# Patient Record
Sex: Female | Born: 1970
Health system: Southern US, Community
[De-identification: ages and names within clinical notes are randomized; demographics above are authoritative.]

## PROBLEM LIST (undated history)

## (undated) DIAGNOSIS — F32A Depression, unspecified: Secondary | ICD-10-CM

## (undated) DIAGNOSIS — K219 Gastro-esophageal reflux disease without esophagitis: Secondary | ICD-10-CM

## (undated) DIAGNOSIS — J45909 Unspecified asthma, uncomplicated: Secondary | ICD-10-CM

## (undated) DIAGNOSIS — T7840XA Allergy, unspecified, initial encounter: Secondary | ICD-10-CM

## (undated) DIAGNOSIS — F329 Major depressive disorder, single episode, unspecified: Secondary | ICD-10-CM

## (undated) DIAGNOSIS — I10 Essential (primary) hypertension: Secondary | ICD-10-CM

## (undated) HISTORY — DX: Major depressive disorder, single episode, unspecified: F32.9

## (undated) HISTORY — DX: Essential (primary) hypertension: I10

## (undated) HISTORY — DX: Gastro-esophageal reflux disease without esophagitis: K21.9

## (undated) HISTORY — DX: Depression, unspecified: F32.A

## (undated) HISTORY — DX: Allergy, unspecified, initial encounter: T78.40XA

## (undated) HISTORY — PX: APPENDECTOMY: SHX54

## (undated) HISTORY — PX: TONSILLECTOMY: SUR1361

## (undated) HISTORY — DX: Unspecified asthma, uncomplicated: J45.909

---

## 2007-03-01 ENCOUNTER — Inpatient Hospital Stay (HOSPITAL_COMMUNITY): Admission: RE | Admit: 2007-03-01 | Discharge: 2007-03-04 | Payer: Self-pay | Admitting: Obstetrics & Gynecology

## 2007-03-02 ENCOUNTER — Encounter (INDEPENDENT_AMBULATORY_CARE_PROVIDER_SITE_OTHER): Payer: Self-pay | Admitting: Specialist

## 2011-03-07 NOTE — Op Note (Signed)
Terri Richardson, Terri Richardson                ACCOUNT NO.:  [id number]   MEDICAL RECORD NO.:  [id number]          PATIENT TYPE:  INP   LOCATION:  9163                          FACILITY:  [facility]   PHYSICIAN:  Genia Del, M.D.DATE OF BIRTH:  1971-06-15   DATE OF PROCEDURE:  DATE OF DISCHARGE:                               OPERATIVE REPORT   PREOPERATIVE DIAGNOSES:  1. Failure to descend.  2. Nonreassuring fetal heart rate at 39 weeks' gestation.  3. Diabetes mellitus type 2 on glyburide.   POSTOPERATIVE DIAGNOSES:  1. Failure to descend.  2. Nonreassuring fetal heart rate at 39 weeks' gestation.  3. Diabetes mellitus type 2 on glyburide.  4. Presentation posterior occiput.   INTERVENTION:  Urgent primary low transverse cesarean section under  epidural.   SURGEON:  Dr. Genia Del.   ANESTHESIOLOGIST:  Dr. Arby Barrette.   PROCEDURE:  Under epidural anesthesia, the patient is in 15-degree left  decubitus position.  She is prepped with Betadine on the abdominal,  suprapubic and vulvar areas.  The bladder catheter is already in place,  and the patient is draped as usual.  Infiltration of Marcaine one-  quarter plain is done at the site of the incision.  We make a  Pfannenstiel incision with the scalpel and opened the adipose tissue  with a scalpel.  We opened the aponeurosis with Mayo scissors  transversely.  We detached the recti muscles from the aponeurosis on the  midline.  We then opened the parietal peritoneum longitudinally with  [facility] scissors.  We put the bladder retractor in place.  We opened the  visceral peritoneum transversely over the lower uterine segment and  reclined the bladder downward.  We reposition the bladder retractor.  We  then make a low transverse hysterotomy with a scalpel, extension on each  side with dressing scissors.  The fetus is in cephalic presentation.  The amniotic fluid is clear.  Birth of a baby boy at 2:59.  The cord is  clamped and cut.  The  baby is suctioned and given to the neonatal team.  Apgars are 9 and 9; pH is 7.33; the weight is 7 pounds 8 ounces.  We  evacuate the placenta spontaneously.  The patient is a cord blood donor.  The pH is done.  We then do a uterine revision.  We start the Pitocin in  the IV fluids, and a dose of Ancef 1 gram IV is given.  The uterus  contracts well.  Both ovaries and both tubes are normal to inspection.  We close the hysterotomy with a first locked running layer of Vicryl 0.  A small extension on the inferior right aspect of the incision was  repaired with a locked running suture of Vicryl 0.  We do a second layer  in a mattress fashion with Vicryl 0.  Hemostasis is good.  We irrigate  and suction the abdominopelvic cavity.  We then complete hemostasis on  the recti muscles where necessary.  We close the aponeurosis in two half  running sutures of Vicryl 0.  We complete  hemostasis on the adipose  tissue and reapproximate the skin with  staples.  The counts of sponges and instruments is complete x2.  A dry  dressing is applied.  The estimated blood loss is 600 mL.  No  complications occurred, and the patient is brought to recovery room in  good stable status.      Genia Del, M.D.  Electronically Signed     ML/MEDQ  D:  03/02/2007  T:  03/02/2007  Job:  [id number]

## 2011-03-10 NOTE — Discharge Summary (Signed)
Terri Richardson, Terri Richardson                ACCOUNT NO.:  [id number]   MEDICAL RECORD NO.:  [id number]          PATIENT TYPE:  INP   LOCATION:  9113                          FACILITY:  [facility]   PHYSICIAN:  Genia Del, M.D.DATE OF BIRTH:  Feb 08, 1971   DATE OF ADMISSION:  03/01/2007  DATE OF DISCHARGE:  03/04/2007                               DISCHARGE SUMMARY   ADMISSION DIAGNOSES:  G1, 38 weeks and 6 days gestation with gestational  diabetes mellitus on Glyburide. Induction with Pitocin and rupture of  membranes. Failure to progress at 6 to 7 cm.   DISCHARGE DIAGNOSES:  G1, 38 weeks and 6 days gestation with gestational  diabetes mellitus on Glyburide. Induction with Pitocin and rupture of  membranes. Failure to progress at 6 to 7 cm.   Birth of a baby boy, 7 pounds and 8 ounces. No complications with the  cesarean section. The estimated blood loss was 600 mL. The patient  remained hemodynamically stable and afebrile. She was discharged home on  postoperative day #2. She was in good stable status. Postoperative  advice was given. She will follow up at the office to remove the staples  and then at 6 weeks postoperatively. Pain medications were prescribed,  and her blood sugars post cesarean section were within normal range.      Genia Del, M.D.  Electronically Signed     ML/MEDQ  D:  04/16/2007  T:  04/16/2007  Job:  [id number]

## 2011-11-10 ENCOUNTER — Inpatient Hospital Stay (HOSPITAL_COMMUNITY)
Admission: EM | Admit: 2011-11-10 | Discharge: 2011-11-12 | DRG: 419 | Disposition: A | Discharge status: Home / Self Care | Payer: Medicaid Other | Attending: General Surgery | Admitting: General Surgery

## 2011-11-10 ENCOUNTER — Encounter (HOSPITAL_COMMUNITY): Payer: Self-pay | Admitting: *Deleted

## 2011-11-10 ENCOUNTER — Emergency Department (HOSPITAL_COMMUNITY): Payer: Medicaid Other

## 2011-11-10 DIAGNOSIS — K819 Cholecystitis, unspecified: Secondary | ICD-10-CM

## 2011-11-10 DIAGNOSIS — K859 Acute pancreatitis without necrosis or infection, unspecified: Secondary | ICD-10-CM

## 2011-11-10 DIAGNOSIS — Z87891 Personal history of nicotine dependence: Secondary | ICD-10-CM

## 2011-11-10 DIAGNOSIS — E119 Type 2 diabetes mellitus without complications: Secondary | ICD-10-CM | POA: Diagnosis present

## 2011-11-10 DIAGNOSIS — K8000 Calculus of gallbladder with acute cholecystitis without obstruction: Principal | ICD-10-CM | POA: Diagnosis present

## 2011-11-10 DIAGNOSIS — K810 Acute cholecystitis: Secondary | ICD-10-CM | POA: Diagnosis present

## 2011-11-10 LAB — URINALYSIS, ROUTINE W REFLEX MICROSCOPIC
Bilirubin Urine: NEGATIVE
Glucose, UA: NEGATIVE mg/dL
Ketones, ur: >80 mg/dL — AB
Leukocytes, UA: NEGATIVE
Nitrite: NEGATIVE
Specific Gravity, Urine: 1.028 (ref 1.005–1.030)
pH: 6 (ref 5.0–8.0)

## 2011-11-10 LAB — COMPREHENSIVE METABOLIC PANEL
Albumin: 4.1 g/dL (ref 3.5–5.2)
Alkaline Phosphatase: 69 U/L (ref 39–117)
BUN: 9 mg/dL (ref 6–23)
Creatinine, Ser: 0.79 mg/dL (ref 0.50–1.10)
GFR calc Af Amer: >90 mL/min (ref >90)
Glucose, Bld: 125 mg/dL — ABNORMAL HIGH (ref 70–99)
Total Bilirubin: 0.8 mg/dL (ref 0.3–1.2)
Total Protein: 8.5 g/dL — ABNORMAL HIGH (ref 6.0–8.3)

## 2011-11-10 LAB — CBC
HCT: 43 % (ref 36.0–46.0)
Hemoglobin: 14.9 g/dL (ref 12.0–15.0)
MCH: 31.2 pg (ref 26.0–34.0)
MCHC: 34.7 g/dL (ref 30.0–36.0)
MCV: 90.1 fL (ref 78.0–100.0)
RBC: 4.77 MIL/uL (ref 3.87–5.11)

## 2011-11-10 LAB — DIFFERENTIAL
Basophils Relative: 0 % (ref 0–1)
Eosinophils Absolute: 0 10*3/uL (ref 0.0–0.7)
Lymphs Abs: 1.7 10*3/uL (ref 0.7–4.0)
Monocytes Absolute: 1.6 10*3/uL — ABNORMAL HIGH (ref 0.1–1.0)
Monocytes Relative: 10 % (ref 3–12)

## 2011-11-10 LAB — URINE MICROSCOPIC-ADD ON

## 2011-11-10 LAB — LIPASE, BLOOD: Lipase: 141 U/L — ABNORMAL HIGH (ref 11–59)

## 2011-11-10 IMAGING — US US ABDOMEN COMPLETE
1 series · 14 of 25 positions shown · non-contrast
Comparison: None

CLINICAL DATA: Right upper quadrant pain.

COMPLETE ABDOMINAL ULTRASOUND

[Series 1: us abdomen complete · 0.30mm/px · 14 of 49 slices shown]
[im 1/49]
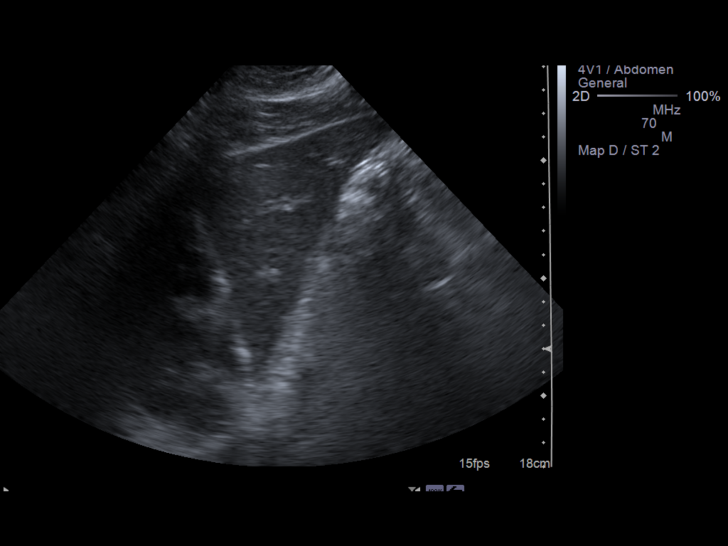
[im 5/49]
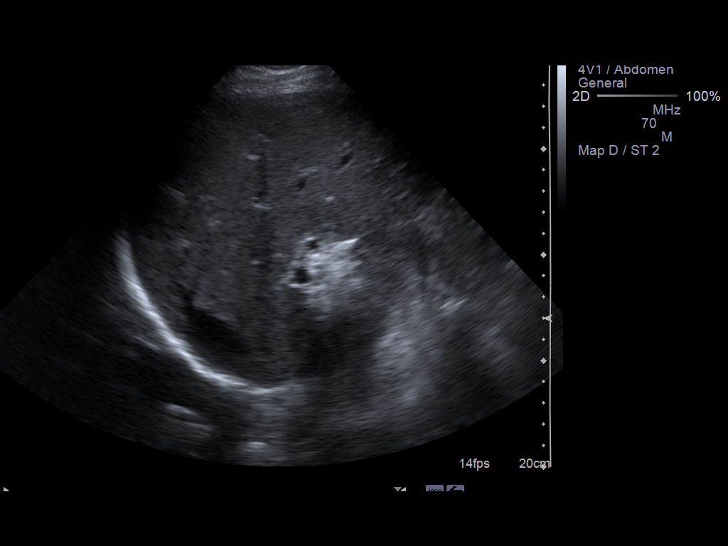
[im 9/49]
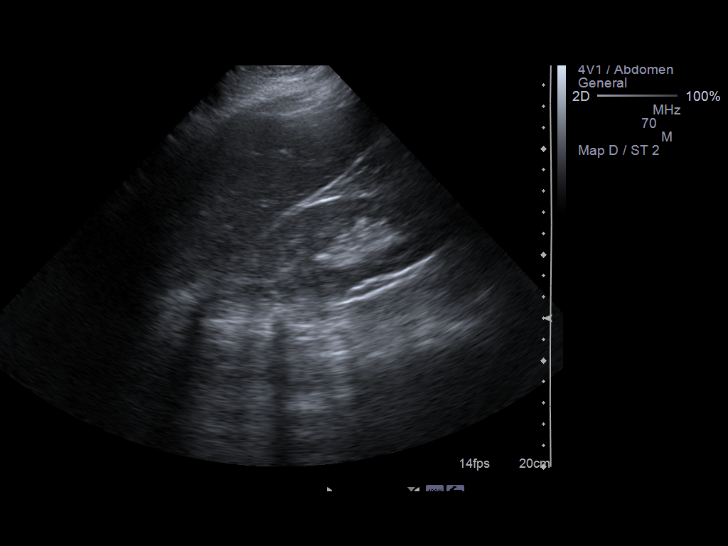
[im 13/49]
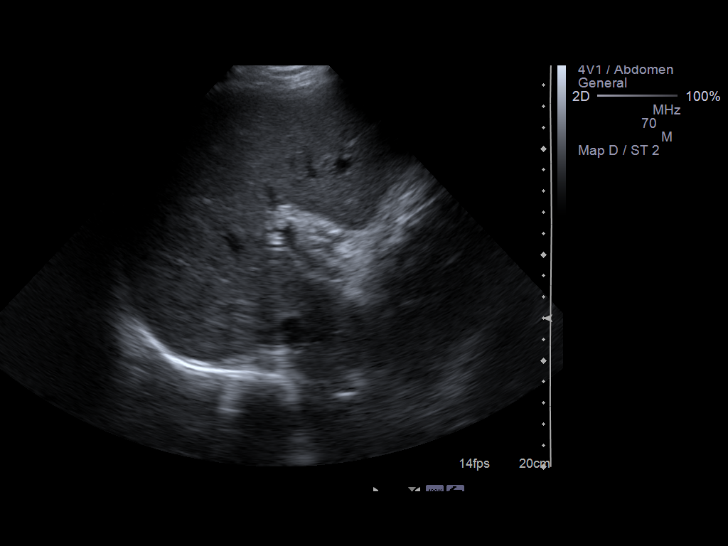
[im 17/49]
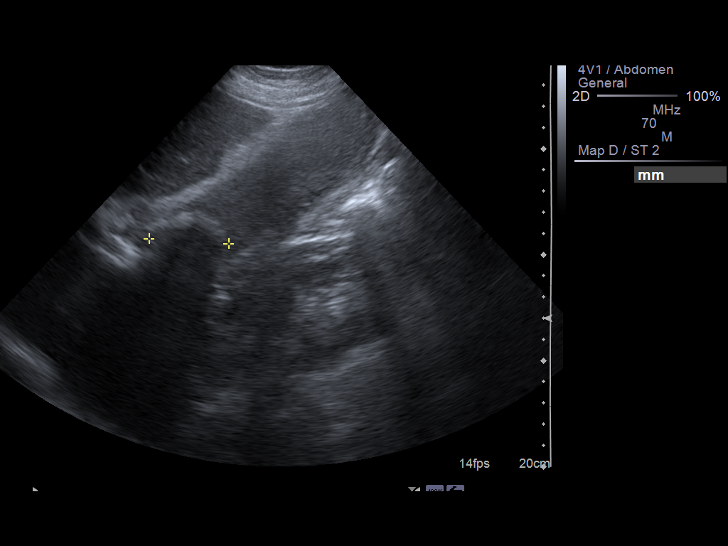
[im 19/49]
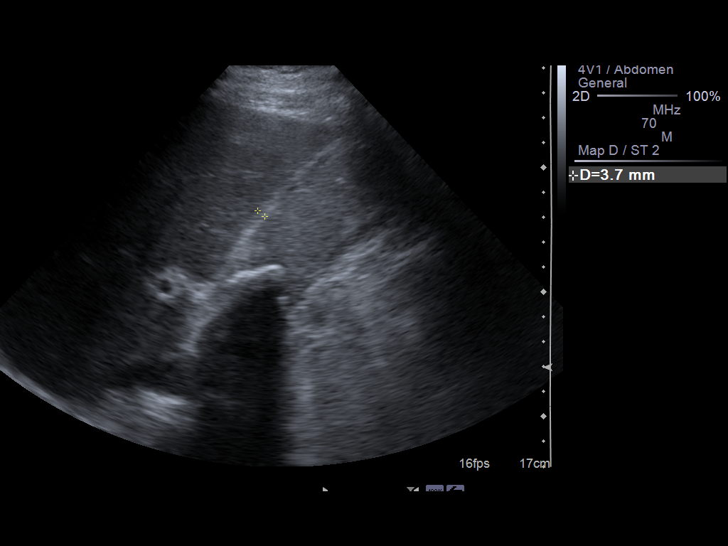
[im 23/49]
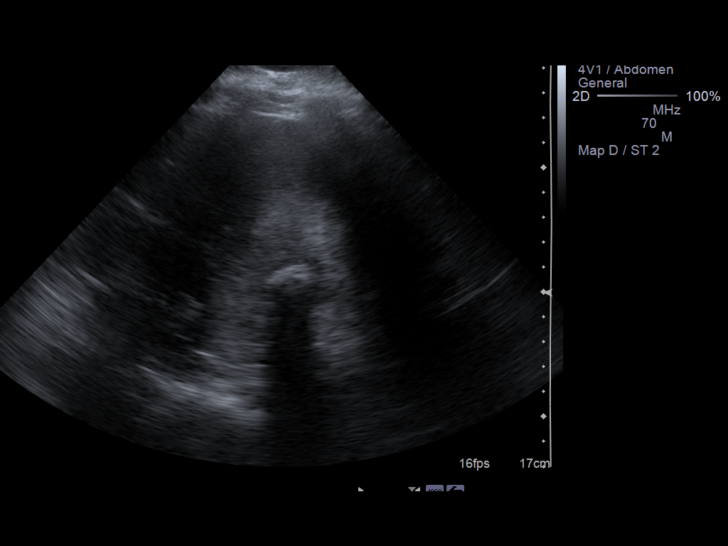
[im 27/49]
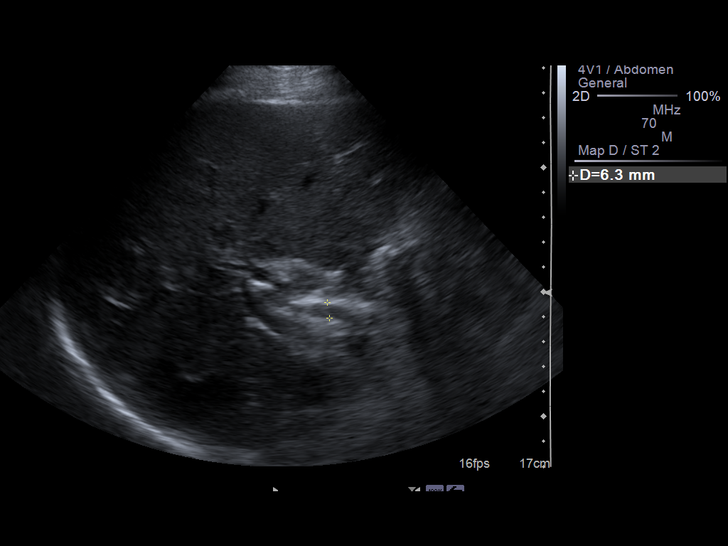
[im 31/49]
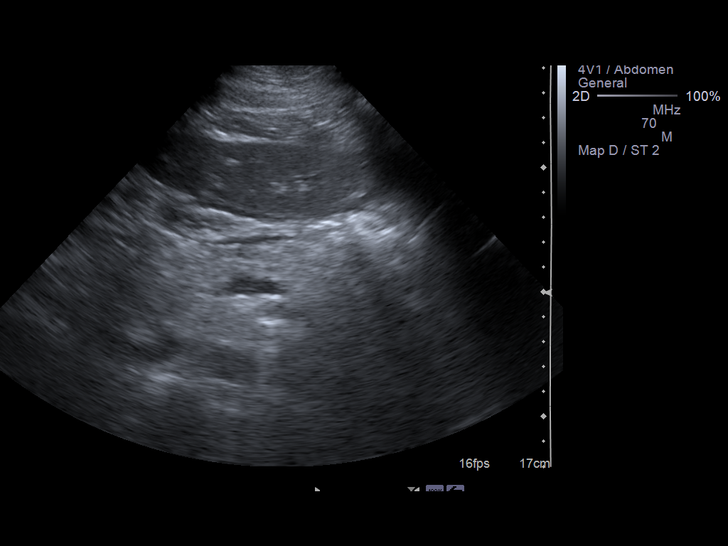
[im 33/49]
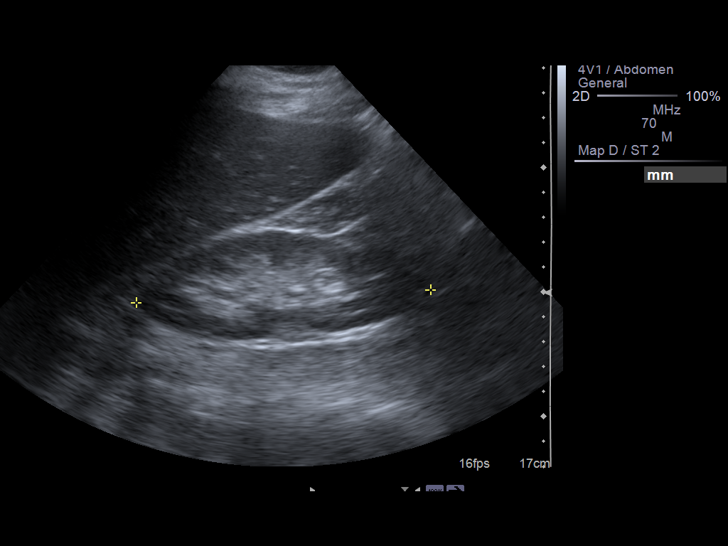
[im 37/49]
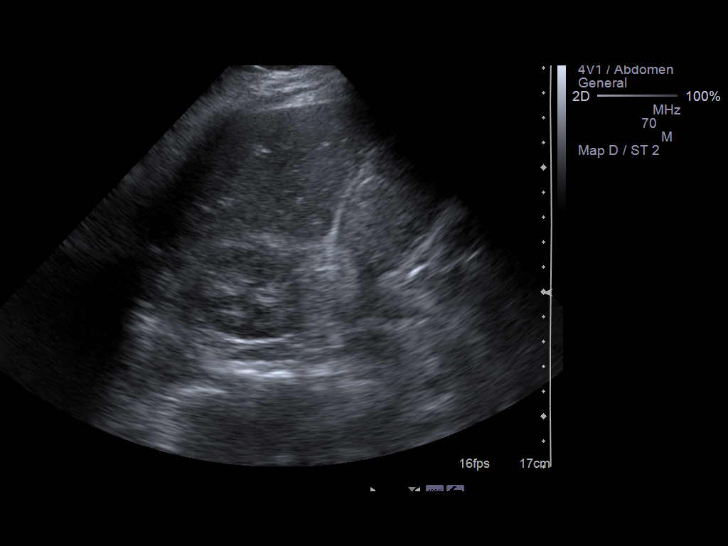
[im 41/49]
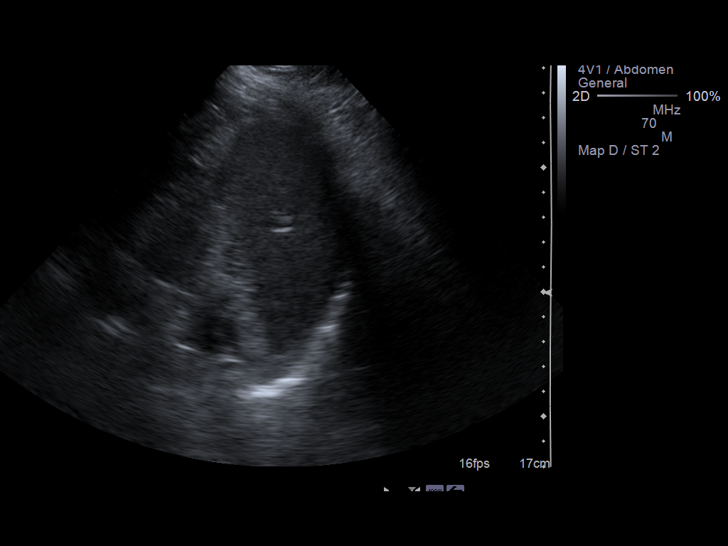
[im 45/49]
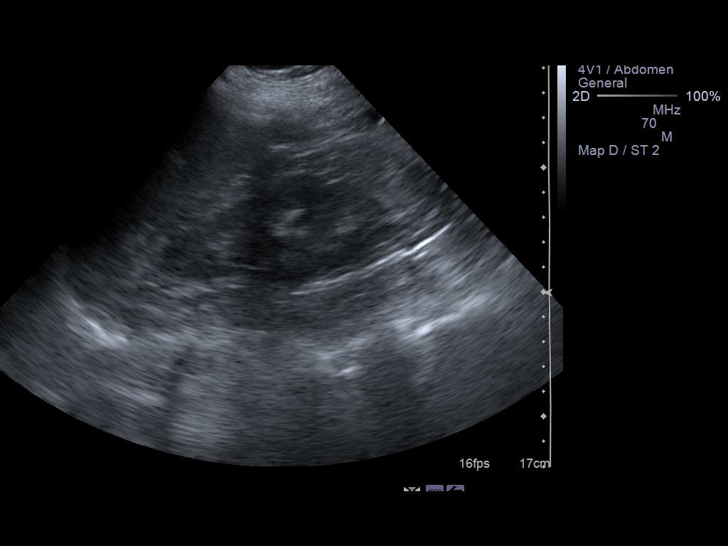
[im 49/49]
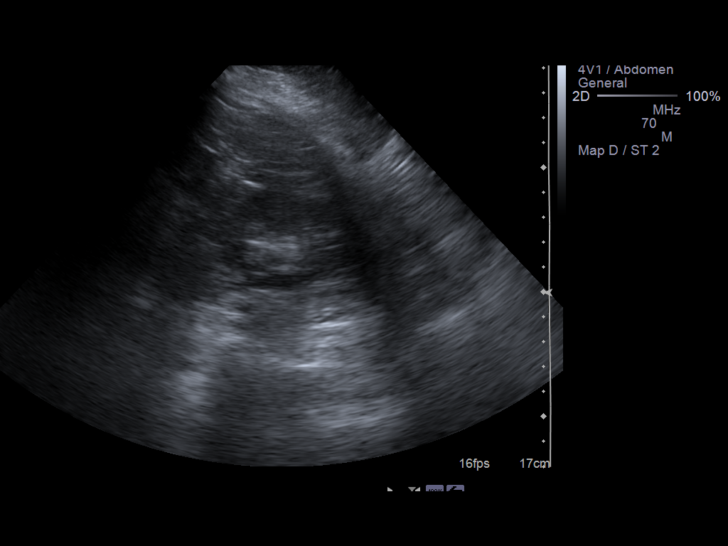

[14 of 25 positions shown; findings below may reference images not displayed]

FINDINGS: Gallbladder:  3.8 cm gallstone is noted in the gallbladder neck
region.  Sludge seen within the gallbladder.  The patient was
tender over the gallbladder during the examination.  Gallbladder
wall thickened at 4 mm.  Findings concerning for acute
cholecystitis.

Common bile duct:   Upper normal at 6 mm.

Liver:  No focal lesion identified.  Within normal limits in
parenchymal echogenicity.

IVC:  Appears normal.

Pancreas:  No focal abnormality seen.

Spleen:  Within normal limits in size and echotexture.

Right Kidney:   Normal in size and parenchymal echogenicity.  No
evidence of mass or hydronephrosis.

Left Kidney:  Normal in size and parenchymal echogenicity.  No
evidence of mass or hydronephrosis.

Abdominal aorta:  No aneurysm identified.
IMPRESSION: 3.8 cm gallstone lodged within the gallbladder neck.  Gallbladder
is filled with sludge.  Mild wall thickening and sonographic
Murphy's sign suggest acute cholecystitis.

## 2011-11-10 MED ORDER — ONDANSETRON HCL 4 MG/2ML IJ SOLN
4.0000 mg | Freq: Once | INTRAMUSCULAR | Status: AC
  Administered 2011-11-10: 4 mg via INTRAVENOUS
  Filled 2011-11-10: qty 2

## 2011-11-10 MED ORDER — HYDROMORPHONE HCL PF 1 MG/ML IJ SOLN
1.0000 mg | Freq: Once | INTRAMUSCULAR | Status: AC
  Administered 2011-11-10: 1 mg via INTRAVENOUS
  Filled 2011-11-10: qty 1

## 2011-11-10 MED ORDER — HYDROMORPHONE HCL PF 1 MG/ML IJ SOLN
1.0000 mg | Freq: Once | INTRAMUSCULAR | Status: AC
  Administered 2011-11-11: 1 mg via INTRAVENOUS
  Filled 2011-11-10: qty 1

## 2011-11-10 MED ORDER — SODIUM CHLORIDE 0.9 % IV SOLN
Freq: Once | INTRAVENOUS | Status: AC
  Administered 2011-11-10: 21:00:00 via INTRAVENOUS

## 2011-11-10 NOTE — ED Notes (Signed)
Patient transported to Ultrasound 

## 2011-11-10 NOTE — ED Provider Notes (Signed)
History     CSN: [id number]  Arrival date & time 11/10/11  2008   First MD Initiated Contact with Patient 11/10/11 2037      Chief Complaint  Patient presents with  . Flank Pain    (Consider location/radiation/quality/duration/timing/severity/associated sxs/prior treatment) Patient is a 41 y.o. female presenting with flank pain. The history is provided by the patient.  Flank Pain  She been having pain in the right flank radiating to the right upper quadrant for the last 2 days. Pain waxed and waned but has gotten much worse today. There's been associated nausea and vomiting. She's also had some constipation. Nothing makes the pain better nothing makes it worse. She denies fever, chills, sweats. She denies dysuria or urinary urgency or frequency. She has not taken anything for the pain. She's never had pain like this before. Pain is sharp and severe and she rates it at 10 out of 10.  Past Medical History  Diagnosis Date  . Diabetes mellitus     History reviewed. No pertinent past surgical history.  History reviewed. No pertinent family history.  History  Substance Use Topics  . Smoking status: Current Everyday Smoker  . Smokeless tobacco: Not on file  . Alcohol Use: Yes    OB History    Grav Para Term Preterm Abortions TAB SAB Ect Mult Living                  Review of Systems  Genitourinary: Positive for flank pain.  All other systems reviewed and are negative.    Allergies  Review of patient's allergies indicates no known allergies.  Home Medications  No current outpatient prescriptions on file.  BP 178/108  Pulse 71  Temp(Src) 98 F (36.7 C) (Oral)  Resp 18  SpO2 98%  LMP 11/09/2010  Physical Exam  Nursing note and vitals reviewed.  41 year old female who is obviously in pain and is restless in the bed. Vital signs show moderate hypertension with blood pressure 178/108. Oxygen saturation is 90% which is normal. Head is normocephalic and atraumatic.  PERRLA, EOMI without scleral icterus. Oropharynx is clear. Neck is supple without adenopathy, JVD, or tenderness. Back is nontender. Lungs are clear without rales, wheezes, rhonchi. Heart has regular rate rhythm without murmur. His no chest wall tenderness. Abdomen is soft, flat, with moderate to severe epigastric and right upper quadrant tenderness with a positive Murphy sign. There is no CVA tenderness. Peristalsis is diminished. Liver edge is palpable 1 cm below the costal margin. Extremities have no cyanosis or edema, full range of motion is present. Skin is warm and moist without rash. Neurologic: Mental status is normal, cranial nerves are intact, there no focal motor or sensory deficits. Psychiatric: There no abnormalities of mood or affect.  ED Course  Procedures (including critical care time)  Results for orders placed during the hospital encounter of 11/10/11  URINALYSIS, ROUTINE W REFLEX MICROSCOPIC      Component Value Range   Color, Urine YELLOW  YELLOW    APPearance CLOUDY (*) CLEAR    Specific Gravity, Urine 1.028  1.005 - 1.030    pH 6.0  5.0 - 8.0    Glucose, UA NEGATIVE  NEGATIVE (mg/dL)   Hgb urine dipstick MODERATE (*) NEGATIVE    Bilirubin Urine NEGATIVE  NEGATIVE    Ketones, ur >80 (*) NEGATIVE (mg/dL)   Protein, ur 295 (*) NEGATIVE (mg/dL)   Urobilinogen, UA 0.2  0.0 - 1.0 (mg/dL)   Nitrite NEGATIVE  NEGATIVE  Leukocytes, UA NEGATIVE  NEGATIVE   POCT PREGNANCY, URINE      Component Value Range   Preg Test, Ur NEGATIVE    CBC      Component Value Range   WBC 16.8 (*) 4.0 - 10.5 (K/uL)   RBC 4.77  3.87 - 5.11 (MIL/uL)   Hemoglobin 14.9  12.0 - 15.0 (g/dL)   HCT 13.0  86.5 - 78.4 (%)   MCV 90.1  78.0 - 100.0 (fL)   MCH 31.2  26.0 - 34.0 (pg)   MCHC 34.7  30.0 - 36.0 (g/dL)   RDW 69.6  29.5 - 28.4 (%)   Platelets 233  150 - 400 (K/uL)  DIFFERENTIAL      Component Value Range   Neutrophils Relative 80 (*) 43 - 77 (%)   Neutro Abs 13.4 (*) 1.7 - 7.7 (K/uL)    Lymphocytes Relative 10 (*) 12 - 46 (%)   Lymphs Abs 1.7  0.7 - 4.0 (K/uL)   Monocytes Relative 10  3 - 12 (%)   Monocytes Absolute 1.6 (*) 0.1 - 1.0 (K/uL)   Eosinophils Relative 0  0 - 5 (%)   Eosinophils Absolute 0.0  0.0 - 0.7 (K/uL)   Basophils Relative 0  0 - 1 (%)   Basophils Absolute 0.0  0.0 - 0.1 (K/uL)  COMPREHENSIVE METABOLIC PANEL      Component Value Range   Sodium 138  135 - 145 (mEq/L)   Potassium 3.5  3.5 - 5.1 (mEq/L)   Chloride 101  96 - 112 (mEq/L)   CO2 22  19 - 32 (mEq/L)   Glucose, Bld 125 (*) 70 - 99 (mg/dL)   BUN 9  6 - 23 (mg/dL)   Creatinine, Ser 1.32  0.50 - 1.10 (mg/dL)   Calcium 9.8  8.4 - 44.0 (mg/dL)   Total Protein 8.5 (*) 6.0 - 8.3 (g/dL)   Albumin 4.1  3.5 - 5.2 (g/dL)   AST 13  0 - 37 (U/L)   ALT 13  0 - 35 (U/L)   Alkaline Phosphatase 69  39 - 117 (U/L)   Total Bilirubin 0.8  0.3 - 1.2 (mg/dL)   GFR calc non Af Amer >90  >90 (mL/min)   GFR calc Af Amer >90  >90 (mL/min)  LIPASE, BLOOD      Component Value Range   Lipase 141 (*) 11 - 59 (U/L)  URINE MICROSCOPIC-ADD ON      Component Value Range   Squamous Epithelial / LPF MANY (*) RARE    WBC, UA 0-2  <3 (WBC/hpf)   RBC / HPF 3-6  <3 (RBC/hpf)   Bacteria, UA FEW (*) RARE    Urine-Other AMORPHOUS URATES/PHOSPHATES     US Abdomen Complete  11/10/2011  *RADIOLOGY REPORT*  Clinical Data:  Right upper quadrant pain.  COMPLETE ABDOMINAL ULTRASOUND  Comparison:  None  Findings:  Gallbladder:  3.8 cm gallstone is noted in the gallbladder neck region.  Sludge seen within the gallbladder.  The patient was tender over the gallbladder during the examination.  Gallbladder wall thickened at 4 mm.  Findings concerning for acute cholecystitis.  Common bile duct:   Upper normal at 6 mm.  Liver:  No focal lesion identified.  Within normal limits in parenchymal echogenicity.  IVC:  Appears normal.  Pancreas:  No focal abnormality seen.  Spleen:  Within normal limits in size and echotexture.  Right Kidney:    Normal in size and parenchymal echogenicity.  No evidence of  mass or hydronephrosis.  Left Kidney:  Normal in size and parenchymal echogenicity.  No evidence of mass or hydronephrosis.  Abdominal aorta:  No aneurysm identified.  IMPRESSION: 3.8 cm gallstone lodged within the gallbladder neck.  Gallbladder is filled with sludge.  Mild wall thickening and sonographic Murphy's sign suggest acute cholecystitis.  Original Report Authenticated By: Cyndie Chime, M.D.      1. Cholecystitis   2. Pancreatitis    She was given IV Dilaudid with good relief of pain, however pain continues to recur and she required 2 additional doses of Dilaudid. Workup is positive for pancreatitis with moderately elevated lipase. Ultrasound showed a large gallstone with sludge, but no common bile duct stones. Liver enzymes are normal. The case is discussed with Dr. Donell Beers control [facility] who agrees to admit the patient.   MDM  Abdominal pain which seems most consistent with biliary colic. She will be given IV fluids, narcotic analgesics, and antiemetics. Ultrasound of the abdomen will be obtained as well as routine laboratory testing.        Dione Booze, MD 11/11/11 [phone]

## 2011-11-10 NOTE — ED Notes (Signed)
The pt has had lt flank pain for 2 days with nv.  No bloody urine.  lmp unkown

## 2011-11-10 NOTE — ED Notes (Signed)
Patient placed on monitor

## 2011-11-10 NOTE — ED Notes (Signed)
Family at bedside. 

## 2011-11-11 ENCOUNTER — Other Ambulatory Visit (INDEPENDENT_AMBULATORY_CARE_PROVIDER_SITE_OTHER): Payer: Self-pay | Admitting: General Surgery

## 2011-11-11 ENCOUNTER — Encounter (HOSPITAL_COMMUNITY): Payer: Self-pay | Admitting: *Deleted

## 2011-11-11 ENCOUNTER — Inpatient Hospital Stay (HOSPITAL_COMMUNITY): Payer: Medicaid Other | Admitting: Anesthesiology

## 2011-11-11 ENCOUNTER — Inpatient Hospital Stay (HOSPITAL_COMMUNITY): Payer: Medicaid Other

## 2011-11-11 ENCOUNTER — Encounter (HOSPITAL_COMMUNITY): Payer: Self-pay | Admitting: Anesthesiology

## 2011-11-11 ENCOUNTER — Encounter (HOSPITAL_COMMUNITY): Admission: EM | Disposition: A | Discharge status: Home / Self Care | Payer: Self-pay | Source: Home / Self Care

## 2011-11-11 DIAGNOSIS — K8010 Calculus of gallbladder with chronic cholecystitis without obstruction: Secondary | ICD-10-CM

## 2011-11-11 DIAGNOSIS — R1011 Right upper quadrant pain: Secondary | ICD-10-CM

## 2011-11-11 DIAGNOSIS — R112 Nausea with vomiting, unspecified: Secondary | ICD-10-CM

## 2011-11-11 HISTORY — PX: CHOLECYSTECTOMY: SHX55

## 2011-11-11 LAB — GLUCOSE, CAPILLARY: Glucose-Capillary: 118 mg/dL — ABNORMAL HIGH (ref 70–99)

## 2011-11-11 LAB — COMPREHENSIVE METABOLIC PANEL
ALT: 352 U/L — ABNORMAL HIGH (ref 0–35)
Calcium: 8.8 mg/dL (ref 8.4–10.5)
GFR calc Af Amer: >90 mL/min (ref >90)
Glucose, Bld: 134 mg/dL — ABNORMAL HIGH (ref 70–99)
Sodium: 139 mEq/L (ref 135–145)
Total Protein: 6.9 g/dL (ref 6.0–8.3)

## 2011-11-11 LAB — CBC
MCH: 30.8 pg (ref 26.0–34.0)
MCHC: 34.1 g/dL (ref 30.0–36.0)
Platelets: 219 10*3/uL (ref 150–400)
RDW: 12.5 % (ref 11.5–15.5)

## 2011-11-11 LAB — SURGICAL PCR SCREEN: Staphylococcus aureus: POSITIVE — AB

## 2011-11-11 IMAGING — RF DG CHOLANGIOGRAM OPERATIVE
1 series · 4 of 4 positions shown · non-contrast
Comparison: Ultrasound [DATE]

CLINICAL DATA: Cholelithiasis.

INTRAOPERATIVE CHOLANGIOGRAM
TECHNIQUE: Cholangiographic images from the C-arm fluoroscopic
device were submitted for interpretation post-operatively.  Please
see the procedural report for the amount of contrast and the
fluoroscopy time utilized.

[Series 1: run · 4 of 46 frames shown]
[frame 1/46]
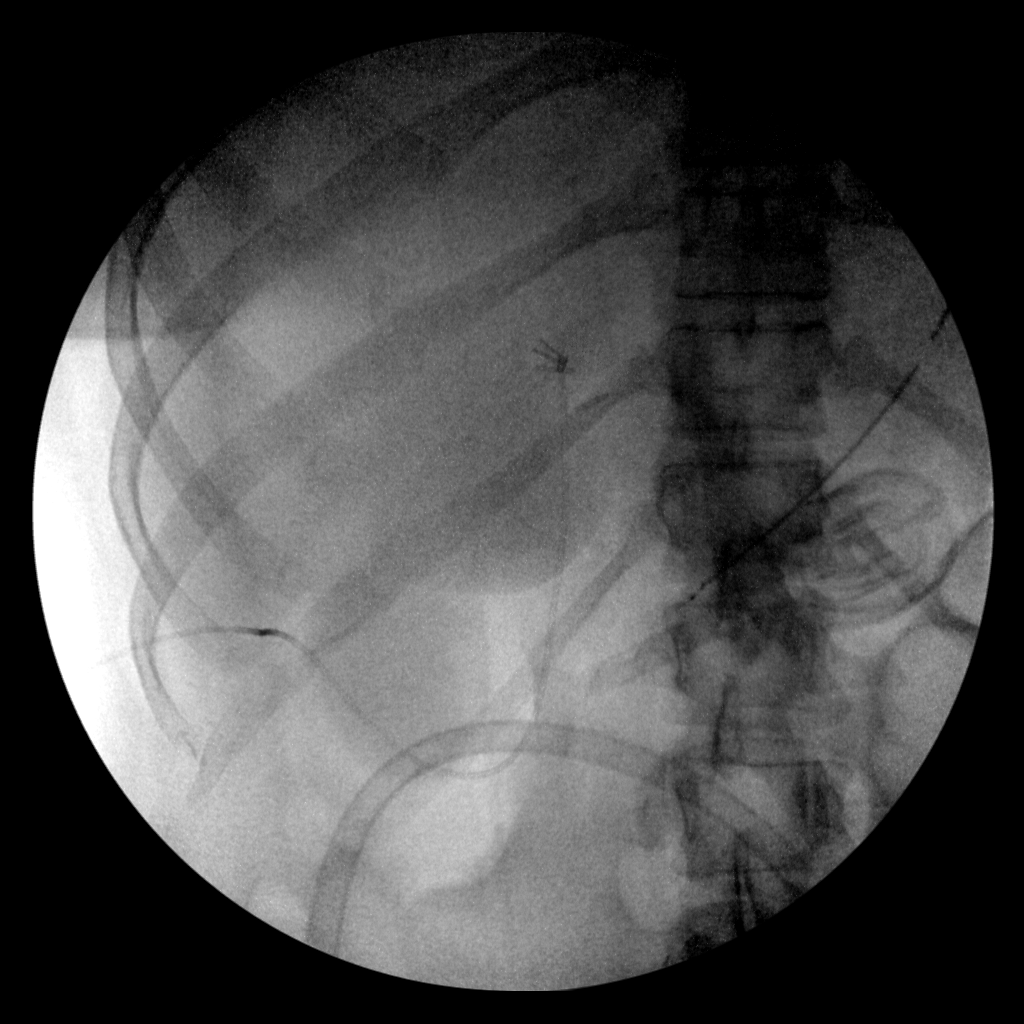
[frame 7/46]
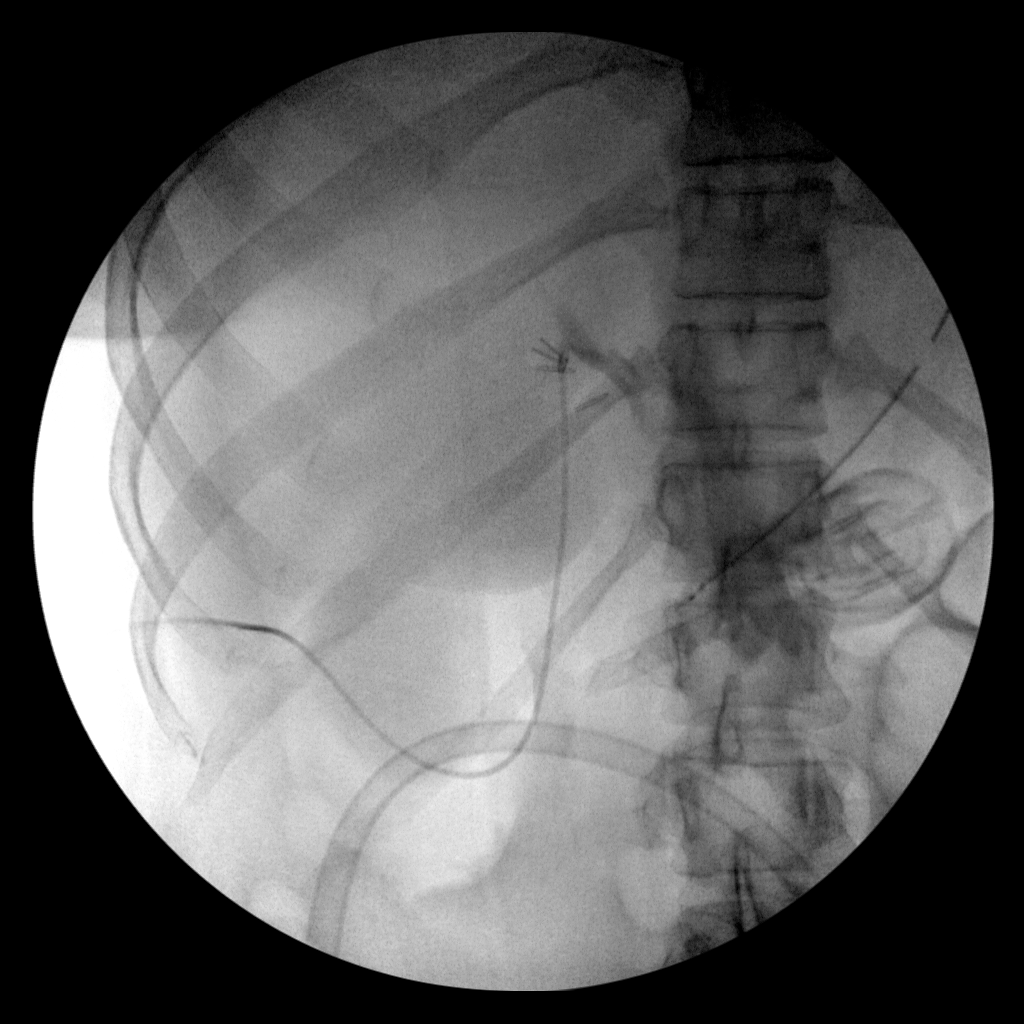
[frame 24/46]
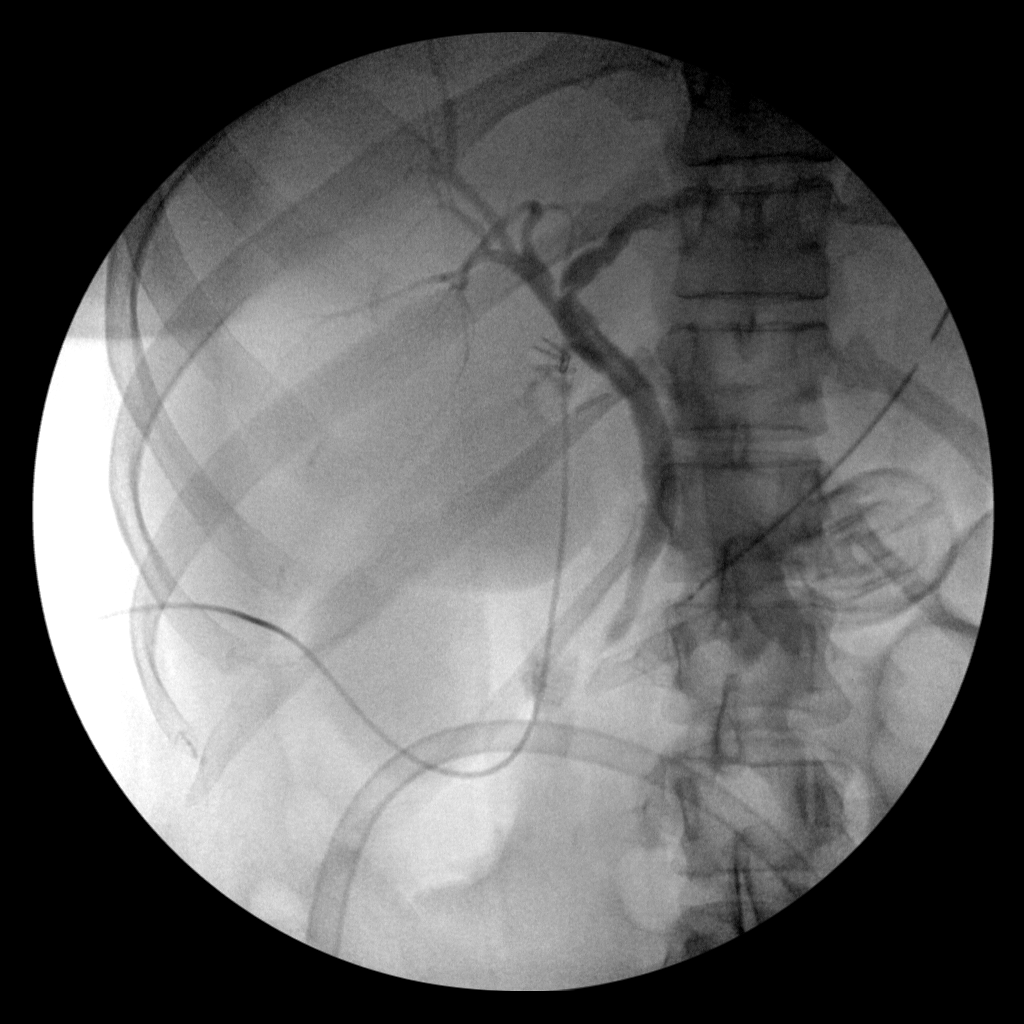
[frame 40/46]
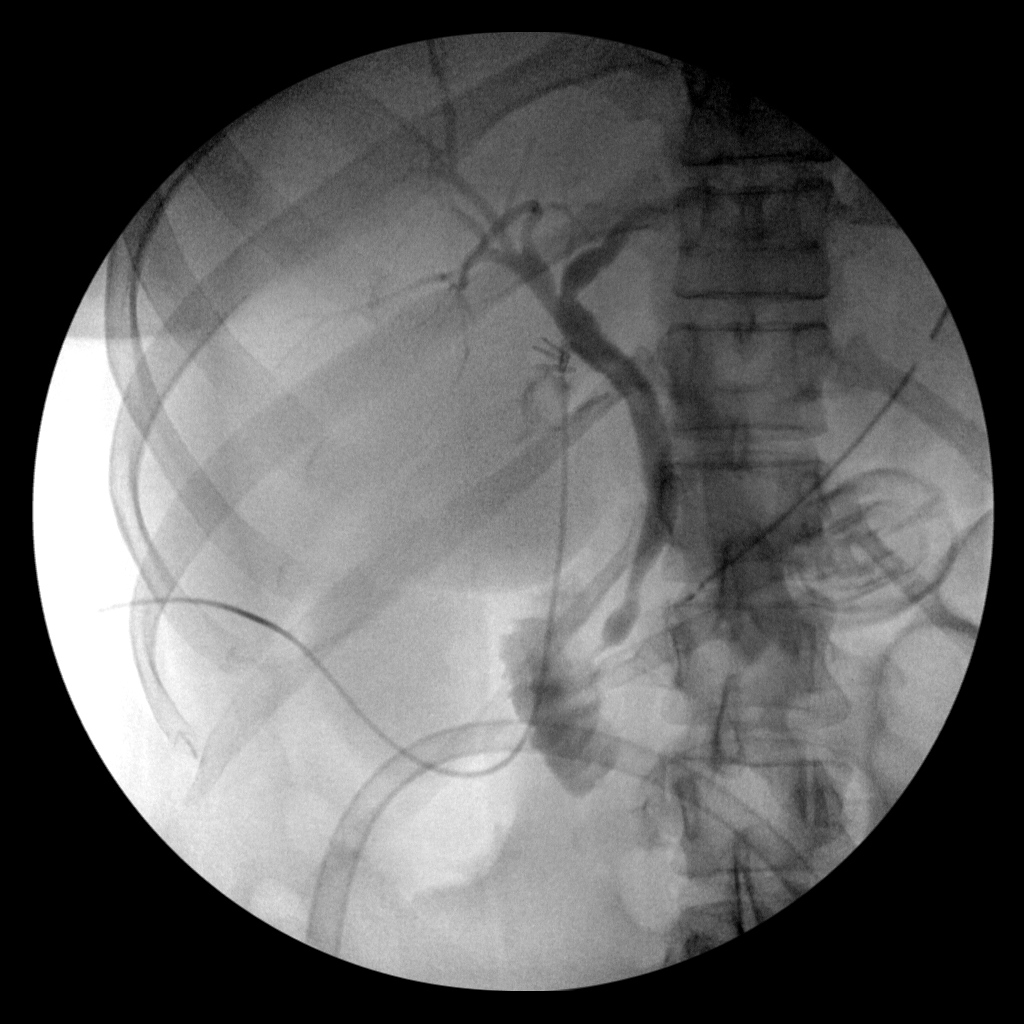

[4 of 4 positions shown; findings below may reference images not displayed]

FINDINGS: There are linear and nonobstructive filling defects
involving the biliary confluence and common hepatic duct.  Filling
defects extend into the common bile duct.  Findings suggest
nonobstructing sludge.  Contrast drains into the duodenum.  No
significant biliary dilatation.
IMPRESSION: Nonobstructive filling defects within the biliary system most
likely represent sludge.

## 2011-11-11 SURGERY — LAPAROSCOPIC CHOLECYSTECTOMY WITH INTRAOPERATIVE CHOLANGIOGRAM
Anesthesia: General | Site: Abdomen | Wound class: Clean Contaminated

## 2011-11-11 MED ORDER — HYDROMORPHONE HCL PF 1 MG/ML IJ SOLN: 1.0000 mg | INTRAMUSCULAR | Status: DC | PRN

## 2011-11-11 MED ORDER — ONDANSETRON HCL 4 MG/2ML IJ SOLN: 4.0000 mg | Freq: Four times a day (QID) | INTRAMUSCULAR | Status: DC | PRN

## 2011-11-11 MED ORDER — MEPERIDINE HCL 25 MG/ML IJ SOLN: 6.2500 mg | INTRAMUSCULAR | Status: DC | PRN

## 2011-11-11 MED ORDER — NEOSTIGMINE METHYLSULFATE 1 MG/ML IJ SOLN
INTRAMUSCULAR | Status: DC | PRN
  Administered 2011-11-11: 5 mg via INTRAVENOUS

## 2011-11-11 MED ORDER — ROCURONIUM BROMIDE 100 MG/10ML IV SOLN
INTRAVENOUS | Status: DC | PRN
  Administered 2011-11-11: 40 mg via INTRAVENOUS

## 2011-11-11 MED ORDER — ENOXAPARIN SODIUM 40 MG/0.4ML ~~LOC~~ SOLN
40.0000 mg | SUBCUTANEOUS | Status: DC
  Administered 2011-11-12: 40 mg via SUBCUTANEOUS
  Filled 2011-11-11: qty 0.4

## 2011-11-11 MED ORDER — ONDANSETRON HCL 4 MG/2ML IJ SOLN
INTRAMUSCULAR | Status: DC | PRN
  Administered 2011-11-11 (×2): 4 mg via INTRAVENOUS

## 2011-11-11 MED ORDER — OXYCODONE-ACETAMINOPHEN 5-325 MG PO TABS
1.0000 | ORAL_TABLET | ORAL | Status: DC | PRN
  Administered 2011-11-11: 1 via ORAL
  Administered 2011-11-12: 2 via ORAL
  Filled 2011-11-11: qty 1
  Filled 2011-11-11: qty 2

## 2011-11-11 MED ORDER — MUPIROCIN 2 % EX OINT
1.0000 "application " | TOPICAL_OINTMENT | Freq: Two times a day (BID) | CUTANEOUS | Status: DC
  Administered 2011-11-11 – 2011-11-12 (×2): 1 via NASAL
  Filled 2011-11-11: qty 22

## 2011-11-11 MED ORDER — PROMETHAZINE HCL 25 MG/ML IJ SOLN: 6.2500 mg | INTRAMUSCULAR | Status: DC | PRN

## 2011-11-11 MED ORDER — ENOXAPARIN SODIUM 40 MG/0.4ML ~~LOC~~ SOLN
40.0000 mg | SUBCUTANEOUS | Status: DC
  Filled 2011-11-11: qty 0.4

## 2011-11-11 MED ORDER — LACTATED RINGERS IV SOLN
INTRAVENOUS | Status: DC | PRN
  Administered 2011-11-11: 11:00:00 via INTRAVENOUS

## 2011-11-11 MED ORDER — SODIUM CHLORIDE 0.9 % IV SOLN
INTRAVENOUS | Status: DC
  Administered 2011-11-11: 02:00:00 via INTRAVENOUS

## 2011-11-11 MED ORDER — SODIUM CHLORIDE 0.9 % IR SOLN
Status: DC | PRN
  Administered 2011-11-11: 1000 mL

## 2011-11-11 MED ORDER — DEXAMETHASONE SODIUM PHOSPHATE 10 MG/ML IJ SOLN
INTRAMUSCULAR | Status: DC | PRN
  Administered 2011-11-11: 12 mg via INTRAVENOUS

## 2011-11-11 MED ORDER — LIDOCAINE HCL (CARDIAC) 20 MG/ML IV SOLN
INTRAVENOUS | Status: DC | PRN
  Administered 2011-11-11: 50 mg via INTRAVENOUS

## 2011-11-11 MED ORDER — SODIUM CHLORIDE 0.9 % IV SOLN
3.0000 g | Freq: Four times a day (QID) | INTRAVENOUS | Status: DC
  Administered 2011-11-11: 3 g via INTRAVENOUS
  Filled 2011-11-11 (×3): qty 3

## 2011-11-11 MED ORDER — HYDROMORPHONE HCL PF 1 MG/ML IJ SOLN: 0.2500 mg | INTRAMUSCULAR | Status: DC | PRN

## 2011-11-11 MED ORDER — LACTATED RINGERS IV SOLN
INTRAVENOUS | Status: DC
  Administered 2011-11-11: 03:00:00 via INTRAVENOUS

## 2011-11-11 MED ORDER — IOHEXOL 300 MG/ML  SOLN
INTRAMUSCULAR | Status: DC | PRN
  Administered 2011-11-11: 8 mL via INTRAVENOUS

## 2011-11-11 MED ORDER — SODIUM CHLORIDE 0.9 % IV SOLN
3.0000 g | Freq: Four times a day (QID) | INTRAVENOUS | Status: AC
  Administered 2011-11-11 – 2011-11-12 (×3): 3 g via INTRAVENOUS
  Filled 2011-11-11 (×3): qty 3

## 2011-11-11 MED ORDER — POTASSIUM CHLORIDE IN NACL 20-0.9 MEQ/L-% IV SOLN
INTRAVENOUS | Status: DC
  Administered 2011-11-11 – 2011-11-12 (×2): via INTRAVENOUS
  Filled 2011-11-11 (×5): qty 1000

## 2011-11-11 MED ORDER — HYDROMORPHONE HCL PF 1 MG/ML IJ SOLN
1.0000 mg | INTRAMUSCULAR | Status: DC | PRN
  Filled 2011-11-11: qty 1

## 2011-11-11 MED ORDER — HYDROMORPHONE HCL PF 1 MG/ML IJ SOLN
0.5000 mg | INTRAMUSCULAR | Status: DC | PRN
  Administered 2011-11-11 (×3): 1 mg via INTRAVENOUS
  Filled 2011-11-11 (×2): qty 1

## 2011-11-11 MED ORDER — ONDANSETRON HCL 4 MG PO TABS: 4.0000 mg | ORAL_TABLET | Freq: Four times a day (QID) | ORAL | Status: DC | PRN

## 2011-11-11 MED ORDER — PROPOFOL 10 MG/ML IV EMUL
INTRAVENOUS | Status: DC | PRN
  Administered 2011-11-11: 150 mg via INTRAVENOUS

## 2011-11-11 MED ORDER — BUPIVACAINE-EPINEPHRINE 0.25% -1:200000 IJ SOLN
INTRAMUSCULAR | Status: DC | PRN
  Administered 2011-11-11: 20 mL

## 2011-11-11 MED ORDER — CHLORHEXIDINE GLUCONATE CLOTH 2 % EX PADS
6.0000 | MEDICATED_PAD | Freq: Every day | CUTANEOUS | Status: DC
  Administered 2011-11-11 – 2011-11-12 (×2): 6 via TOPICAL

## 2011-11-11 MED ORDER — MIDAZOLAM HCL 5 MG/5ML IJ SOLN
INTRAMUSCULAR | Status: DC | PRN
  Administered 2011-11-11: 2 mg via INTRAVENOUS

## 2011-11-11 MED ORDER — DROPERIDOL 2.5 MG/ML IJ SOLN
INTRAMUSCULAR | Status: DC | PRN
  Administered 2011-11-11: 0.625 mg via INTRAVENOUS

## 2011-11-11 MED ORDER — PANTOPRAZOLE SODIUM 40 MG IV SOLR
40.0000 mg | Freq: Every day | INTRAVENOUS | Status: DC
  Filled 2011-11-11: qty 40

## 2011-11-11 MED ORDER — GLYCOPYRROLATE 0.2 MG/ML IJ SOLN
INTRAMUSCULAR | Status: DC | PRN
  Administered 2011-11-11: .6 mg via INTRAVENOUS

## 2011-11-11 MED ORDER — ONDANSETRON HCL 4 MG/2ML IJ SOLN: 4.0000 mg | Freq: Three times a day (TID) | INTRAMUSCULAR | Status: DC | PRN

## 2011-11-11 MED ORDER — SODIUM CHLORIDE 0.9 % IV SOLN
3.0000 g | Freq: Once | INTRAVENOUS | Status: AC
  Administered 2011-11-11 (×2): 3 g via INTRAVENOUS
  Filled 2011-11-11: qty 3

## 2011-11-11 MED ORDER — FENTANYL CITRATE 0.05 MG/ML IJ SOLN
INTRAMUSCULAR | Status: DC | PRN
  Administered 2011-11-11: 50 ug via INTRAVENOUS
  Administered 2011-11-11: 100 ug via INTRAVENOUS
  Administered 2011-11-11: 50 ug via INTRAVENOUS
  Administered 2011-11-11: 100 ug via INTRAVENOUS

## 2011-11-11 SURGICAL SUPPLY — 43 items
APPLIER CLIP 5 13 M/L LIGAMAX5 (MISCELLANEOUS) ×2
APPLIER CLIP ROT 10 11.4 M/L (STAPLE)
APR CLP MED LRG 11.4X10 (STAPLE)
APR CLP MED LRG 5 ANG JAW (MISCELLANEOUS) ×1
BLADE SURG ROTATE 9660 (MISCELLANEOUS) IMPLANT
CANISTER SUCTION 2500CC (MISCELLANEOUS) ×2 IMPLANT
CHLORAPREP W/TINT 26ML (MISCELLANEOUS) ×2 IMPLANT
CLIP APPLIE 5 13 M/L LIGAMAX5 (MISCELLANEOUS) ×1 IMPLANT
CLIP APPLIE ROT 10 11.4 M/L (STAPLE) IMPLANT
CLOTH BEACON ORANGE TIMEOUT ST (SAFETY) ×2 IMPLANT
COVER MAYO STAND STRL (DRAPES) ×2 IMPLANT
COVER SURGICAL LIGHT HANDLE (MISCELLANEOUS) ×2 IMPLANT
DECANTER SPIKE VIAL GLASS SM (MISCELLANEOUS) ×4 IMPLANT
DERMABOND ADVANCED (GAUZE/BANDAGES/DRESSINGS) ×1
DERMABOND ADVANCED .7 DNX12 (GAUZE/BANDAGES/DRESSINGS) ×1 IMPLANT
DRAPE C-ARM 42X72 X-RAY (DRAPES) ×2 IMPLANT
DRAPE UTILITY 15X26 W/TAPE STR (DRAPE) ×4 IMPLANT
DRESSING OPSITE X SMALL 2X3 (GAUZE/BANDAGES/DRESSINGS) ×2 IMPLANT
ELECT REM PT RETURN 9FT ADLT (ELECTROSURGICAL) ×2
ELECTRODE REM PT RTRN 9FT ADLT (ELECTROSURGICAL) ×1 IMPLANT
GLOVE BIOGEL PI IND STRL 8 (GLOVE) ×1 IMPLANT
GLOVE BIOGEL PI INDICATOR 8 (GLOVE) ×1
GLOVE ECLIPSE 7.5 STRL STRAW (GLOVE) ×2 IMPLANT
GOWN STRL NON-REIN LRG LVL3 (GOWN DISPOSABLE) ×4 IMPLANT
KIT BASIN OR (CUSTOM PROCEDURE TRAY) ×2 IMPLANT
KIT ROOM TURNOVER OR (KITS) ×2 IMPLANT
NS IRRIG 1000ML POUR BTL (IV SOLUTION) ×2 IMPLANT
PAD ARMBOARD 7.5X6 YLW CONV (MISCELLANEOUS) ×4 IMPLANT
POUCH SPECIMEN RETRIEVAL 10MM (ENDOMECHANICALS) ×2 IMPLANT
SCISSORS LAP 5X35 DISP (ENDOMECHANICALS) ×2 IMPLANT
SET CHOLANGIOGRAPH 5 50 .035 (SET/KITS/TRAYS/PACK) ×2 IMPLANT
SET IRRIG TUBING LAPAROSCOPIC (IRRIGATION / IRRIGATOR) ×2 IMPLANT
SLEEVE ENDOPATH XCEL 5M (ENDOMECHANICALS) ×4 IMPLANT
SPECIMEN JAR SMALL (MISCELLANEOUS) ×2 IMPLANT
STERI STRIP BROWN 1/4X3 R155 (GAUZE/BANDAGES/DRESSINGS) ×2 IMPLANT
SUT MNCRL AB 4-0 PS2 18 (SUTURE) ×2 IMPLANT
TOWEL OR 17X24 6PK STRL BLUE (TOWEL DISPOSABLE) ×2 IMPLANT
TOWEL OR 17X26 10 PK STRL BLUE (TOWEL DISPOSABLE) ×2 IMPLANT
TRAY LAPAROSCOPIC (CUSTOM PROCEDURE TRAY) ×2 IMPLANT
TROCAR XCEL BLUNT TIP 100MML (ENDOMECHANICALS) ×2 IMPLANT
TROCAR XCEL NON-BLD 11X100MML (ENDOMECHANICALS) IMPLANT
TROCAR XCEL NON-BLD 5MMX100MML (ENDOMECHANICALS) ×2 IMPLANT
WATER STERILE IRR 1000ML POUR (IV SOLUTION) IMPLANT

## 2011-11-11 NOTE — Progress Notes (Signed)
Pt given incentive spirometer and instructed on use. Pt sleepy and has drifted  to 88-86 on occasion  when asleep. Oximetry requested for room. Pt instructed on working on deep breathing exercises. dw

## 2011-11-11 NOTE — Transfer of Care (Signed)
Immediate Anesthesia Transfer of Care Note  Patient: Terri Richardson  Procedure(s) Performed:  LAPAROSCOPIC CHOLECYSTECTOMY WITH INTRAOPERATIVE CHOLANGIOGRAM  Patient Location: PACU  Anesthesia Type: General  Level of Consciousness: sedated and patient cooperative  Airway & Oxygen Therapy: Patient Spontanous Breathing and Patient connected to face mask oxygen  Post-op Assessment: Report given to PACU RN, Post -op Vital signs reviewed and unstable, Anesthesiologist notified and Patient moving all extremities  Post vital signs: Reviewed and stable  Complications: No apparent anesthesia complications2

## 2011-11-11 NOTE — ED Notes (Signed)
ER MD does not want blood cultures.

## 2011-11-11 NOTE — Anesthesia Preprocedure Evaluation (Signed)
Anesthesia Evaluation  Patient identified by MRN, date of birth, ID band Patient awake    Reviewed: Allergy & Precautions, H&P , NPO status , Patient's Chart, lab work & pertinent test results  Airway Mallampati: II TM Distance: >3 FB Neck ROM: Full    Dental No notable dental hx. (+) Teeth Intact   Pulmonary neg pulmonary ROS,  clear to auscultation  Pulmonary exam normal       Cardiovascular neg cardio ROS Regular Normal    Neuro/Psych Negative Neurological ROS  Negative Psych ROS   GI/Hepatic negative GI ROS, Neg liver ROS,   Endo/Other  Diabetes mellitus-, Well Controlled  Renal/GU negative Renal ROS  Genitourinary negative   Musculoskeletal   Abdominal   Peds  Hematology negative hematology ROS (+)   Anesthesia Other Findings   Reproductive/Obstetrics negative OB ROS                           Anesthesia Physical Anesthesia Plan  ASA: II  Anesthesia Plan: General   Post-op Pain Management:    Induction: Intravenous  Airway Management Planned: Oral ETT  Additional Equipment:   Intra-op Plan:   Post-operative Plan: Extubation in OR  Informed Consent: I have reviewed the patients History and Physical, chart, labs and discussed the procedure including the risks, benefits and alternatives for the proposed anesthesia with the patient or authorized representative who has indicated his/her understanding and acceptance.     Plan Discussed with: CRNA  Anesthesia Plan Comments:         Anesthesia Quick Evaluation

## 2011-11-11 NOTE — Progress Notes (Signed)
Pt on oximeter. Awake. Able to maintain O2 sat 92 or greater. On O2 at 4 l/[place]  dw

## 2011-11-11 NOTE — Progress Notes (Signed)
I spoke with the patient and her husband, and they understand that surgery is necessary to remove her inflamed gallbladder.  We will proceed later this morning or early afternoon.  Terri Richardson. Gae Bon, MD, FACS [phone] [facility]

## 2011-11-11 NOTE — Preoperative (Signed)
Beta Blockers   Reason not to administer Beta Blockers:Not Applicable 

## 2011-11-11 NOTE — H&P (Signed)
Terri Richardson is an 41 y.o. female.   Chief Complaint: Abdominal pain HPI:  Pt is 41 year old female who presents with 48 hours of RUQ/flank pain, severe and stabbing in nature.  She has had nausea/vomiting and anorexia.  She denies fevers/ chills.  IV narcotics briefly relieve pain, moving exacerbates pain.  She has not ever had pain like this before.  She denies intermittent abdominal pain.  She has had occasional reflux and dyspepsia.  She denies jaundice or acholic stools.    Past Medical History  Diagnosis Date  . Diabetes mellitus     Past Surgical History  Procedure Date  . Cesarean section 2008  . Tonsillectomy 6th grade  . Appendectomy 1st grade    History reviewed. No pertinent family history. Social History:  reports that she quit smoking 4 days ago. Her smoking use included Cigarettes. She quit after 2 years of use. She does not have any smokeless tobacco history on file. She reports that she does not drink alcohol or use illicit drugs.  Allergies: No Known Allergies  Medications Prior to Admission  Medication Dose Route Frequency Provider Last Rate Last Dose  . 0.9 %  sodium chloride infusion   Intravenous Once Dione Booze, MD 150 mL/hr at 11/10/11 2115    . 0.9 %  sodium chloride infusion   Intravenous STAT Dione Booze, MD 125 mL/hr at 11/11/11 0212    . Ampicillin-Sulbactam (UNASYN) 3 g in sodium chloride 0.9 % 100 mL IVPB  3 g Intravenous Once Dione Booze, MD   3 g at 11/11/11 0043  . Ampicillin-Sulbactam (UNASYN) 3 g in sodium chloride 0.9 % 100 mL IVPB  3 g Intravenous Q6H Almond Lint, MD   3 g at 11/11/11 0546  . HYDROmorphone (DILAUDID) injection 0.5-2 mg  0.5-2 mg Intravenous Q2H PRN Almond Lint, MD   1 mg at 11/11/11 0600  . HYDROmorphone (DILAUDID) injection 1 mg  1 mg Intravenous Once Dione Booze, MD   1 mg at 11/10/11 2118  . HYDROmorphone (DILAUDID) injection 1 mg  1 mg Intravenous Once Dione Booze, MD   1 mg at 11/10/11 2238  . HYDROmorphone (DILAUDID)  injection 1 mg  1 mg Intravenous Once Dione Booze, MD   1 mg at 11/11/11 0047  . HYDROmorphone (DILAUDID) injection 1 mg  1 mg Intravenous Q2H PRN Dione Booze, MD      . lactated ringers infusion   Intravenous Continuous Almond Lint, MD 100 mL/hr at 11/11/11 0257    . ondansetron (ZOFRAN) injection 4 mg  4 mg Intravenous Once Dione Booze, MD   4 mg at 11/10/11 2117  . ondansetron (ZOFRAN) injection 4 mg  4 mg Intravenous Q8H PRN Dione Booze, MD      . ondansetron [facility]) injection 4 mg  4 mg Intravenous Q6H PRN Almond Lint, MD       No current outpatient prescriptions on file as of 11/11/2011.    Results for orders placed during the hospital encounter of 11/10/11 (from the past 48 hour(s))  URINALYSIS, ROUTINE W REFLEX MICROSCOPIC     Status: Abnormal   Collection Time   11/10/11  8:32 PM      Component Value Range Comment   Color, Urine YELLOW  YELLOW     APPearance CLOUDY (*) CLEAR     Specific Gravity, Urine 1.028  1.005 - 1.030     pH 6.0  5.0 - 8.0     Glucose, UA NEGATIVE  NEGATIVE (mg/dL)  Hgb urine dipstick MODERATE (*) NEGATIVE     Bilirubin Urine NEGATIVE  NEGATIVE     Ketones, ur >80 (*) NEGATIVE (mg/dL)    Protein, ur 409 (*) NEGATIVE (mg/dL)    Urobilinogen, UA 0.2  0.0 - 1.0 (mg/dL)    Nitrite NEGATIVE  NEGATIVE     Leukocytes, UA NEGATIVE  NEGATIVE    URINE MICROSCOPIC-ADD ON     Status: Abnormal   Collection Time   11/10/11  8:32 PM      Component Value Range Comment   Squamous Epithelial / LPF MANY (*) RARE     WBC, UA 0-2  <3 (WBC/hpf)    RBC / HPF 3-6  <3 (RBC/hpf)    Bacteria, UA FEW (*) RARE     Urine-Other AMORPHOUS URATES/PHOSPHATES     POCT PREGNANCY, URINE     Status: Normal   Collection Time   11/10/11  8:39 PM      Component Value Range Comment   Preg Test, Ur NEGATIVE     CBC     Status: Abnormal   Collection Time   11/10/11  9:08 PM      Component Value Range Comment   WBC 16.8 (*) 4.0 - 10.5 (K/uL)    RBC 4.77  3.87 - 5.11 (MIL/uL)     Hemoglobin 14.9  12.0 - 15.0 (g/dL)    HCT 81.1  91.4 - 78.2 (%)    MCV 90.1  78.0 - 100.0 (fL)    MCH 31.2  26.0 - 34.0 (pg)    MCHC 34.7  30.0 - 36.0 (g/dL)    RDW 95.6  21.3 - 08.6 (%)    Platelets 233  150 - 400 (K/uL)   DIFFERENTIAL     Status: Abnormal   Collection Time   11/10/11  9:08 PM      Component Value Range Comment   Neutrophils Relative 80 (*) 43 - 77 (%)    Neutro Abs 13.4 (*) 1.7 - 7.7 (K/uL)    Lymphocytes Relative 10 (*) 12 - 46 (%)    Lymphs Abs 1.7  0.7 - 4.0 (K/uL)    Monocytes Relative 10  3 - 12 (%)    Monocytes Absolute 1.6 (*) 0.1 - 1.0 (K/uL)    Eosinophils Relative 0  0 - 5 (%)    Eosinophils Absolute 0.0  0.0 - 0.7 (K/uL)    Basophils Relative 0  0 - 1 (%)    Basophils Absolute 0.0  0.0 - 0.1 (K/uL)   COMPREHENSIVE METABOLIC PANEL     Status: Abnormal   Collection Time   11/10/11  9:08 PM      Component Value Range Comment   Sodium 138  135 - 145 (mEq/L)    Potassium 3.5  3.5 - 5.1 (mEq/L)    Chloride 101  96 - 112 (mEq/L)    CO2 22  19 - 32 (mEq/L)    Glucose, Bld 125 (*) 70 - 99 (mg/dL)    BUN 9  6 - 23 (mg/dL)    Creatinine, Ser 5.78  0.50 - 1.10 (mg/dL)    Calcium 9.8  8.4 - 10.5 (mg/dL)    Total Protein 8.5 (*) 6.0 - 8.3 (g/dL)    Albumin 4.1  3.5 - 5.2 (g/dL)    AST 13  0 - 37 (U/L)    ALT 13  0 - 35 (U/L)    Alkaline Phosphatase 69  39 - 117 (U/L)    Total Bilirubin 0.8  0.3 - 1.2 (mg/dL)    GFR calc non Af Amer >90  >90 (mL/min)    GFR calc Af Amer >90  >90 (mL/min)   LIPASE, BLOOD     Status: Abnormal   Collection Time   11/10/11  9:08 PM      Component Value Range Comment   Lipase 141 (*) 11 - 59 (U/L)   GLUCOSE, CAPILLARY     Status: Abnormal   Collection Time   11/11/11  4:11 AM      Component Value Range Comment   Glucose-Capillary 134 (*) 70 - 99 (mg/dL)    Comment 1 Documented in Chart      Comment 2 Notify RN      US Abdomen Complete  11/10/2011  *RADIOLOGY REPORT*  Clinical Data:  Right upper quadrant pain.  COMPLETE  ABDOMINAL ULTRASOUND  Comparison:  None  Findings:  Gallbladder:  3.8 cm gallstone is noted in the gallbladder neck region.  Sludge seen within the gallbladder.  The patient was tender over the gallbladder during the examination.  Gallbladder wall thickened at 4 mm.  Findings concerning for acute cholecystitis.  Common bile duct:   Upper normal at 6 mm.  Liver:  No focal lesion identified.  Within normal limits in parenchymal echogenicity.  IVC:  Appears normal.  Pancreas:  No focal abnormality seen.  Spleen:  Within normal limits in size and echotexture.  Right Kidney:   Normal in size and parenchymal echogenicity.  No evidence of mass or hydronephrosis.  Left Kidney:  Normal in size and parenchymal echogenicity.  No evidence of mass or hydronephrosis.  Abdominal aorta:  No aneurysm identified.  IMPRESSION: 3.8 cm gallstone lodged within the gallbladder neck.  Gallbladder is filled with sludge.  Mild wall thickening and sonographic Murphy's sign suggest acute cholecystitis.  Original Report Authenticated By: Cyndie Chime, M.D.    Review of Systems  Constitutional: Negative.   HENT: Negative.   Eyes: Negative.   Respiratory: Negative.   Cardiovascular: Negative.   Gastrointestinal: Positive for heartburn, nausea, vomiting and abdominal pain.  Genitourinary: Negative.   Musculoskeletal: Negative.   Skin: Negative.   Neurological: Negative.   Endo/Heme/Allergies: Negative.   Psychiatric/Behavioral: Negative.   All other systems reviewed and are negative.    Blood pressure 151/84, pulse 93, temperature 98.8 F (37.1 C), temperature source Oral, resp. rate 20, height 5\' 2"  (1.575 m), weight 165 lb 12.8 oz (75.206 kg), SpO2 96.00%. Physical Exam  Constitutional: She appears well-developed and well-nourished. She appears distressed.  HENT:  Head: Normocephalic and atraumatic.  Eyes: Conjunctivae are normal. Pupils are equal, round, and reactive to light. No scleral icterus.  Neck: Normal range  of motion. Neck supple. No tracheal deviation present. No thyromegaly present.  Cardiovascular: Normal rate, regular rhythm, normal heart sounds and intact distal pulses.  Exam reveals no gallop and no friction rub.   No murmur heard. Respiratory: Effort normal. No respiratory distress. She has no wheezes. She has no rales. She exhibits no tenderness.  GI: Soft. She exhibits no distension and no mass. There is tenderness (very tender RUQ). There is guarding (voluntary guarding RUQ). There is no rebound.  Musculoskeletal: Normal range of motion. She exhibits no edema and no tenderness.  Lymphadenopathy:    She has no cervical adenopathy.  Neurological: She is alert. Coordination normal.  Skin: Skin is warm and dry. No rash noted. She is not diaphoretic. No erythema. No pallor.  Psychiatric: She has a normal mood and affect.  Her behavior is normal. Judgment and thought content normal.     Assessment/Plan Cholecystitis  IV Fluid resuscitation  IV antibiotics  NPO  Lap chole if possible.     The surgical procedure was described to the patient in detail.  The patient was given Agricultural engineer. .  I discussed the incision type and location, the location of the gallbladder, the anatomy of the bile ducts and arteries, and the typical progression of surgery.  I discussed the possibility of converting to an open operation.  I advised of the risks of bleeding, infection, damage to other structures (such as the bile duct, intestine or liver), bile leak, need for other procedures or surgeries, and post op diarrhea/constipation.       Khyree Carillo 11/11/2011, 7:10 AM

## 2011-11-11 NOTE — Progress Notes (Signed)
Lovenox order verified, to be started tomorrow 11/12/11 @1100  per order

## 2011-11-11 NOTE — ED Notes (Signed)
Called and gave report to Jessica. 

## 2011-11-11 NOTE — Anesthesia Procedure Notes (Signed)
Procedure Name: Intubation Date/Time: 11/11/2011 11:00 AM Performed by: Malachi Pro Pre-anesthesia Checklist: Patient identified, Emergency Drugs available, Suction available, Patient being monitored and Timeout performed Oxygen Delivery Method: Circle System Utilized Preoxygenation: Pre-oxygenation with 100% oxygen Intubation Type: IV induction Ventilation: Mask ventilation without difficulty Laryngoscope Size: Mac and 3 Grade View: Grade II Tube type: Oral Tube size: 7.5 mm Number of attempts: 1 Airway Equipment and Method: stylet Placement Confirmation: ETT inserted through vocal cords under direct vision,  positive ETCO2 and breath sounds checked- equal and bilateral Secured at: 23 cm Tube secured with: Tape Dental Injury: Teeth and Oropharynx as per pre-operative assessment

## 2011-11-11 NOTE — Anesthesia Postprocedure Evaluation (Signed)
  Anesthesia Post-op Note  Patient: Terri Richardson  Procedure(s) Performed:  LAPAROSCOPIC CHOLECYSTECTOMY WITH INTRAOPERATIVE CHOLANGIOGRAM  Patient Location: PACU  Anesthesia Type: General  Level of Consciousness: awake and alert   Airway and Oxygen Therapy: Patient Spontanous Breathing  Post-op Pain: mild  Post-op Assessment: Post-op Vital signs reviewed, Patient's Cardiovascular Status Stable, Respiratory Function Stable, Patent Airway and No signs of Nausea or vomiting  Post-op Vital Signs: Reviewed and stable  Complications: No apparent anesthesia complications

## 2011-11-11 NOTE — Op Note (Signed)
OPERATIVE REPORT  DATE OF OPERATION: 11/10/2011 - 11/11/2011  PATIENT:  Terri Richardson  41 y.o. female  PRE-OPERATIVE DIAGNOSIS:  cholecystitis  POST-OPERATIVE DIAGNOSIS:  cholecystitis  PROCEDURE:  Procedure(s): LAPAROSCOPIC CHOLECYSTECTOMY WITH INTRAOPERATIVE CHOLANGIOGRAM  SURGEON:  Surgeon(s): Cherylynn Ridges III, MD  ASSISTANT: None  ANESTHESIA:   general  EBL: 50 ml  BLOOD ADMINISTERED: none  DRAINS: none   SPECIMEN:  Source of Specimen:  Gallbladder and large stones  COUNTS CORRECT:  YES  PROCEDURE DETAILS:  The patient was taken to the operating room and placed on the table in the supine position.  After an adequate endotracheal anesthetic was administered, she was prepped with ChloroPrep, and then draped in the usual manner exposing the entire abdomen laterally, inferiorly and up  to the costal margins.  After a proper timeout was performed including identifying the patient and the procedure to be performed, a supraumbilical1.5cm midline incision was made using a #15 blade.  This was taken down to the fascia which was then incised with a #15 blade.  The edges of the fascia were tented up with Kocher clamps as the preperitoneal space was penetrated with a Kelly clamp into the peritoneum.  Once this was done, a pursestring suture of 0 Vicryl was passed around the fascial opening.  This was subsequently used to secure the [facility] cannula which was passed into the peritoneal cavity.  Once the [facility] cannula was in place, carbon dioxide gas was insufflated into the peritoneal cavity up to a maximal intra-abdominal pressure of 15mm Hg.The laparoscope, with attached camera and light source, was passed into the peritoneal cavity to visualize the direct insertion of two right upper quadrant 5mm cannulas, and a sup-xiphoid 5mm cannula.  Once all cannulas were in place, the dissection was begun.  The gall bladder was tensely distended and needed to be decompressed with a Insurance risk surveyor.  Thick bilious contents were aspirated.  Two ratcheted graspers were attached to the dome and infundibulum of the gallbladder and retracted towards the anterior abdominal wall and the right upper quadrant.  Using cautery attached to a dissecting forceps, the peritoneum overlaying the triangle of Chalot and the hepatoduodenal triangle was dissected away exposing the cystic duct and the cystic artery.  The cystic duct was short and formed the lateral aspect of the triangle of Calot.  The cystic artery was about 6mm in size and was clipped.  A clip was placed on the gallbladder side of the cystic duct, then a cholecytodochotomy made using the laparoscopic scissors.  Through the cholecystodochotomy a Cook catheter was passed to performed a cholangiogram.  The cholangiogram showed good flow into the duodenum, good proximal filling, no intraductal filling defects, and no dilatation..  Once the cholangiogram was completed, the [facility] catheter was removed, and the distal cystic duct was clipped multiple times then transected.  The gallbladder was then dissected out of the hepatic bed without event.  It was retrieved from the abdomen (using an EndoCatch bag) without event.  The gallbladder wall was very edematous and necrotic in the hepatic bed.  Once the gallbladder was removed, the bed was inspected for hemostasis.  Once excellent hemostasis was obtained all gas and fluids were aspirated from above the liver, then the cannulas were removed.  The supraumbilical incision was closed using the pursestring suture which was in place.  0.25% bupivicaine with epinephrine was injected at all sites.  All 10mm or greater cannula sites were close using a running subcuticular stitch of 4-0 Monocryl.  5.67mm cannula sites were closed with Dermabond only.Steri-Strips and Tagaderm were used to complete the dressings at all sites.  At this point all needle, sponge, and instrument counts were correct.The patient was  awakened from anesthesia and taken to the PACU in stable condition.  PATIENT DISPOSITION:  PACU - hemodynamically stable.   Jasiyah Paulding III,Kinnedy Mongiello O 1/19/201312:23 PM

## 2011-11-12 DIAGNOSIS — K810 Acute cholecystitis: Secondary | ICD-10-CM | POA: Diagnosis present

## 2011-11-12 LAB — COMPREHENSIVE METABOLIC PANEL
AST: 89 U/L — ABNORMAL HIGH (ref 0–37)
Albumin: 3 g/dL — ABNORMAL LOW (ref 3.5–5.2)
Alkaline Phosphatase: 135 U/L — ABNORMAL HIGH (ref 39–117)
BUN: 16 mg/dL (ref 6–23)
CO2: 21 mEq/L (ref 19–32)
Chloride: 108 mEq/L (ref 96–112)
Potassium: 3.5 mEq/L (ref 3.5–5.1)
Total Bilirubin: 0.9 mg/dL (ref 0.3–1.2)

## 2011-11-12 LAB — GLUCOSE, CAPILLARY: Glucose-Capillary: 110 mg/dL — ABNORMAL HIGH (ref 70–99)

## 2011-11-12 MED ORDER — OXYCODONE-ACETAMINOPHEN 5-325 MG PO TABS: 1.0000 | ORAL_TABLET | ORAL | Status: AC | PRN

## 2011-11-12 NOTE — Progress Notes (Signed)
1 Day Post-Op  Subjective: Pt ok. Sore, no N/V. Pain better than pre-op  Objective: Vital signs in last 24 hours: Temp:  [97.8 F (36.6 C)-98.7 F (37.1 C)] 98.6 F (37 C) (01/20 0528) Pulse Rate:  [53-108] 57  (01/20 0528) Resp:  [16-24] 18  (01/20 0528) BP: (118-142)/(68-95) 132/84 mmHg (01/20 0528) SpO2:  [85 %-98 %] 95 % (01/20 0528) FiO2 (%):  [2 %] 2 % (01/19 1345) Last BM Date: 11/10/11  Intake/Output this shift:    Physical Exam: BP 132/84  Pulse 57  Temp(Src) 98.6 F (37 C) (Oral)  Resp 18  Ht 5\' 2"  (1.575 m)  Wt 75.206 kg (165 lb 12.8 oz)  BMI 30.33 kg/m2  SpO2 95% Lungs: CTA without w/r/r Heart: Regular Abdomen: soft, ND, appropriately tender   Incisions all c/d/i without erythema or hematoma. Ext: No edema or tenderness   Labs: CBC  Basename 11/11/11 0913 11/10/11 2108  WBC 12.2* 16.8*  HGB 13.1 14.9  HCT 38.4 43.0  PLT 219 233   BMET  Basename 11/11/11 0913 11/10/11 2108  NA 139 138  K 3.5 3.5  CL 105 101  CO2 24 22  GLUCOSE 134* 125*  BUN 8 9  CREATININE 0.66 0.79  CALCIUM 8.8 9.8   LFT  Basename 11/11/11 0913  PROT 6.9  ALBUMIN 3.1*  AST 401*  ALT 352*  ALKPHOS 150*  BILITOT 3.2*  BILIDIR --  IBILI --  LIPASE 95*   PT/INR No results found for this basename: LABPROT:2,INR:2 in the last 72 hours ABG No results found for this basename: PHART:2,PCO2:2,PO2:2,HCO3:2 in the last 72 hours  Studies/Results: Dg Cholangiogram Operative  11/11/2011  *RADIOLOGY REPORT*  Clinical Data:   Cholelithiasis.  INTRAOPERATIVE CHOLANGIOGRAM  Technique:  Cholangiographic images from the C-arm fluoroscopic device were submitted for interpretation post-operatively.  Please see the procedural report for the amount of contrast and the fluoroscopy time utilized.  Comparison:  Ultrasound 11/10/2011  Findings:  There are linear and nonobstructive filling defects involving the biliary confluence and common hepatic duct.  Filling defects extend into the  common bile duct.  Findings suggest nonobstructing sludge.  Contrast drains into the duodenum.  No significant biliary dilatation.  IMPRESSION: Nonobstructive filling defects within the biliary system most likely represent sludge.  Original Report Authenticated By: Richarda Overlie, M.D.   US Abdomen Complete  11/10/2011  *RADIOLOGY REPORT*  Clinical Data:  Right upper quadrant pain.  COMPLETE ABDOMINAL ULTRASOUND  Comparison:  None  Findings:  Gallbladder:  3.8 cm gallstone is noted in the gallbladder neck region.  Sludge seen within the gallbladder.  The patient was tender over the gallbladder during the examination.  Gallbladder wall thickened at 4 mm.  Findings concerning for acute cholecystitis.  Common bile duct:   Upper normal at 6 mm.  Liver:  No focal lesion identified.  Within normal limits in parenchymal echogenicity.  IVC:  Appears normal.  Pancreas:  No focal abnormality seen.  Spleen:  Within normal limits in size and echotexture.  Right Kidney:   Normal in size and parenchymal echogenicity.  No evidence of mass or hydronephrosis.  Left Kidney:  Normal in size and parenchymal echogenicity.  No evidence of mass or hydronephrosis.  Abdominal aorta:  No aneurysm identified.  IMPRESSION: 3.8 cm gallstone lodged within the gallbladder neck.  Gallbladder is filled with sludge.  Mild wall thickening and sonographic Murphy's sign suggest acute cholecystitis.  Original Report Authenticated By: Cyndie Chime, M.D.    Assessment: Principal  Problem:  *Acute cholecystitis     Procedure(s): LAPAROSCOPIC CHOLECYSTECTOMY WITH INTRAOPERATIVE CHOLANGIOGRAM  Plan: WBC down, LFTs up including bili to 3.2 Sludge noted on IOC reading. Will keep on abx and repeat enzymes in am. If remain elevated, will need GI to eval, if LFTs go back down, can DC home.  LOS: 2 days    Marianna Fuss 11/12/2011

## 2011-11-12 NOTE — Progress Notes (Signed)
Patient seen and examined.  Overall, she feels better today.  No scleral icterus.  Will repeat LFTs now.

## 2011-11-12 NOTE — Discharge Summary (Signed)
  Physician Discharge Summary  Patient ID: Terri Richardson MRN: [id number] DOB/AGE: 12/12/1970 41 y.o.  Admit date: 11/10/2011 Discharge date: 11/12/2011  Admission Diagnoses: Cholecystitis Discharge Diagnoses:  Principal Problem:  *Acute cholecystitis  Procedure(s): LAPAROSCOPIC CHOLECYSTECTOMY WITH INTRAOPERATIVE CHOLANGIOGRAM  Discharged Condition: good  Hospital Course: Pt admitted after eval in ER finds cholecystitis and pt with significant symptoms. She was taken to OR on 11/11/11 by Dr. Lindie Spruce and underwent lap chole with IOC. She had an initial bump in her LFTs post-op but repeat labs were normal. She tolerated regular diet, voided well, and pain control was good. She is stable for DC home today.  Consults: none   Discharge Exam: Blood pressure 132/84, pulse 57, temperature 98.6 F (37 C), temperature source Oral, resp. rate 18, height 5\' 2"  (1.575 m), weight 75.206 kg (165 lb 12.8 oz), SpO2 95.00%. Lungs: CTA without w/r/r Heart: Regular Abdomen: soft, ND, appropriately tender   Incisions all c/d/i without erythema or hematoma. Ext: No edema or tenderness   Disposition:   Discharge Orders    Future Orders Please Complete By Expires   Diet - low sodium heart healthy      Increase activity slowly      May shower / Bathe      Remove dressing in 24 hours      Comments:   Remove clear bandage, leave white strips in place.   Call MD for:  redness, tenderness, or signs of infection (pain, swelling, redness, odor or green/yellow discharge around incision site)      Call MD for:  severe uncontrolled pain      Call MD for:  persistant nausea and vomiting      Call MD for:  temperature >100.4        Current Discharge Medication List    START taking these medications   Details  oxyCODONE-acetaminophen (PERCOCET) 5-325 MG per tablet Take 1-2 tablets by mouth every 4 (four) hours as needed. Qty: 30 tablet, Refills: 0       Follow-up Information    Follow up with CCS,MD,  MD on 11/28/2011. (D[facility] clinic 1:40pm)    Contact information:   [facility] [address] [place] [address] [phone]          Signed: Marianna Fuss 11/12/2011, 12:38 PM

## 2011-11-14 ENCOUNTER — Encounter (HOSPITAL_COMMUNITY): Payer: Self-pay | Admitting: General Surgery

## 2011-11-15 NOTE — Discharge Summary (Signed)
Agree with above 

## 2015-04-21 ENCOUNTER — Ambulatory Visit
Admission: RE | Admit: 2015-04-21 | Discharge: 2015-04-21 | Disposition: A | Discharge status: Home / Self Care | Payer: Managed Care, Other (non HMO) | Source: Ambulatory Visit | Attending: Physician Assistant | Admitting: Physician Assistant

## 2015-04-21 ENCOUNTER — Other Ambulatory Visit: Payer: Self-pay | Admitting: Physician Assistant

## 2015-04-21 DIAGNOSIS — R059 Cough, unspecified: Secondary | ICD-10-CM

## 2015-04-21 DIAGNOSIS — R05 Cough: Secondary | ICD-10-CM

## 2015-04-21 IMAGING — CR DG CHEST 2V
2 series · 2 of 2 positions shown · non-contrast
Comparison: None.

CLINICAL DATA: Cough for 1 month.  Prior smoker.

EXAM:
CHEST  2 VIEW

[w chest pa]
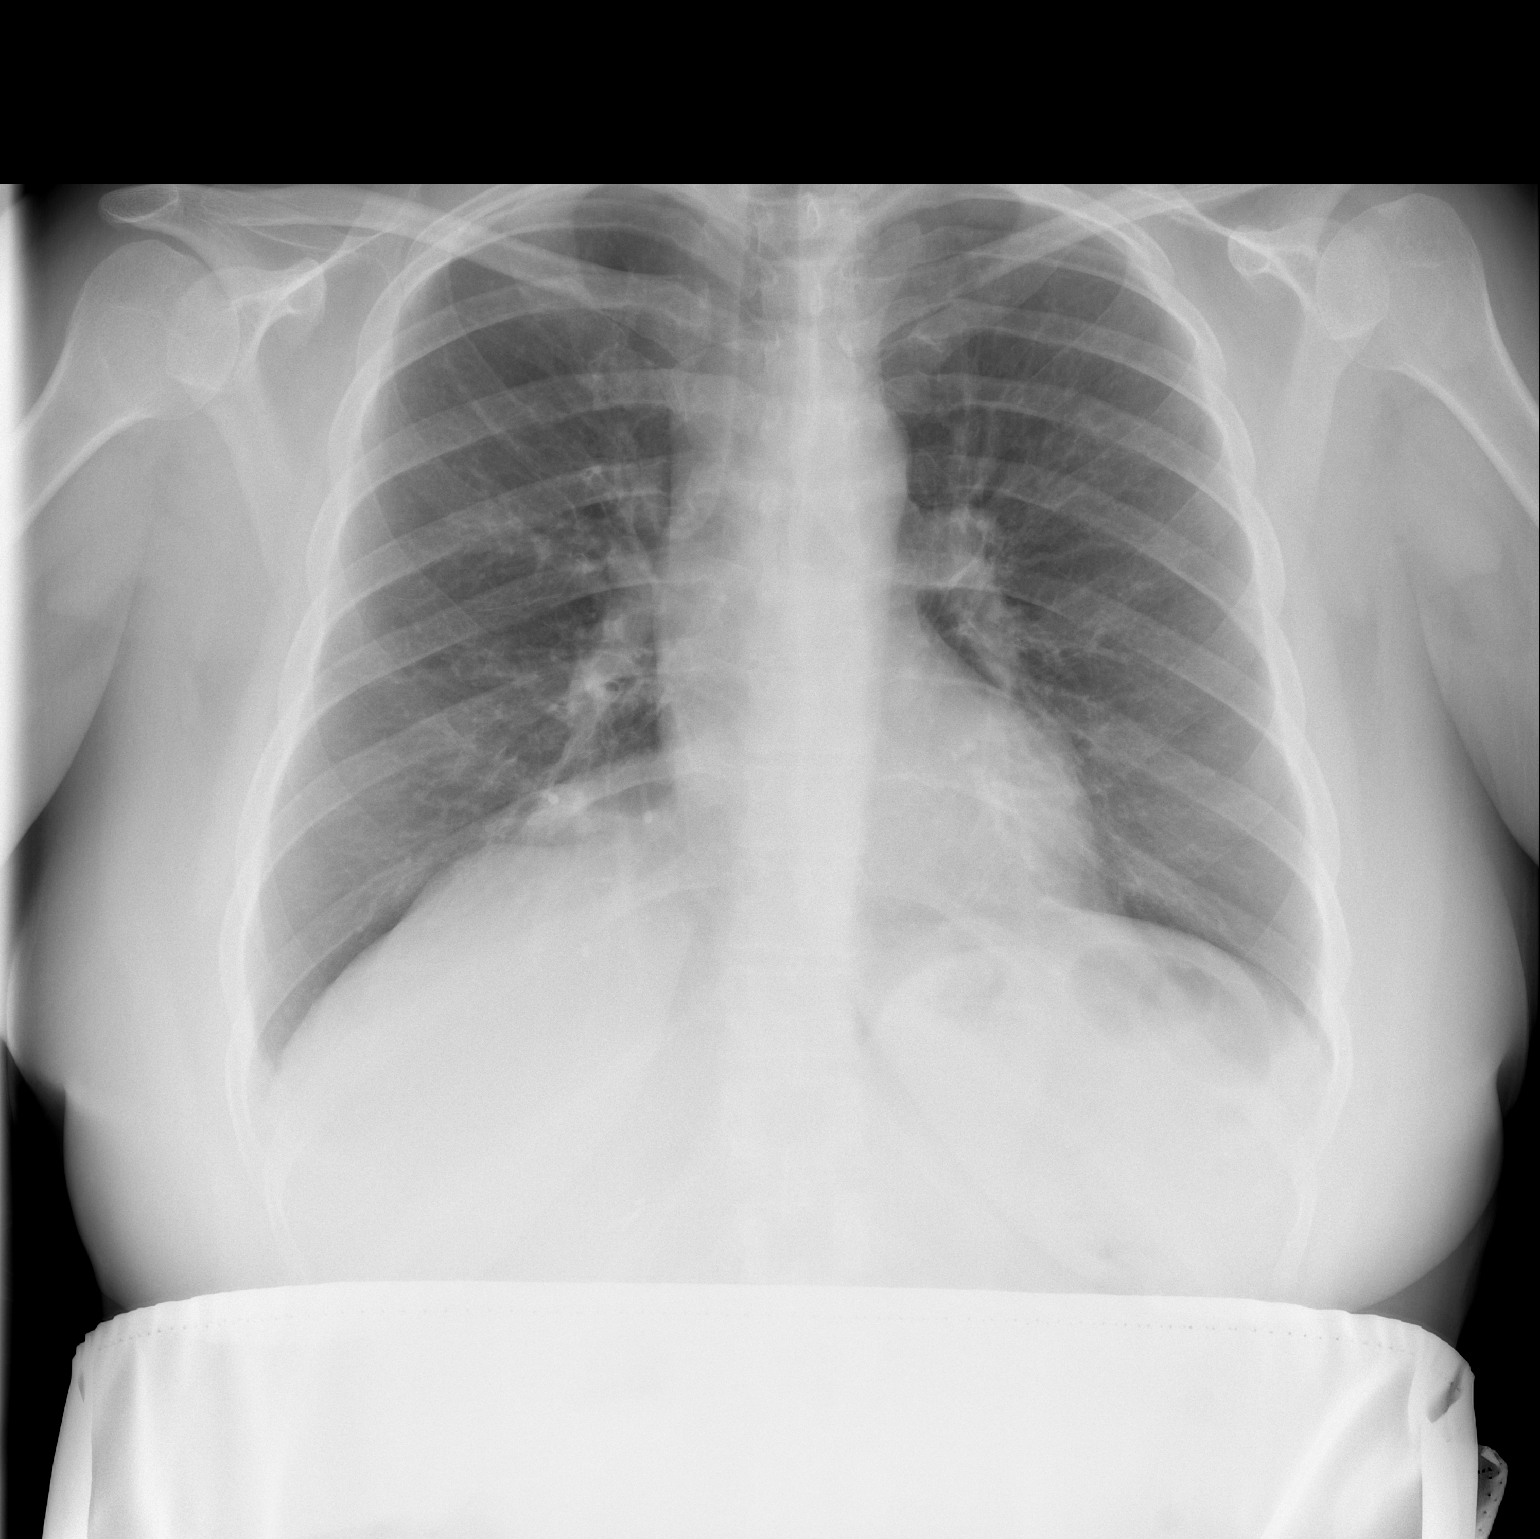

[w chest lat]
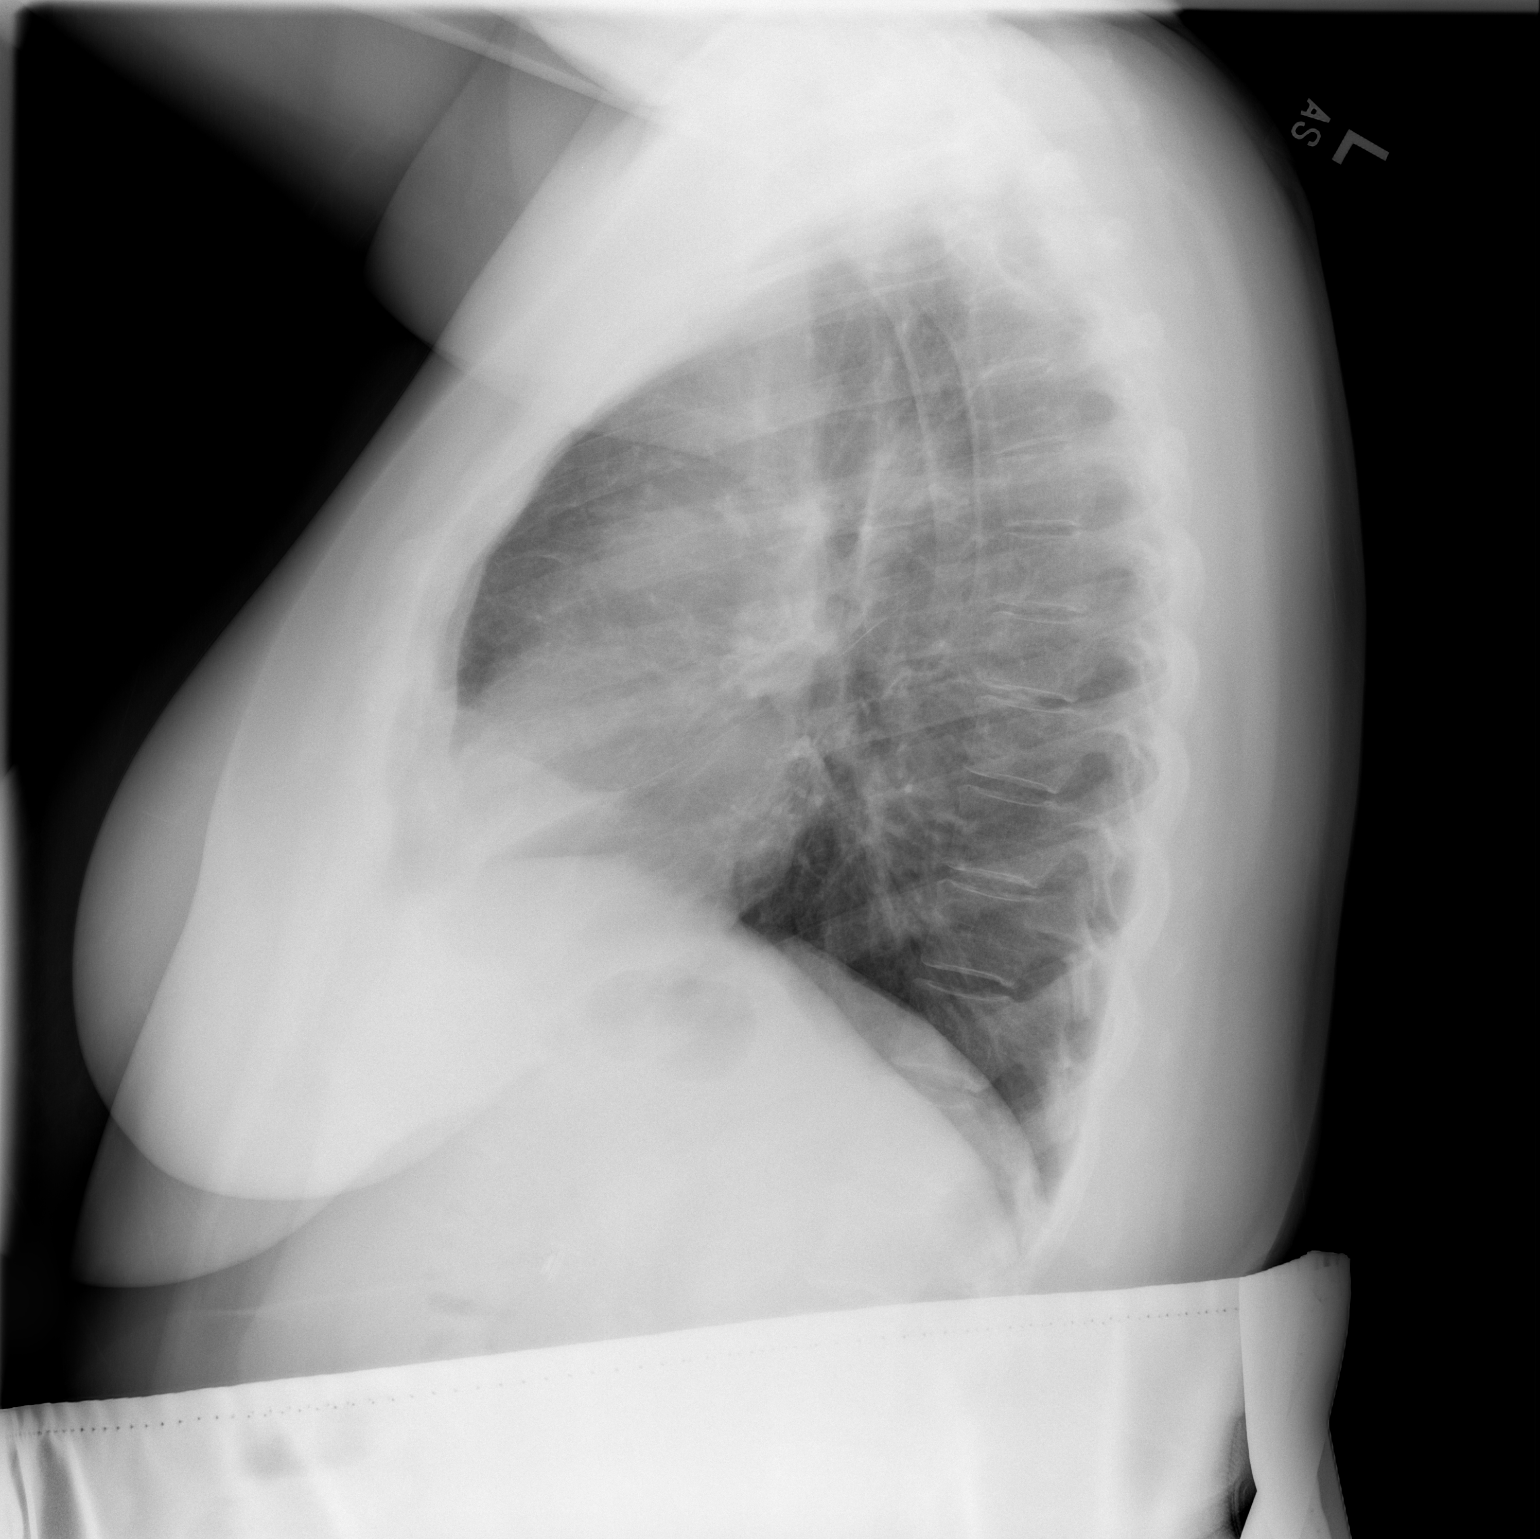

[2 of 2 positions shown; findings below may reference images not displayed]

FINDINGS: Cardiac silhouette normal in size and configuration. Normal
mediastinal and hilar contours.

Clear lungs.  No pleural effusion or pneumothorax.

Bony thorax is unremarkable.
IMPRESSION: No active cardiopulmonary disease.

## 2016-01-03 ENCOUNTER — Ambulatory Visit (INDEPENDENT_AMBULATORY_CARE_PROVIDER_SITE_OTHER): Payer: Managed Care, Other (non HMO) | Admitting: Family Medicine

## 2016-01-03 ENCOUNTER — Encounter: Payer: Self-pay | Admitting: Family Medicine

## 2016-01-03 ENCOUNTER — Telehealth: Payer: Self-pay | Admitting: Family Medicine

## 2016-01-03 VITALS — BP 160/100 | Temp 98.0°F | Ht 62.0 in | Wt 198.2 lb

## 2016-01-03 DIAGNOSIS — J4521 Mild intermittent asthma with (acute) exacerbation: Secondary | ICD-10-CM | POA: Diagnosis not present

## 2016-01-03 DIAGNOSIS — R062 Wheezing: Secondary | ICD-10-CM

## 2016-01-03 DIAGNOSIS — R030 Elevated blood-pressure reading, without diagnosis of hypertension: Secondary | ICD-10-CM | POA: Diagnosis not present

## 2016-01-03 DIAGNOSIS — E876 Hypokalemia: Secondary | ICD-10-CM

## 2016-01-03 DIAGNOSIS — E1169 Type 2 diabetes mellitus with other specified complication: Secondary | ICD-10-CM

## 2016-01-03 DIAGNOSIS — IMO0001 Reserved for inherently not codable concepts without codable children: Secondary | ICD-10-CM

## 2016-01-03 DIAGNOSIS — E119 Type 2 diabetes mellitus without complications: Secondary | ICD-10-CM | POA: Insufficient documentation

## 2016-01-03 LAB — HEPATIC FUNCTION PANEL
ALT: 23 U/L (ref 0–35)
AST: 19 U/L (ref 0–37)
Albumin: 4.5 g/dL (ref 3.5–5.2)
Alkaline Phosphatase: 48 U/L (ref 39–117)
BILIRUBIN DIRECT: 0.1 mg/dL (ref 0.0–0.3)
BILIRUBIN TOTAL: 0.4 mg/dL (ref 0.2–1.2)
Total Protein: 7.7 g/dL (ref 6.0–8.3)

## 2016-01-03 LAB — CBC WITH DIFFERENTIAL/PLATELET
BASOS PCT: 0.6 % (ref 0.0–3.0)
Basophils Absolute: 0.1 10*3/uL (ref 0.0–0.1)
EOS ABS: 1.6 10*3/uL — AB (ref 0.0–0.7)
Eosinophils Relative: 15.3 % — ABNORMAL HIGH (ref 0.0–5.0)
HCT: 43.4 % (ref 36.0–46.0)
HEMOGLOBIN: 14.8 g/dL (ref 12.0–15.0)
LYMPHS ABS: 2.7 10*3/uL (ref 0.7–4.0)
Lymphocytes Relative: 25.9 % (ref 12.0–46.0)
MCHC: 34 g/dL (ref 30.0–36.0)
MCV: 90.8 fl (ref 78.0–100.0)
MONO ABS: 0.9 10*3/uL (ref 0.1–1.0)
Monocytes Relative: 8.7 % (ref 3.0–12.0)
NEUTROS PCT: 49.5 % (ref 43.0–77.0)
Neutro Abs: 5.1 10*3/uL (ref 1.4–7.7)
Platelets: 305 10*3/uL (ref 150.0–400.0)
RBC: 4.78 Mil/uL (ref 3.87–5.11)
RDW: 13.9 % (ref 11.5–15.5)
WBC: 10.4 10*3/uL (ref 4.0–10.5)

## 2016-01-03 LAB — BASIC METABOLIC PANEL
BUN: 12 mg/dL (ref 6–23)
CALCIUM: 9.3 mg/dL (ref 8.4–10.5)
CO2: 26 meq/L (ref 19–32)
CREATININE: 0.82 mg/dL (ref 0.40–1.20)
Chloride: 102 mEq/L (ref 96–112)
GFR: 80.87 mL/min (ref >60.00)
Glucose, Bld: 106 mg/dL — ABNORMAL HIGH (ref 70–99)
Potassium: 3.2 mEq/L — ABNORMAL LOW (ref 3.5–5.1)
SODIUM: 140 meq/L (ref 135–145)

## 2016-01-03 LAB — CHOLESTEROL, TOTAL: CHOLESTEROL: 187 mg/dL (ref 0–200)

## 2016-01-03 LAB — HEMOGLOBIN A1C: HEMOGLOBIN A1C: 6.4 % (ref 4.6–6.5)

## 2016-01-03 MED ORDER — ALBUTEROL SULFATE HFA 108 (90 BASE) MCG/ACT IN AERS: 2.0000 | INHALATION_SPRAY | Freq: Four times a day (QID) | RESPIRATORY_TRACT | Status: DC | PRN

## 2016-01-03 MED ORDER — PREDNISONE 10 MG PO TABS: ORAL_TABLET | ORAL | Status: DC

## 2016-01-03 NOTE — Progress Notes (Signed)
Subjective:    Patient ID: Terri Richardson, female    DOB: 1971-08-18, 45 y.o.   MRN: [id number]  HPI Terri Richardson is a 45 year old female who presents today to establish care.   Type 2 Diabetes diagnosed in 2006: Patient does not take any medications and does not check her blood sugar. Previously, took metformin for 2 years and followed by an endocrinologist. Patient states that endocrinologist stated that she no longer needed medication due to A1C levels. Denies polyuria, polydipsia, and polyphagia.    Elevated Blood Pressure:   Denies chest pain, arm pain, SOB, nosebleeds, and HA. She does not monitor her BP at home and states that she does not remember having an issue in the past with her BP.   She is followed by gynecology for pap and breast exams. Patient is due for follow up in May/June 2017.  Patient reports Tdap within the last 10 years as part of a physical at [facility] on [address] and will obtain date for her records.  She did not receive a flu vaccine this season and declines one today.  Today, patient reports congestion, rhinitis with clear nasal drainage, productive cough with yellow sputum,and PND that has been present for at least one month. Denies fever, chills, and sweats, sinus pressure/pain, ear pain/pressure, loss of sense of smell and taste. Denies history of asthma and bronchitis, no recent antibiotic use, and  generalized body aches.  Recent flu exposure with child. Treatment at home today include DayQuil with limited benefit. She states that she has noticed wheezing and has episodes of SOB with exercise. This is a new finding and concerning for her as she states this is impacting her daily activities. She denies any history of pulmonary function testing.  Diet:  Modified heart healthy diet. Limited fried foods and patient states she drinks water, tea, and only 3-4 diet sodas/year.  Exercise:  Denies exercise and states this is due to wheezing.   Review of Systems   HENT: Positive for congestion, postnasal drip and rhinorrhea. Negative for ear pain, sinus pressure, sneezing and sore throat.   Respiratory: Positive for cough and shortness of breath. Negative for chest tightness.        SOB with exercise and notes periods of wheezing  Cardiovascular: Negative for chest pain, palpitations and leg swelling.  Gastrointestinal: Negative for nausea, vomiting, abdominal pain, diarrhea and constipation.  Endocrine: Negative for cold intolerance, heat intolerance, polydipsia, polyphagia and polyuria.  Genitourinary: Negative for dysuria, urgency, frequency, flank pain and menstrual problem.  Musculoskeletal: Negative for myalgias and arthralgias.  Skin: Negative for rash.  Neurological: Negative for dizziness, light-headedness and headaches.  Psychiatric/Behavioral:       Denies depressed or anxious mood   Past Medical History  Diagnosis Date  . Diabetes mellitus 2006    Social History   Social History  . Marital Status: Married    Spouse Name: N/A  . Number of Children: N/A  . Years of Education: N/A   Occupational History  . Works at [employer]    Social History Main Topics  . Smoking status: Former Smoker -- 2 years    Types: Cigarettes    Quit date: 11/06/2013  . Smokeless tobacco: Never Used  . Alcohol Use: No  . Drug Use: No  . Sexual Activity: Yes    Birth Control/ Protection: None   Other Topics Concern  . Not on file   Social History Narrative    Past Surgical History  Procedure Laterality Date  . Cesarean section  2008  . Tonsillectomy  6th grade  . Appendectomy  1st grade  . Cholecystectomy  11/11/2011    Procedure: LAPAROSCOPIC CHOLECYSTECTOMY WITH INTRAOPERATIVE CHOLANGIOGRAM;  Surgeon: Jetty DuhamelJames O Wyatt III, MD;  Location: [facility] OR;  Service: General;  Laterality: N/A;    Family History  Problem Relation Age of Onset  . Hyperlipidemia Mother   . Hypertension Father   . Tongue cancer Maternal Grandfather   . Prostate  cancer Paternal Uncle   . Pancreatic cancer Maternal Uncle   . Heart disease Brother     No Known Allergies  No current outpatient prescriptions on file prior to visit.   No current facility-administered medications on file prior to visit.    BP 160/100 mmHg  Temp(Src) 98 F (36.7 C) (Oral)  Ht 5\' 2"  (1.575 m)  Wt 198 lb 3.2 oz (89.903 kg)  BMI 36.24 kg/m2        Objective:   Physical Exam  Constitutional: She is oriented to person, place, and time. She appears well-developed and well-nourished.  HENT:  Right Ear: Tympanic membrane normal.  Left Ear: Tympanic membrane normal.  Nose: Rhinorrhea present. Right sinus exhibits no maxillary sinus tenderness and no frontal sinus tenderness. Left sinus exhibits no maxillary sinus tenderness and no frontal sinus tenderness.  Mouth/Throat: Mucous membranes are normal. No oropharyngeal exudate or posterior oropharyngeal erythema.  PND noted  Eyes: Pupils are equal, round, and reactive to light.  Cardiovascular: Normal rate and regular rhythm.   No murmur heard. Pulmonary/Chest: Effort normal. She has wheezes.  Lymphadenopathy:    She has no cervical adenopathy.  Neurological: She is alert and oriented to person, place, and time.  Skin: Skin is warm and dry. No rash noted.  Psychiatric: She has a normal mood and affect. Her behavior is normal.       Assessment & Plan:   1. Type 2 diabetes mellitus with other specified complication (HCC) - Hemoglobin A1c   2. Elevated blood pressure Advised patient to monitor her BP at home, document readings, and follow up in 2 weeks. Discussed with her the importance of reporting BPs >140/90 and if she develops any chest pain, arm pain, headaches, nosebleeds, and SOB she should seek medical attention for these symptoms.  - CBC with Differential/Platelet - Cholesterol, total - Basic metabolic panel - Hepatic function panel  3. Wheezing Symptom of wheezing treated today. Referral for  pulmonary function testing due to patient report of wheezing episodes that are occurring with exercise.  Supportive treatment for nasal congestion and avoidance of allergen triggers discussed. - Ambulatory referral to Pulmonology - predniSONE (DELTASONE) 10 MG tablet; Take 4 tablets by mouth once daily for 2 days, 3 tablets once daily for 2 days, 2 tablets once daily for 2 days, and 1 tab daily for 2 days.  Dispense: 20 tablet; Refill: 0 - albuterol (PROVENTIL HFA;VENTOLIN HFA) 108 (90 Base) MCG/ACT inhaler; Inhale 2 puffs into the lungs every 6 (six) hours as needed for wheezing or shortness of breath.  Dispense: 1 Inhaler; Refill: 0  4. Mild intermittent reactive airway disease with wheezing with acute exacerbation  Referral to pulmonology placed, suspect reactive airway that may be induced with exercise or have an allergy trigger. Patient does not remember if she had wheezing in the past but has noticed this over the past month and it is a concern due to exacerbation of symptoms with exercise.   Advised patient to follow up in 2  weeks for evaluation of BP. She also needs to schedule a physical at her convenience.

## 2016-01-03 NOTE — Telephone Encounter (Signed)
Recheck potassium this week. Results will be called within one week or sooner if needed. See result notes for further information regarding lab values.

## 2016-01-03 NOTE — Patient Instructions (Signed)
Please go to the lab before you leave.  Results will be called to you within one week or sooner if needed.  Please monitor BP at home and consider purchasing Omron BP digital cuff.  Monitor BP for 2 weeks and bring cuff with you at your next visit. Also, prednisone has been ordered for you today with an albuterol inhaler. A referral has been made to pulmonology for lung function tests.  Follow up in 2 weeks for BP evaluation and wheezing.  DASH Eating Plan DASH stands for "Dietary Approaches to Stop Hypertension." The DASH eating plan is a healthy eating plan that has been shown to reduce high blood pressure (hypertension). Additional health benefits may include reducing the risk of type 2 diabetes mellitus, heart disease, and stroke. The DASH eating plan may also help with weight loss. WHAT DO I NEED TO KNOW ABOUT THE DASH EATING PLAN? For the DASH eating plan, you will follow these general guidelines:  Choose foods with a percent daily value for sodium of less than 5% (as listed on the food label).  Use salt-free seasonings or herbs instead of table salt or sea salt.  Check with your health care provider or pharmacist before using salt substitutes.  Eat lower-sodium products, often labeled as "lower sodium" or "no salt added."  Eat fresh foods.  Eat more vegetables, fruits, and low-fat dairy products.  Choose whole grains. Look for the word "whole" as the first word in the ingredient list.  Choose fish and skinless chicken or [place] more often than red meat. Limit fish, poultry, and meat to 6 oz (170 g) each day.  Limit sweets, desserts, sugars, and sugary drinks.  Choose heart-healthy fats.  Limit cheese to 1 oz (28 g) per day.  Eat more home-cooked food and less restaurant, buffet, and fast food.  Limit fried foods.  Cook foods using methods other than frying.  Limit canned vegetables. If you do use them, rinse them well to decrease the sodium.  When eating at a restaurant,  ask that your food be prepared with less salt, or no salt if possible. WHAT FOODS CAN I EAT? Seek help from a dietitian for individual calorie needs. Grains Whole grain or whole wheat bread. Brown rice. Whole grain or whole wheat pasta. Quinoa, bulgur, and whole grain cereals. Low-sodium cereals. Corn or whole wheat flour tortillas. Whole grain cornbread. Whole grain crackers. Low-sodium crackers. Vegetables Fresh or frozen vegetables (raw, steamed, roasted, or grilled). Low-sodium or reduced-sodium tomato and vegetable juices. Low-sodium or reduced-sodium tomato sauce and paste. Low-sodium or reduced-sodium canned vegetables.  Fruits All fresh, canned (in natural juice), or frozen fruits. Meat and Other Protein Products Ground beef (85% or leaner), grass-fed beef, or beef trimmed of fat. Skinless chicken or [place]. Ground chicken or [place]. Pork trimmed of fat. All fish and seafood. Eggs. Dried beans, peas, or lentils. Unsalted nuts and seeds. Unsalted canned beans. Dairy Low-fat dairy products, such as skim or 1% milk, 2% or reduced-fat cheeses, low-fat ricotta or cottage cheese, or plain low-fat yogurt. Low-sodium or reduced-sodium cheeses. Fats and Oils Tub margarines without trans fats. Light or reduced-fat mayonnaise and salad dressings (reduced sodium). Avocado. Safflower, olive, or canola oils. Natural peanut or almond butter. Other Unsalted popcorn and pretzels. The items listed above may not be a complete list of recommended foods or beverages. Contact your dietitian for more options. WHAT FOODS ARE NOT RECOMMENDED? Grains White bread. White pasta. White rice. Refined cornbread. Bagels and croissants. Crackers that contain trans  fat. Vegetables Creamed or fried vegetables. Vegetables in a cheese sauce. Regular canned vegetables. Regular canned tomato sauce and paste. Regular tomato and vegetable juices. Fruits Dried fruits. Canned fruit in light or heavy syrup. Fruit juice. Meat  and Other Protein Products Fatty cuts of meat. Ribs, chicken wings, bacon, sausage, bologna, salami, chitterlings, fatback, hot dogs, bratwurst, and packaged luncheon meats. Salted nuts and seeds. Canned beans with salt. Dairy Whole or 2% milk, cream, half-and-half, and cream cheese. Whole-fat or sweetened yogurt. Full-fat cheeses or blue cheese. Nondairy creamers and whipped toppings. Processed cheese, cheese spreads, or cheese curds. Condiments Onion and garlic salt, seasoned salt, table salt, and sea salt. Canned and packaged gravies. Worcestershire sauce. Tartar sauce. Barbecue sauce. Teriyaki sauce. Soy sauce, including reduced sodium. Steak sauce. Fish sauce. Oyster sauce. Cocktail sauce. Horseradish. Ketchup and mustard. Meat flavorings and tenderizers. Bouillon cubes. Hot sauce. Tabasco sauce. Marinades. Taco seasonings. Relishes. Fats and Oils Butter, stick margarine, lard, shortening, ghee, and bacon fat. Coconut, palm kernel, or palm oils. Regular salad dressings. Other Pickles and olives. Salted popcorn and pretzels. The items listed above may not be a complete list of foods and beverages to avoid. Contact your dietitian for more information. WHERE CAN I FIND MORE INFORMATION? National Heart, Lung, and Blood Institute: CablePromo.itwww.nhlbi.nih.gov/health/health-topics/topics/dash/   This information is not intended to replace advice given to you by your health care provider. Make sure you discuss any questions you have with your health care provider.   Document Released: 09/28/2011 Document Revised: 10/30/2014 Document Reviewed: 08/13/2013 Elsevier Interactive Patient Education [employer]2016 Elsevier Inc.

## 2016-01-04 NOTE — Telephone Encounter (Signed)
Patient was advised regarding her potassium. Requested that she return for a recheck of of her potassium only as this may be a lab error or can be a true number. She responded that she felt flu like symptoms today and that she will return when feeling better. Advise patient that potassium can be lowered by vomiting or diarrhea and that low levels can cause muscle weakness and heart rhythm irregularities. If she is experiencing these symptoms, she should seek care immediately.

## 2016-01-24 ENCOUNTER — Ambulatory Visit: Payer: Managed Care, Other (non HMO) | Admitting: Family Medicine

## 2016-01-25 ENCOUNTER — Encounter: Payer: Self-pay | Admitting: Internal Medicine

## 2016-01-25 ENCOUNTER — Ambulatory Visit (INDEPENDENT_AMBULATORY_CARE_PROVIDER_SITE_OTHER)
Admission: RE | Admit: 2016-01-25 | Discharge: 2016-01-25 | Disposition: A | Discharge status: Home / Self Care | Payer: Managed Care, Other (non HMO) | Source: Ambulatory Visit | Attending: Internal Medicine | Admitting: Internal Medicine

## 2016-01-25 ENCOUNTER — Other Ambulatory Visit (INDEPENDENT_AMBULATORY_CARE_PROVIDER_SITE_OTHER): Payer: Managed Care, Other (non HMO)

## 2016-01-25 ENCOUNTER — Ambulatory Visit (INDEPENDENT_AMBULATORY_CARE_PROVIDER_SITE_OTHER): Payer: Managed Care, Other (non HMO) | Admitting: Internal Medicine

## 2016-01-25 VITALS — BP 200/140 | HR 98 | Ht 62.0 in | Wt 202.8 lb

## 2016-01-25 DIAGNOSIS — J449 Chronic obstructive pulmonary disease, unspecified: Secondary | ICD-10-CM

## 2016-01-25 DIAGNOSIS — R0600 Dyspnea, unspecified: Secondary | ICD-10-CM

## 2016-01-25 DIAGNOSIS — J45909 Unspecified asthma, uncomplicated: Secondary | ICD-10-CM | POA: Diagnosis not present

## 2016-01-25 DIAGNOSIS — I10 Essential (primary) hypertension: Secondary | ICD-10-CM | POA: Diagnosis not present

## 2016-01-25 LAB — BRAIN NATRIURETIC PEPTIDE: PRO B NATRI PEPTIDE: 36 pg/mL (ref 0.0–100.0)

## 2016-01-25 LAB — CBC WITH DIFFERENTIAL/PLATELET
BASOS ABS: 0.3 10*3/uL — AB (ref 0.0–0.1)
BASOS PCT: 2.9 % (ref 0.0–3.0)
EOS ABS: 1.2 10*3/uL — AB (ref 0.0–0.7)
EOS PCT: 10.4 % — AB (ref 0.0–5.0)
HCT: 41.4 % (ref 36.0–46.0)
Hemoglobin: 13.9 g/dL (ref 12.0–15.0)
LYMPHS ABS: 3.2 10*3/uL (ref 0.7–4.0)
Lymphocytes Relative: 28.6 % (ref 12.0–46.0)
MCHC: 33.7 g/dL (ref 30.0–36.0)
MCV: 89.8 fl (ref 78.0–100.0)
MONOS PCT: 10 % (ref 3.0–12.0)
Monocytes Absolute: 1.1 10*3/uL — ABNORMAL HIGH (ref 0.1–1.0)
NEUTROS ABS: 5.4 10*3/uL (ref 1.4–7.7)
NEUTROS PCT: 48.1 % (ref 43.0–77.0)
Platelets: 272 10*3/uL (ref 150.0–400.0)
RBC: 4.61 Mil/uL (ref 3.87–5.11)
RDW: 13.7 % (ref 11.5–15.5)
WBC: 11.2 10*3/uL — ABNORMAL HIGH (ref 4.0–10.5)

## 2016-01-25 LAB — NITRIC OXIDE: NITRIC OXIDE: 106

## 2016-01-25 IMAGING — DX DG CHEST 2V
2 series · 2 of 2 positions shown · non-contrast
Comparison: None.

CLINICAL DATA: Worsening cough, chest congestion, and intermittent
shortness of breath for 4 months. Diabetes and hypertension.
Previous smoker.

EXAM:
CHEST  2 VIEW

[chest pa]
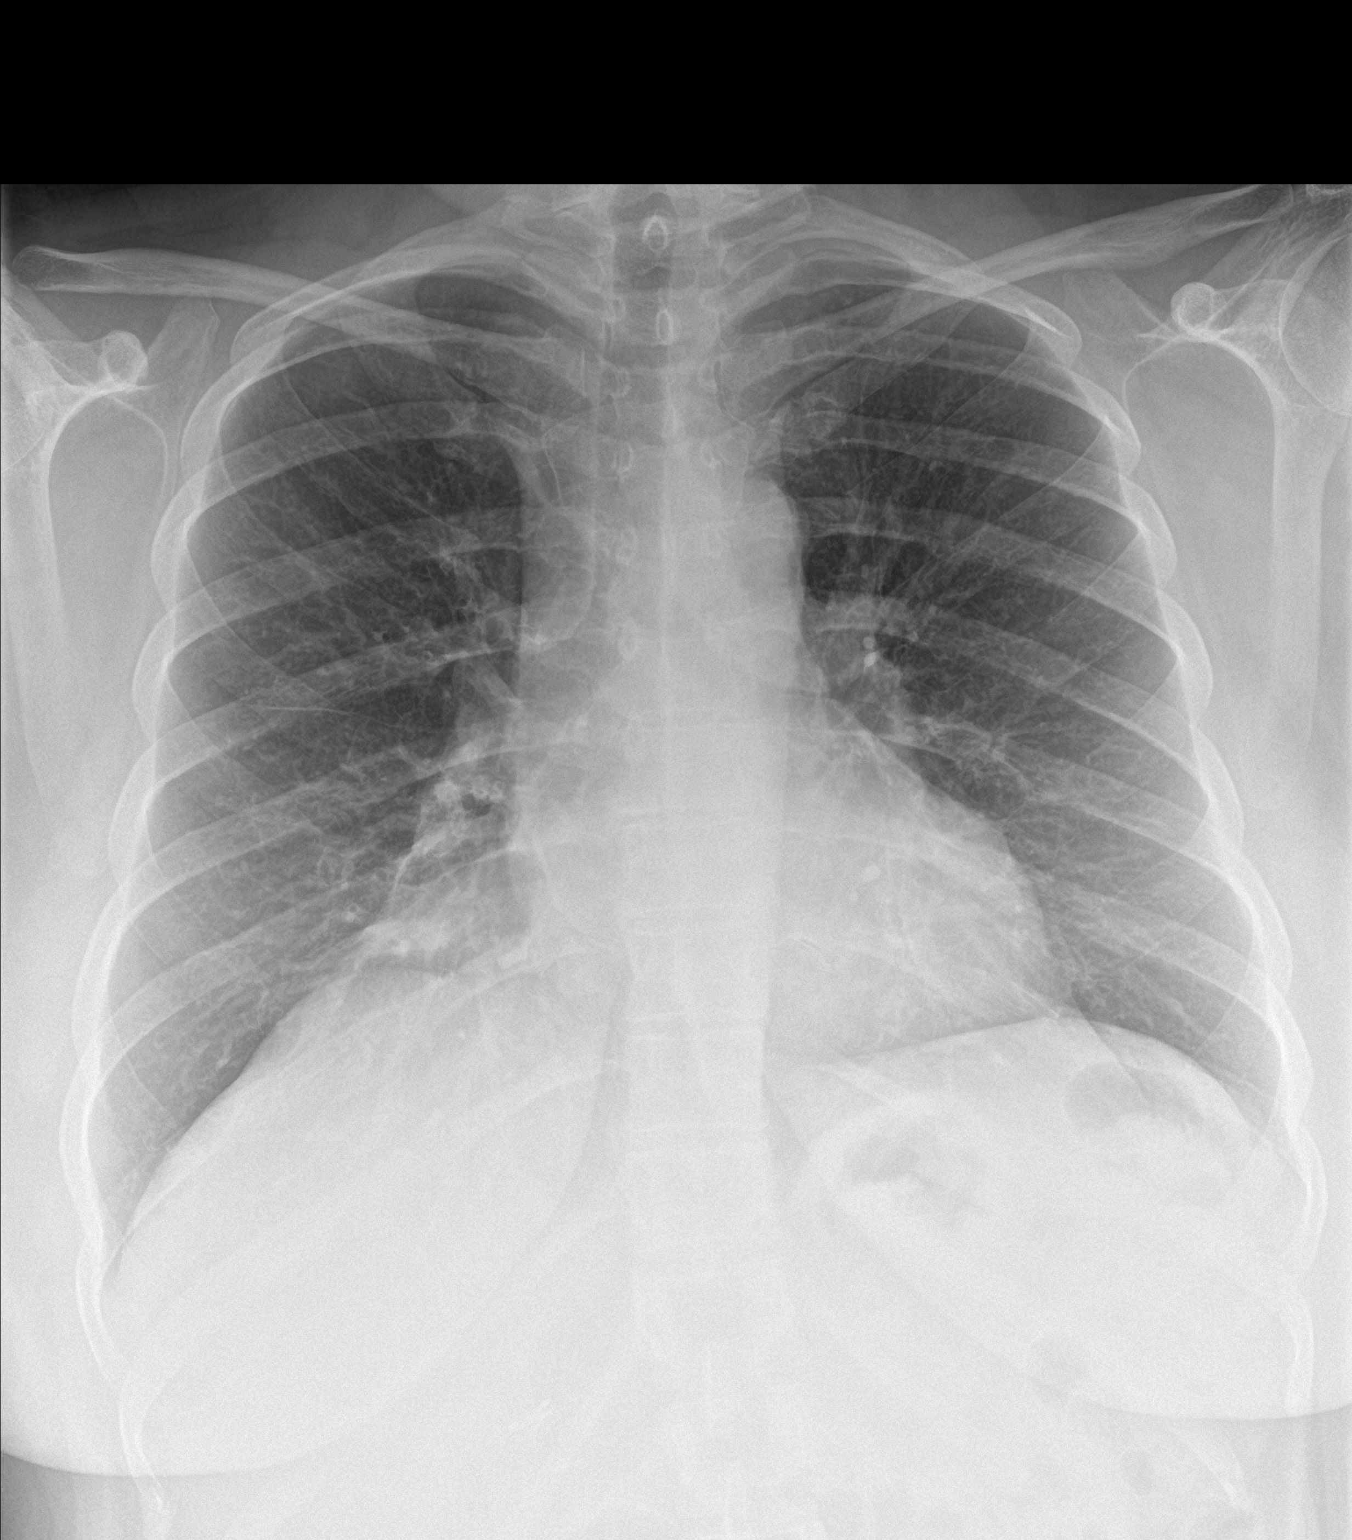

[chest lat]
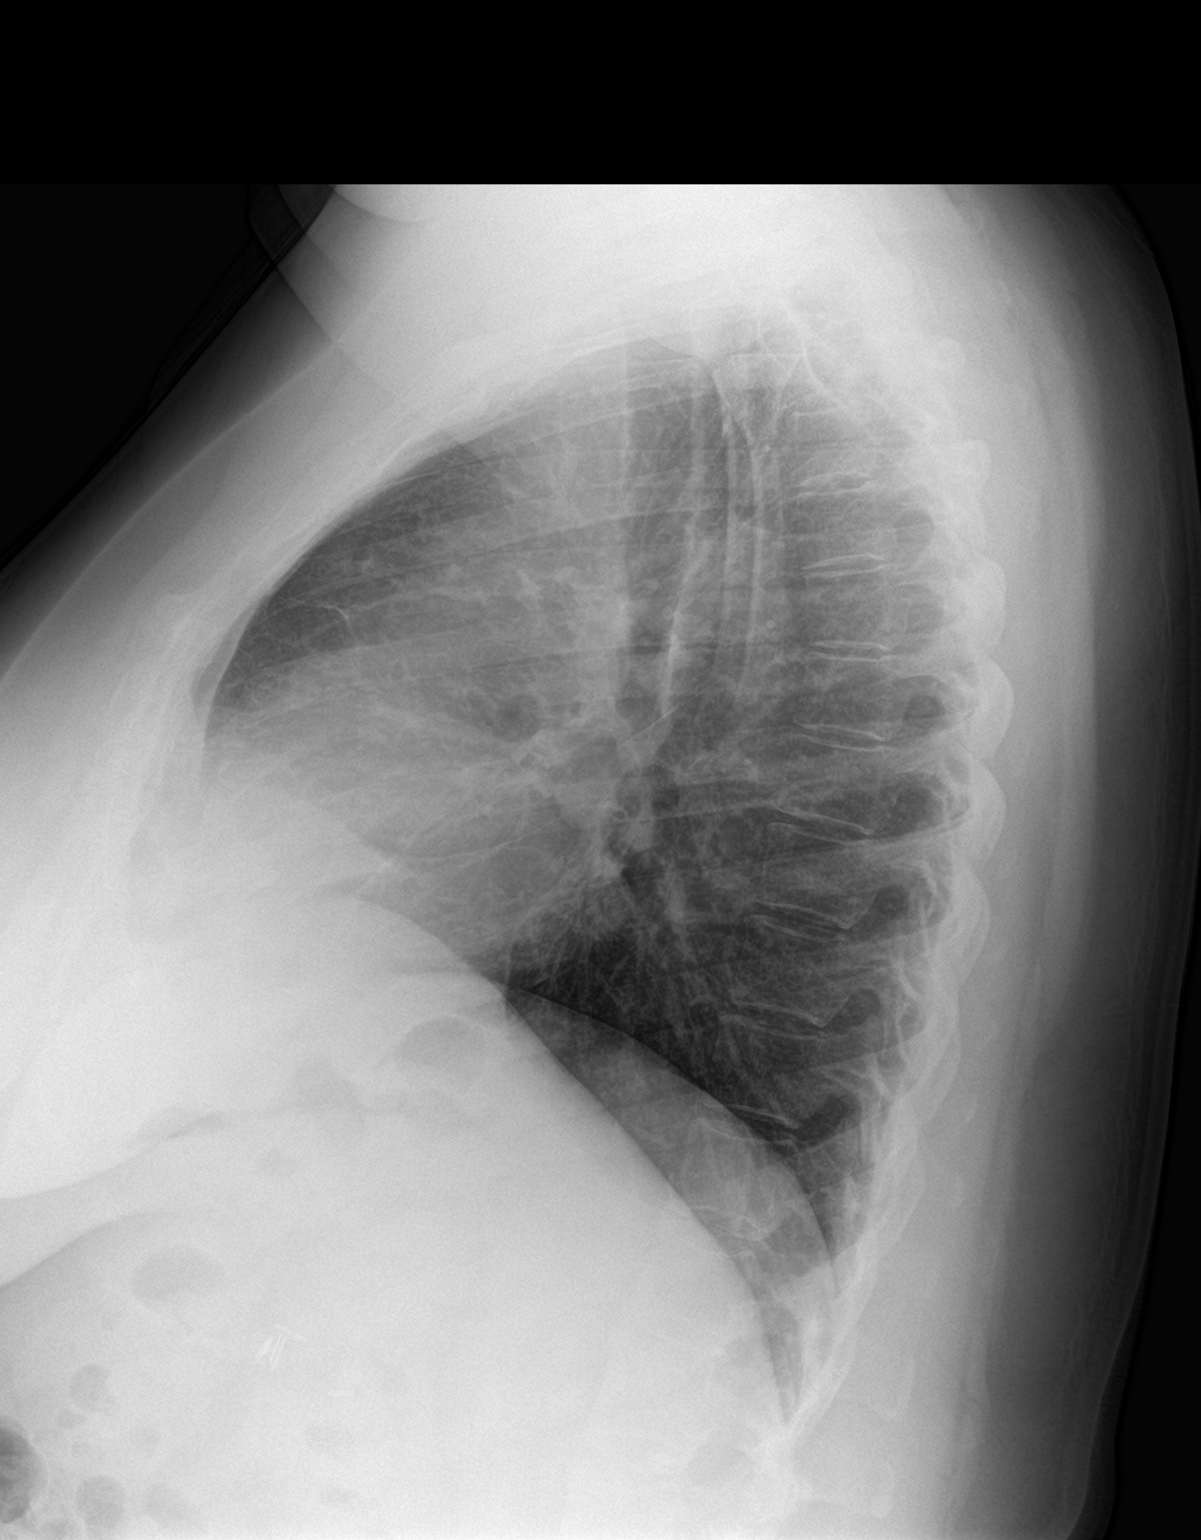

[2 of 2 positions shown; findings below may reference images not displayed]

FINDINGS: The heart size and mediastinal contours are within normal limits.
Mild right middle lobe scarring is stable. No evidence of pulmonary
infiltrate or edema. No evidence of pneumothorax or pleural
effusion. The visualized skeletal structures are unremarkable.
IMPRESSION: Stable exam.  No active cardiopulmonary disease.

## 2016-01-25 MED ORDER — BUDESONIDE-FORMOTEROL FUMARATE 160-4.5 MCG/ACT IN AERO: INHALATION_SPRAY | RESPIRATORY_TRACT | Status: DC

## 2016-01-25 MED ORDER — NEBIVOLOL HCL 5 MG PO TABS: 5.0000 mg | ORAL_TABLET | Freq: Every day | ORAL | Status: DC

## 2016-01-25 NOTE — Patient Instructions (Addendum)
Please remember to go to the lab and x-ray department downstairs for your tests - we will call you with the results when they are available.     Plan A = Automatic = Symbicort 160 Take 2 puffs first thing in am and then another 2 puffs about 12 hours later.      Plan B = Backup Only use your albuterol (proair )as a rescue medication to be used if you can't catch your breath by resting or doing a relaxed purse lip breathing pattern.  - The less you use it, the better it will work when you need it. - Ok to use up to 2 puffs  every 4 hours if you must but call for appointment if use goes up over your usual need - Don't leave home without it !!  (think of it like the spare tire for your car)   Plan C = Crisis - only use your albuterol nebulizer if you first try Plan B and it fails to help > ok to use the nebulizer up to every 4 hours but if start needing it regularly call for immediate appointment  bystolic 5 mg daily until seen   Please schedule a follow up office visit in 6 weeks, call sooner if needed with pfts on return - ok to push back

## 2016-01-25 NOTE — Progress Notes (Signed)
Subjective:     Patient ID: Terri Richardson, female   DOB: [birth year],    MRN: [id number]  HPI  27 yowf never allergies never asthma as child or adult even while smoking but quit smoking 2015 then around fall 2016 indolent onset intermittent sob so referred to pulmonary clinic 01/25/2016 by Dr Boykin Peek.   01/25/2016 1st [facility] office visit/ Masin Shatto   Chief Complaint  Patient presents with  . Pulmonary Consult    Referred by Dr. Lendon Ka for eval of possible asthma. Pt c/o SOB, wheezing and cough for the past several months. She was given prednisone a few wks ago and this helped some. Her cough is prod with minimal yellow sputum.  She is using albuterol inhaler 2-3 x per day.   onset was insidious  initially around  June 2016  when would notice sporadic gradually worsening severe daytime coughing fits but then also developed noct awakening assoc sob better sitting up pr taking saba which always helps some.   All symptoms transiently resolved  While on prednisone then worse back off it again and starting needing albuterol again up to 3 x daily and also hs since last round of pred ended around 2 weeks prior to OV    No obvious day to day or daytime variability or assoc   cp or chest tightness, subjective wheeze or overt sinus or hb symptoms. No unusual exp hx or h/o childhood pna/ asthma or knowledge of premature birth.   Also denies any obvious fluctuation of symptoms with weather or environmental changes or other aggravating or alleviating factors except as outlined above   Current Medications, Allergies, Complete Past Medical History, Past Surgical History, Family History, and Social History were reviewed in [employer] record.  ROS  The following are not active complaints unless bolded sore throat, dysphagia, dental problems, itching, sneezing,  nasal congestion or excess/ purulent secretions, ear ache,   fever, chills, sweats, unintended wt loss, classically  pleuritic or exertional cp, hemoptysis,  orthopnea pnd or leg swelling, presyncope, palpitations, abdominal pain, anorexia, nausea, vomiting, diarrhea  or change in bowel or bladder habits, change in stools or urine, dysuria,hematuria,  rash, arthralgias, visual complaints, headache, numbness, weakness or ataxia or problems with walking or coordination,  change in mood/affect or memory.           Review of Systems     Objective:   Physical Exam    amb wf nad   HEENT: nl dentition, turbinates, and oropharynx. Nl external ear canals without cough reflex   NECK :  without JVD/Nodes/TM/ nl carotid upstrokes bilaterally   LUNGS: no acc muscle use,  Nl contour chest which is clear to A and P bilaterally without cough on insp or exp maneuvers   CV:  RRR  no s3 or murmur or increase in P2, no edema   ABD:  soft and nontender with nl inspiratory excursion in the supine position. No bruits or organomegaly, bowel sounds nl  MS:  Nl gait/ ext warm without deformities, calf tenderness, cyanosis or clubbing No obvious joint restrictions   SKIN: warm and dry without lesions    NEURO:  alert, approp, nl sensorium with  no motor deficits     CXR PA and Lateral:   01/25/2016 :    I personally reviewed images and agree with radiology impression as follows:    The heart size and mediastinal contours are within normal limits. Mild right middle lobe scarring is stable. No evidence  of pulmonary infiltrate or edema. No evidence of pneumothorax or pleural effusion. The visualized skeletal structures are unremarkable.   Labs ordered/ reviewed:      Chemistry      Component Value Date/Time   NA 140 01/03/2016 0920   K 3.2* 01/03/2016 0920   CL 102 01/03/2016 0920   CO2 26 01/03/2016 0920   BUN 12 01/03/2016 0920   CREATININE 0.82 01/03/2016 0920      Component Value Date/Time   CALCIUM 9.3 01/03/2016 0920   ALKPHOS 48 01/03/2016 0920   AST 19 01/03/2016 0920   ALT 23 01/03/2016 0920    BILITOT 0.4 01/03/2016 0920        Lab Results  Component Value Date   WBC 11.2* 01/25/2016   HGB 13.9 01/25/2016   HCT 41.4 01/25/2016   MCV 89.8 01/25/2016   PLT 272.0 01/25/2016      Lab Results  Component Value Date   PROBNP 36.0 01/25/2016       Assessment:

## 2016-01-26 ENCOUNTER — Encounter: Payer: Self-pay | Admitting: Family Medicine

## 2016-01-26 ENCOUNTER — Telehealth: Payer: Self-pay | Admitting: Internal Medicine

## 2016-01-26 ENCOUNTER — Telehealth: Payer: Self-pay | Admitting: Family Medicine

## 2016-01-26 ENCOUNTER — Ambulatory Visit (INDEPENDENT_AMBULATORY_CARE_PROVIDER_SITE_OTHER): Payer: Managed Care, Other (non HMO) | Admitting: Family Medicine

## 2016-01-26 VITALS — BP 148/98 | HR 79 | Temp 99.0°F | Wt 201.3 lb

## 2016-01-26 DIAGNOSIS — J4489 Other specified chronic obstructive pulmonary disease: Secondary | ICD-10-CM

## 2016-01-26 DIAGNOSIS — J45909 Unspecified asthma, uncomplicated: Secondary | ICD-10-CM | POA: Diagnosis not present

## 2016-01-26 DIAGNOSIS — R030 Elevated blood-pressure reading, without diagnosis of hypertension: Secondary | ICD-10-CM

## 2016-01-26 DIAGNOSIS — J449 Chronic obstructive pulmonary disease, unspecified: Secondary | ICD-10-CM

## 2016-01-26 DIAGNOSIS — IMO0001 Reserved for inherently not codable concepts without codable children: Secondary | ICD-10-CM

## 2016-01-26 DIAGNOSIS — E876 Hypokalemia: Secondary | ICD-10-CM | POA: Diagnosis not present

## 2016-01-26 LAB — POTASSIUM: POTASSIUM: 4.1 meq/L (ref 3.5–5.1)

## 2016-01-26 MED ORDER — LOSARTAN POTASSIUM 50 MG PO TABS: 50.0000 mg | ORAL_TABLET | Freq: Every day | ORAL | Status: DC

## 2016-01-26 NOTE — Telephone Encounter (Signed)
Verify LMP in chart due to initiation of losartan.

## 2016-01-26 NOTE — Assessment & Plan Note (Signed)
No evidence chf though certainly at risk with such high bps

## 2016-01-26 NOTE — Progress Notes (Signed)
Quick Note:  Spoke with pt and notified of results per Dr. Wert. Pt verbalized understanding and denied any questions.  ______ 

## 2016-01-26 NOTE — Assessment & Plan Note (Addendum)
-   NO  01/25/2016  =  106   Hx of onset well after stopped smoking is unusual and elevated NO suggests allergic component as does pred responsiveness  DDX of  difficult airways management almost all start with A and  include Adherence, Ace Inhibitors, Acid Reflux, Active Sinus Disease, Alpha 1 Antitripsin deficiency, Anxiety masquerading as Airways dz,  ABPA,  Allergy(esp in young), Aspiration (esp in elderly), Adverse effects of meds,  Active smokers, A bunch of PE's (a small clot burden can't cause this syndrome unless there is already severe underlying pulm or vascular dz with poor reserve) plus two Bs  = Bronchiectasis and Beta blocker use..and one C= CHF  Adherence is always the initial "prime suspect" and is a multilayered concern that requires a "trust but verify" approach in every patient - starting with knowing how to use medications, especially inhalers, correctly, keeping up with refills and understanding the fundamental difference between maintenance and prns vs those medications only taken for a very short course and then stopped and not refilled.   - The proper method of use, as well as anticipated side effects, of a metered-dose inhaler are discussed and demonstrated to the patient. Improved effectiveness after extensive coaching during this visit to a level of approximately 75 % from a baseline of 50 %   ? Allergies > rec ICS/ allergy profile   ? Acid reflux > nothing to suggest but low threshold to treat empirically if continues to be difficult to control   ? Active smoking > denies I reviewed the Fletcher curve with the patient that basically indicates  if you quit smoking when your best day FEV1 is still well preserved (as is apparenlty  the case here)  it is highly unlikely you will progress to severe disease and informed the patient there was no medication on the market that has proven to alter the curve/ its downward trajectory  or the likelihood of progression of their disease.   Therefore stopping smoking and maintaining abstinence is the most important aspect of care, not choice of inhalers or for that matter, doctors.    ? BB effect >  See hbp sep a/p  ? chf > excluded by cxr/ bnp << 100 though orthopnea might suggest element of noct diastolic dysfunction    I had an extended discussion with the patient reviewing all relevant studies completed to date and  lasting 35  minutes of a 60 minute visit    Each maintenance medication was reviewed in detail including most importantly the difference between maintenance and prns and under what circumstances the prns are to be triggered using an action plan format that is not reflected in the computer generated alphabetically organized AVS.    Please see instructions for details which were reviewed in writing and the patient given a copy highlighting the part that I personally wrote and discussed at today's ov.

## 2016-01-26 NOTE — Progress Notes (Signed)
Pre visit review using our clinic review tool, if applicable. No additional management support is needed unless otherwise documented below in the visit note. 

## 2016-01-26 NOTE — Patient Instructions (Signed)
Stop Bystolic today and start losartan 50mg  daily. Omron blood pressure cuff is recommended and daily monitoring with report of BP >140/90 to clinic.  Please stop by lab before you leave for blood work and results will be called to you within one week or sooner if needed.  It was a pleasure seeing you today, please follow up in one week for BP follow up and medication change.  DASH Eating Plan DASH stands for "Dietary Approaches to Stop Hypertension." The DASH eating plan is a healthy eating plan that has been shown to reduce high blood pressure (hypertension). Additional health benefits may include reducing the risk of type 2 diabetes mellitus, heart disease, and stroke. The DASH eating plan may also help with weight loss. WHAT DO I NEED TO KNOW ABOUT THE DASH EATING PLAN? For the DASH eating plan, you will follow these general guidelines:  Choose foods with a percent daily value for sodium of less than 5% (as listed on the food label).  Use salt-free seasonings or herbs instead of table salt or sea salt.  Check with your health care provider or pharmacist before using salt substitutes.  Eat lower-sodium products, often labeled as "lower sodium" or "no salt added."  Eat fresh foods.  Eat more vegetables, fruits, and low-fat dairy products.  Choose whole grains. Look for the word "whole" as the first word in the ingredient list.  Choose fish and skinless chicken or [place]key more often than red meat. Limit fish, poultry, and meat to 6 oz (170 g) each day.  Limit sweets, desserts, sugars, and sugary drinks.  Choose heart-healthy fats.  Limit cheese to 1 oz (28 g) per day.  Eat more home-cooked food and less restaurant, buffet, and fast food.  Limit fried foods.  Cook foods using methods other than frying.  Limit canned vegetables. If you do use them, rinse them well to decrease the sodium.  When eating at a restaurant, ask that your food be prepared with less salt, or no salt if  possible. WHAT FOODS CAN I EAT? Seek help from a dietitian for individual calorie needs. Grains Whole grain or whole wheat bread. Brown rice. Whole grain or whole wheat pasta. Quinoa, bulgur, and whole grain cereals. Low-sodium cereals. Corn or whole wheat flour tortillas. Whole grain cornbread. Whole grain crackers. Low-sodium crackers. Vegetables Fresh or frozen vegetables (raw, steamed, roasted, or grilled). Low-sodium or reduced-sodium tomato and vegetable juices. Low-sodium or reduced-sodium tomato sauce and paste. Low-sodium or reduced-sodium canned vegetables.  Fruits All fresh, canned (in natural juice), or frozen fruits. Meat and Other Protein Products Ground beef (85% or leaner), grass-fed beef, or beef trimmed of fat. Skinless chicken or [place]key. Ground chicken or [place]key. Pork trimmed of fat. All fish and seafood. Eggs. Dried beans, peas, or lentils. Unsalted nuts and seeds. Unsalted canned beans. Dairy Low-fat dairy products, such as skim or 1% milk, 2% or reduced-fat cheeses, low-fat ricotta or cottage cheese, or plain low-fat yogurt. Low-sodium or reduced-sodium cheeses. Fats and Oils Tub margarines without trans fats. Light or reduced-fat mayonnaise and salad dressings (reduced sodium). Avocado. Safflower, olive, or canola oils. Natural peanut or almond butter. Other Unsalted popcorn and pretzels. The items listed above may not be a complete list of recommended foods or beverages. Contact your dietitian for more options. WHAT FOODS ARE NOT RECOMMENDED? Grains White bread. White pasta. White rice. Refined cornbread. Bagels and croissants. Crackers that contain trans fat. Vegetables Creamed or fried vegetables. Vegetables in a cheese sauce. Regular canned vegetables.  Regular canned tomato sauce and paste. Regular tomato and vegetable juices. Fruits Dried fruits. Canned fruit in light or heavy syrup. Fruit juice. Meat and Other Protein Products Fatty cuts of meat. Ribs, chicken  wings, bacon, sausage, bologna, salami, chitterlings, fatback, hot dogs, bratwurst, and packaged luncheon meats. Salted nuts and seeds. Canned beans with salt. Dairy Whole or 2% milk, cream, half-and-half, and cream cheese. Whole-fat or sweetened yogurt. Full-fat cheeses or blue cheese. Nondairy creamers and whipped toppings. Processed cheese, cheese spreads, or cheese curds. Condiments Onion and garlic salt, seasoned salt, table salt, and sea salt. Canned and packaged gravies. Worcestershire sauce. Tartar sauce. Barbecue sauce. Teriyaki sauce. Soy sauce, including reduced sodium. Steak sauce. Fish sauce. Oyster sauce. Cocktail sauce. Horseradish. Ketchup and mustard. Meat flavorings and tenderizers. Bouillon cubes. Hot sauce. Tabasco sauce. Marinades. Taco seasonings. Relishes. Fats and Oils Butter, stick margarine, lard, shortening, ghee, and bacon fat. Coconut, palm kernel, or palm oils. Regular salad dressings. Other Pickles and olives. Salted popcorn and pretzels. The items listed above may not be a complete list of foods and beverages to avoid. Contact your dietitian for more information. WHERE CAN I FIND MORE INFORMATION? National Heart, Lung, and Blood Institute: travelstabloid.com   This information is not intended to replace advice given to you by your health care provider. Make sure you discuss any questions you have with your health care provider.   Document Released: 09/28/2011 Document Revised: 10/30/2014 Document Reviewed: 08/13/2013 Elsevier Interactive Patient Education [employer].

## 2016-01-26 NOTE — Progress Notes (Signed)
Subjective:    Patient ID: Terri BlackerSylvia Ann Richardson, female    DOB: Sep 28, 1971, 45 y.o.   MRN: [id number]  HPI  [name] is a 45 year old female who presents today for follow up for elevated BP at pulmonology clinic. In pulmonology clinic BP was noted as 200/140 and patient was provided bystolic 5mg  in office and BP was noted after medication as 160/106. She is followed by pulmonology for asthmatic bronchitis and has been started on symbicort with a follow up in 6 weeks for evaluation of symptoms. Today, initial BP was 150/100 and retake of BP is 148/98. Patient has not taken bystolic this morning as she was waiting to take medication after she was evaluated in the clinic. She denies chest pain, SOB, palpitations, headaches, numbness, tingling, and nosebleeds.  She reports using "[name]" for seasoning and has decreased her salt intake.  She reports using symbicort today and denies adverse effects. She also denies SOB and wheezing today. Prior prednisone therapy has improved her cough and SOB. No use of rescue inhaler today and patient verbalized the importance of not using this medication unless absolutely necessary.  Review of Systems  Constitutional: Negative for fever, chills and fatigue.  HENT: Positive for sneezing. Negative for congestion, nosebleeds, postnasal drip, rhinorrhea, sinus pressure and sore throat.   Eyes: Negative for itching.  Respiratory: Positive for cough. Negative for chest tightness, shortness of breath and wheezing.   Cardiovascular: Negative for chest pain, palpitations and leg swelling.  Gastrointestinal: Negative for nausea, vomiting, diarrhea, constipation and blood in stool.  Genitourinary: Negative for dysuria, urgency, hematuria and flank pain.  Musculoskeletal: Negative for myalgias and arthralgias.  Skin: Negative for rash.  Allergic/Immunologic: Negative for food allergies.  Neurological: Negative for dizziness, light-headedness and headaches.    Psychiatric/Behavioral:       Denies depressed or anxious mood   Past Medical History  Diagnosis Date  . Diabetes mellitus 2006    Social History   Social History  . Marital Status: Married    Spouse Name: N/A  . Number of Children: N/A  . Years of Education: N/A   Occupational History  . Works at [employer]    Social History Main Topics  . Smoking status: Former Smoker -- 0.50 packs/day for 4 years    Types: Cigarettes    Quit date: 02/20/2014  . Smokeless tobacco: Never Used  . Alcohol Use: No  . Drug Use: No  . Sexual Activity: Yes    Birth Control/ Protection: None   Other Topics Concern  . Not on file   Social History Narrative    Past Surgical History  Procedure Laterality Date  . Cesarean section  2008  . Tonsillectomy  6th grade  . Appendectomy  1st grade  . Cholecystectomy  11/11/2011    Procedure: LAPAROSCOPIC CHOLECYSTECTOMY WITH INTRAOPERATIVE CHOLANGIOGRAM;  Surgeon: Jetty DuhamelJames O Wyatt III, MD;  Location: [facility] OR;  Service: General;  Laterality: N/A;    Family History  Problem Relation Age of Onset  . Hyperlipidemia Mother   . Hypertension Father   . Tongue cancer Maternal Grandfather   . Prostate cancer Paternal Uncle   . Kidney cancer Maternal Uncle   . Heart disease Brother     No Known Allergies  Current Outpatient Prescriptions on File Prior to Visit  Medication Sig Dispense Refill  . albuterol (PROVENTIL HFA;VENTOLIN HFA) 108 (90 Base) MCG/ACT inhaler Inhale 2 puffs into the lungs every 6 (six) hours as needed for wheezing or  shortness of breath. 1 Inhaler 0  . budesonide-formoterol (SYMBICORT) 160-4.5 MCG/ACT inhaler Take 2 puffs first thing in am and then another 2 puffs about 12 hours later. 1 Inhaler 11   No current facility-administered medications on file prior to visit.    BP 148/98 mmHg  Pulse 79  Temp(Src) 99 F (37.2 C) (Oral)  Wt 201 lb 4.8 oz (91.309 kg)  LMP 11/30/2015 (Approximate)       Objective:   Physical Exam   Constitutional: She is oriented to person, place, and time. She appears well-developed and well-nourished.  Eyes: Pupils are equal, round, and reactive to light. No scleral icterus.  Neck: Normal range of motion.  Cardiovascular: Normal rate, regular rhythm and intact distal pulses.  Exam reveals no gallop and no friction rub.   No murmur heard. Pulmonary/Chest: Effort normal and breath sounds normal. She has no wheezes. She has no rales.  Abdominal: Soft. Bowel sounds are normal. She exhibits no distension. There is no tenderness.  Musculoskeletal: She exhibits no edema.  Lymphadenopathy:    She has no cervical adenopathy.  Neurological: She is alert and oriented to person, place, and time.  Skin: Skin is warm and dry. No rash noted.  Psychiatric: She has a normal mood and affect. Her behavior is normal. Thought content normal.       Assessment & Plan:  1. Elevated blood pressure Patient advised to discontinue Bystolic today and start losartan with recommendations for home blood pressure monitoring and follow up within one week. Due to elevated level noted in pulmonology office yesterday and elevated level today, BP medication instituted prior to home monitoring for one week. Advised patient risks with new medication versus no medication. - losartan (COZAAR) 50 MG tablet; Take 1 tablet (50 mg total) by mouth daily.  Dispense: 30 tablet; Refill: 1  2. Asthmatic bronchitis , chronic (HCC) Continue symbicort as prescribed by pulmonology and follow up as indicated in 6 weeks  3. Low serum potassium level Recheck potassium level today. Patient was advised to have this completed earlier, however she stated that she forgot to do this but will do so today. She will be contacted with her results within one week or sooner if needed. - Potassium

## 2016-01-26 NOTE — Progress Notes (Signed)
Quick Note:  LMTCB ______ 

## 2016-01-26 NOTE — Assessment & Plan Note (Addendum)
Strongly prefer in this setting: Bystolic, the most beta -1  selective Beta blocker available in sample form, with bisoprolol the most selective generic choice  on the market if a BB is chosen at all   Given bystolic 5 mg at arrival > bp down to 160/106 prior to departure  For now rx with bystolic 5 mg daily with marked improvement by the time she left the office documented and .Follow up per Primary Care planned

## 2016-01-26 NOTE — Telephone Encounter (Signed)
Spoke with the pt and notified of results per MW  She verbalized understanding and denied any questions

## 2016-01-27 LAB — RESPIRATORY ALLERGY PROFILE REGION II ~~LOC~~
Allergen, Cedar tree, t12: <0.1 kU/L
Allergen, Cottonwood, t14: <0.1 kU/L
Allergen, D pternoyssinus,d7: <0.1 kU/L
Allergen, Mulberry, t76: <0.1 kU/L
Alternaria Alternata: <0.1 kU/L
Aspergillus fumigatus, m3: <0.1 kU/L
Bermuda Grass: <0.1 kU/L
Cat Dander: <0.1 kU/L
Cladosporium Herbarum: <0.1 kU/L
D. farinae: <0.1 kU/L
Dog Dander: <0.1 kU/L
IGE (IMMUNOGLOBULIN E), SERUM: 10 kU/L (ref <115)
Johnson Grass: <0.1 kU/L
Sheep Sorrel IgE: <0.1 kU/L

## 2016-01-27 NOTE — Progress Notes (Signed)
Quick Note:  Spoke with pt and notified of results per Dr. Wert. Pt verbalized understanding and denied any questions.  ______ 

## 2016-01-27 NOTE — Telephone Encounter (Signed)
Spoke with pt and she doesn't remember when it was but she think is was the last week of February and she is not regular. Also pt states she is not sexually active for the moment

## 2016-02-03 ENCOUNTER — Encounter: Payer: Self-pay | Admitting: Family Medicine

## 2016-02-03 ENCOUNTER — Ambulatory Visit (INDEPENDENT_AMBULATORY_CARE_PROVIDER_SITE_OTHER): Payer: Managed Care, Other (non HMO) | Admitting: Family Medicine

## 2016-02-03 VITALS — BP 148/100 | HR 84 | Temp 98.5°F | Wt 201.3 lb

## 2016-02-03 DIAGNOSIS — I10 Essential (primary) hypertension: Secondary | ICD-10-CM

## 2016-02-03 DIAGNOSIS — J45909 Unspecified asthma, uncomplicated: Secondary | ICD-10-CM

## 2016-02-03 MED ORDER — LOSARTAN POTASSIUM 50 MG PO TABS: 100.0000 mg | ORAL_TABLET | Freq: Every day | ORAL | Status: DC

## 2016-02-03 NOTE — Patient Instructions (Signed)
Please take 2 tablets of Losartan today and increase dose to 100 mg daily. Record BP for one week and RTC for further evaluation and follow up.  DASH Eating Plan DASH stands for "Dietary Approaches to Stop Hypertension." The DASH eating plan is a healthy eating plan that has been shown to reduce high blood pressure (hypertension). Additional health benefits may include reducing the risk of type 2 diabetes mellitus, heart disease, and stroke. The DASH eating plan may also help with weight loss. WHAT DO I NEED TO KNOW ABOUT THE DASH EATING PLAN? For the DASH eating plan, you will follow these general guidelines:  Choose foods with a percent daily value for sodium of less than 5% (as listed on the food label).  Use salt-free seasonings or herbs instead of table salt or sea salt.  Check with your health care provider or pharmacist before using salt substitutes.  Eat lower-sodium products, often labeled as "lower sodium" or "no salt added."  Eat fresh foods.  Eat more vegetables, fruits, and low-fat dairy products.  Choose whole grains. Look for the word "whole" as the first word in the ingredient list.  Choose fish and skinless chicken or [place]key more often than red meat. Limit fish, poultry, and meat to 6 oz (170 g) each day.  Limit sweets, desserts, sugars, and sugary drinks.  Choose heart-healthy fats.  Limit cheese to 1 oz (28 g) per day.  Eat more home-cooked food and less restaurant, buffet, and fast food.  Limit fried foods.  Cook foods using methods other than frying.  Limit canned vegetables. If you do use them, rinse them well to decrease the sodium.  When eating at a restaurant, ask that your food be prepared with less salt, or no salt if possible. WHAT FOODS CAN I EAT? Seek help from a dietitian for individual calorie needs. Grains Whole grain or whole wheat bread. Brown rice. Whole grain or whole wheat pasta. Quinoa, bulgur, and whole grain cereals. Low-sodium  cereals. Corn or whole wheat flour tortillas. Whole grain cornbread. Whole grain crackers. Low-sodium crackers. Vegetables Fresh or frozen vegetables (raw, steamed, roasted, or grilled). Low-sodium or reduced-sodium tomato and vegetable juices. Low-sodium or reduced-sodium tomato sauce and paste. Low-sodium or reduced-sodium canned vegetables.  Fruits All fresh, canned (in natural juice), or frozen fruits. Meat and Other Protein Products Ground beef (85% or leaner), grass-fed beef, or beef trimmed of fat. Skinless chicken or [place]key. Ground chicken or [place]key. Pork trimmed of fat. All fish and seafood. Eggs. Dried beans, peas, or lentils. Unsalted nuts and seeds. Unsalted canned beans. Dairy Low-fat dairy products, such as skim or 1% milk, 2% or reduced-fat cheeses, low-fat ricotta or cottage cheese, or plain low-fat yogurt. Low-sodium or reduced-sodium cheeses. Fats and Oils Tub margarines without trans fats. Light or reduced-fat mayonnaise and salad dressings (reduced sodium). Avocado. Safflower, olive, or canola oils. Natural peanut or almond butter. Other Unsalted popcorn and pretzels. The items listed above may not be a complete list of recommended foods or beverages. Contact your dietitian for more options. WHAT FOODS ARE NOT RECOMMENDED? Grains White bread. White pasta. White rice. Refined cornbread. Bagels and croissants. Crackers that contain trans fat. Vegetables Creamed or fried vegetables. Vegetables in a cheese sauce. Regular canned vegetables. Regular canned tomato sauce and paste. Regular tomato and vegetable juices. Fruits Dried fruits. Canned fruit in light or heavy syrup. Fruit juice. Meat and Other Protein Products Fatty cuts of meat. Ribs, chicken wings, bacon, sausage, bologna, salami, chitterlings, fatback, hot dogs,  bratwurst, and packaged luncheon meats. Salted nuts and seeds. Canned beans with salt. Dairy Whole or 2% milk, cream, half-and-half, and cream cheese.  Whole-fat or sweetened yogurt. Full-fat cheeses or blue cheese. Nondairy creamers and whipped toppings. Processed cheese, cheese spreads, or cheese curds. Condiments Onion and garlic salt, seasoned salt, table salt, and sea salt. Canned and packaged gravies. Worcestershire sauce. Tartar sauce. Barbecue sauce. Teriyaki sauce. Soy sauce, including reduced sodium. Steak sauce. Fish sauce. Oyster sauce. Cocktail sauce. Horseradish. Ketchup and mustard. Meat flavorings and tenderizers. Bouillon cubes. Hot sauce. Tabasco sauce. Marinades. Taco seasonings. Relishes. Fats and Oils Butter, stick margarine, lard, shortening, ghee, and bacon fat. Coconut, palm kernel, or palm oils. Regular salad dressings. Other Pickles and olives. Salted popcorn and pretzels. The items listed above may not be a complete list of foods and beverages to avoid. Contact your dietitian for more information. WHERE CAN I FIND MORE INFORMATION? National Heart, Lung, and Blood Institute: travelstabloid.com   This information is not intended to replace advice given to you by your health care provider. Make sure you discuss any questions you have with your health care provider.   Document Released: 09/28/2011 Document Revised: 10/30/2014 Document Reviewed: 08/13/2013 Elsevier Interactive Patient Education [employer].

## 2016-02-03 NOTE — Progress Notes (Signed)
Pre visit review using our clinic review tool, if applicable. No additional management support is needed unless otherwise documented below in the visit note. 

## 2016-02-03 NOTE — Progress Notes (Signed)
Subjective:    Patient ID: Terri Richardson, female    DOB: 1971-06-28, 45 y.o.   MRN: [id number]  HPI  Terri Richardson is a 45 year old female who presents today for a BP follow up after starting Losartan 50 mg daily on 01/26/16 . Today, Terri Richardson reports taking her BP medication in the afternoon yesterday and has not taken her medication today. Also, Terri Richardson reports taking her BP at home between 8:00pm and 9:30pm with a home cuff.  BP at home has ranged between 156-180 systolic and 98-100 diastolic per patient.  Terri Richardson denies any visual disturbances, HA, SOB, chest pain, palpitation, numbness, tingling, or nosebleeds. Terri Richardson did not bring her home BP cuff in today but states Terri Richardson will do so for her next visit. Terri Richardson denies any adverse effects from losartan at this time.  Terri Richardson notes using her rescue inhaler 2 times since Terri Richardson started symbicort on 01/25/16 after working outside. Terri Richardson is followed by pulmonology and has a follow up appointment at the end of this month. Terri Richardson denies SOB, wheezing, cough, or DOE and reports that symbicort has provided excellent benefit.   Review of Systems  Constitutional: Negative for fever, chills and fatigue.  HENT: Negative for congestion, nosebleeds, postnasal drip, rhinorrhea, sinus pressure, sneezing and sore throat.   Eyes: Negative for visual disturbance.  Respiratory: Negative for cough, chest tightness, shortness of breath and wheezing.   Cardiovascular: Negative for chest pain, palpitations and leg swelling.  Gastrointestinal: Negative for nausea, vomiting, abdominal pain, diarrhea and constipation.  Endocrine: Negative for polydipsia, polyphagia and polyuria.  Genitourinary: Negative for dysuria, frequency, hematuria and flank pain.  Musculoskeletal: Negative for myalgias and arthralgias.  Skin: Negative for rash.  Allergic/Immunologic: Positive for environmental allergies.  Neurological: Negative for dizziness, light-headedness, numbness and headaches.  Psychiatric/Behavioral:  Negative for agitation. The patient is not nervous/anxious.    Past Medical History  Diagnosis Date  . Diabetes mellitus 2006    Social History   Social History  . Marital Status: Married    Spouse Name: N/A  . Number of Children: N/A  . Years of Education: N/A   Occupational History  . Works at [employer]    Social History Main Topics  . Smoking status: Former Smoker -- 0.50 packs/day for 4 years    Types: Cigarettes    Quit date: 02/20/2014  . Smokeless tobacco: Never Used  . Alcohol Use: No  . Drug Use: No  . Sexual Activity: Yes    Birth Control/ Protection: None   Other Topics Concern  . Not on file   Social History Narrative    Past Surgical History  Procedure Laterality Date  . Cesarean section  2008  . Tonsillectomy  6th grade  . Appendectomy  1st grade  . Cholecystectomy  11/11/2011    Procedure: LAPAROSCOPIC CHOLECYSTECTOMY WITH INTRAOPERATIVE CHOLANGIOGRAM;  Surgeon: Jetty DuhamelJames O Wyatt III, MD;  Location: [facility] OR;  Service: General;  Laterality: N/A;    Family History  Problem Relation Age of Onset  . Hyperlipidemia Mother   . Hypertension Father   . Tongue cancer Maternal Grandfather   . Prostate cancer Paternal Uncle   . Kidney cancer Maternal Uncle   . Heart disease Brother     No Known Allergies  Current Outpatient Prescriptions on File Prior to Visit  Medication Sig Dispense Refill  . albuterol (PROVENTIL HFA;VENTOLIN HFA) 108 (90 Base) MCG/ACT inhaler Inhale 2 puffs into the lungs every 6 (six) hours as needed for wheezing or  shortness of breath. 1 Inhaler 0  . budesonide-formoterol (SYMBICORT) 160-4.5 MCG/ACT inhaler Take 2 puffs first thing in am and then another 2 puffs about 12 hours later. 1 Inhaler 11   No current facility-administered medications on file prior to visit.    BP 148/100 mmHg  Pulse 84  Temp(Src) 98.5 F (36.9 C) (Oral)  Wt 201 lb 4.8 oz (91.309 kg)  LMP 11/30/2015 (Approximate)       Objective:   Physical Exam    Constitutional: Terri Richardson is oriented to person, place, and time. Terri Richardson appears well-developed and well-nourished.  Eyes: Pupils are equal, round, and reactive to light.  Cardiovascular: Normal rate, regular rhythm, normal heart sounds and intact distal pulses.   Pulmonary/Chest: Effort normal and breath sounds normal.  Abdominal: Soft. Bowel sounds are normal. There is no tenderness.  Musculoskeletal: Terri Richardson exhibits no edema.  Lymphadenopathy:    Terri Richardson has no cervical adenopathy.  Neurological: Terri Richardson is alert and oriented to person, place, and time.  Skin: Skin is warm and dry. No rash noted.  Psychiatric: Terri Richardson has a normal mood and affect. Her behavior is normal. Judgment and thought content normal.       Assessment & Plan:  1. Essential hypertension Increase losartan to 100 mg daily and continue monitoring BP at home for one week. Terri Richardson denies being sexually active and the risks of becoming pregnant on this medication have been discussed with the patient. Terri Richardson voiced understanding. BP follow up and evaluation in one week and Terri Richardson was advised to report any BP >140/90 and bring her home BP cuff in with her to her next visit. Also, Terri Richardson was advised to seek medical attention immediately if Terri Richardson notices any symptoms such as visual disturbances, HA, chest pain, palpitations, SOB, and nosebleeds.  Due to a history of a low potassium level, hydrochlorothiazide was not initially implemented.   - losartan (COZAAR) 50 MG tablet; Take 2 tablets (100 mg total) by mouth daily.  Dispense: 60 tablet; Refill: 1  2. Asthmatic bronchitis, unspecified asthma severity, uncomplicated Continue symbicort as prescribed and follow up with pulmonology as scheduled.

## 2016-02-10 ENCOUNTER — Ambulatory Visit (INDEPENDENT_AMBULATORY_CARE_PROVIDER_SITE_OTHER): Payer: Managed Care, Other (non HMO) | Admitting: Family Medicine

## 2016-02-10 ENCOUNTER — Encounter: Payer: Self-pay | Admitting: Family Medicine

## 2016-02-10 VITALS — BP 148/92 | HR 99 | Temp 98.7°F | Wt 200.6 lb

## 2016-02-10 DIAGNOSIS — I10 Essential (primary) hypertension: Secondary | ICD-10-CM

## 2016-02-10 DIAGNOSIS — J4520 Mild intermittent asthma, uncomplicated: Secondary | ICD-10-CM

## 2016-02-10 MED ORDER — AMLODIPINE BESYLATE 2.5 MG PO TABS: 2.5000 mg | ORAL_TABLET | Freq: Every day | ORAL | Status: DC

## 2016-02-10 NOTE — Patient Instructions (Addendum)
Continue losartan 100mg  daily and add amlodipine 2.5 mg daily. Continue monitoring BP at home and record readings to bring to your next visit with your BP cuff at home. Report readings >140/90 and if you develop chest pain, SOB, palpitations, headaches, nosebleeds, or any new symptoms, please seek medical attention immediately. Please follow up in 2 weeks for evaluation of BP management. Continue monitoring salt intake and focus on exercise and diet.

## 2016-02-10 NOTE — Progress Notes (Signed)
Subjective:    Patient ID: Kallyn Demarcus, female    DOB: Jan 05, 1971, 45 y.o.   MRN: [id number]  HPI  Ms. Student is a 45 year old female who presents today for a BP follow up after starting Losartan 50 mg daily on 01/26/16 and then increasing dose to  daily on 02/03/16.   Today, she reports taking her BP medication in the morning she has taken her medication today. Also, she reports taking her BP at home between in the evening with a home cuff. BP at home has ranged between 130-160 systolic and 80-100 diastolic per patient. Average readings are noted in the mid 140s systolic and low to mid 90s diastolic. She denies any visual disturbances, HA, SOB, chest pain, palpitation, numbness, tingling, or nosebleeds. She did not bring her home BP cuff in today but states she will do so for her next visit. She denies any adverse effects from losartan at this time.  She notes using her rescue inhaler 2 times since she started symbicort on 01/25/16 after working outside. She is followed by pulmonology and has a follow up appointment at the end of this month. She denies SOB, wheezing, cough, or DOE and reports that symbicort has provided excellent benefit.   Review of Systems  Constitutional: Negative for fever, chills and fatigue.  Eyes: Negative for visual disturbance.  Respiratory: Negative for cough, shortness of breath and wheezing.   Cardiovascular: Negative for chest pain, palpitations and leg swelling.  Neurological: Negative for headaches.   Past Medical History  Diagnosis Date  . Diabetes mellitus 2006     Social History   Social History  . Marital Status: Married    Spouse Name: N/A  . Number of Children: N/A  . Years of Education: N/A   Occupational History  . Works at [employer]    Social History Main Topics  . Smoking status: Former Smoker -- 0.50 packs/day for 4 years    Types: Cigarettes    Quit date: 02/20/2014  . Smokeless tobacco: Never Used  . Alcohol Use: No  .  Drug Use: No  . Sexual Activity: Yes    Birth Control/ Protection: None   Other Topics Concern  . Not on file   Social History Narrative    Past Surgical History  Procedure Laterality Date  . Cesarean section  2008  . Tonsillectomy  6th grade  . Appendectomy  1st grade  . Cholecystectomy  11/11/2011    Procedure: LAPAROSCOPIC CHOLECYSTECTOMY WITH INTRAOPERATIVE CHOLANGIOGRAM;  Surgeon: Jetty Duhamel, MD;  Location: [facility] OR;  Service: General;  Laterality: N/A;    Family History  Problem Relation Age of Onset  . Hyperlipidemia Mother   . Hypertension Father   . Tongue cancer Maternal Grandfather   . Prostate cancer Paternal Uncle   . Kidney cancer Maternal Uncle   . Heart disease Brother     No Known Allergies  Current Outpatient Prescriptions on File Prior to Visit  Medication Sig Dispense Refill  . albuterol (PROVENTIL HFA;VENTOLIN HFA) 108 (90 Base) MCG/ACT inhaler Inhale 2 puffs into the lungs every 6 (six) hours as needed for wheezing or shortness of breath. 1 Inhaler 0  . budesonide-formoterol (SYMBICORT) 160-4.5 MCG/ACT inhaler Take 2 puffs first thing in am and then another 2 puffs about 12 hours later. 1 Inhaler 11  . losartan (COZAAR) 50 MG tablet Take 2 tablets (100 mg total) by mouth daily. 60 tablet 1   No current facility-administered medications on  file prior to visit.    BP 148/92 mmHg  Pulse 99  Temp(Src) 98.7 F (37.1 C) (Oral)  Wt 200 lb 9.6 oz (90.992 kg)  LMP 11/30/2015 (Approximate)       Objective:   Physical Exam  Constitutional: She is oriented to person, place, and time. She appears well-developed and well-nourished.  Eyes: Pupils are equal, round, and reactive to light.  Neck: Neck supple.  Cardiovascular: Normal rate, regular rhythm, normal heart sounds and intact distal pulses.  Exam reveals no gallop and no friction rub.   No murmur heard. Pulmonary/Chest: Effort normal and breath sounds normal. She has no wheezes. She has no  rales.  Abdominal: Soft. Bowel sounds are normal. There is no tenderness.  Musculoskeletal: She exhibits no edema.  Lymphadenopathy:    She has no cervical adenopathy.  Neurological: She is alert and oriented to person, place, and time.  Skin: Skin is warm and dry. No rash noted.  Psychiatric: She has a normal mood and affect. Her behavior is normal. Judgment and thought content normal.       Assessment & Plan:  1. Essential hypertension Continue losartan 100 mg daily and add amlodipine 2.5 mg daily. Continue monitoring BP at home for two weeks, document readings, and return for follow up with readings and home BP cuff. Discussed second agent for BP control and the differing mechanisms of action by both agents. Discussed adverse risks versus benefits of additional agent to control BP.  - amLODipine (NORVASC) 2.5 MG tablet; Take 1 tablet (2.5 mg total) by mouth daily.  Dispense: 30 tablet; Refill: 1   2. Asthmatic bronchitis, mild intermittent, uncomplicated Continue symbicort as prescribed and follow up with pulmonology as scheduled. Advised patient that albuterol is a rescue inhaler and if she is using this more than 2 times/week, she should follow up with pulmonology sooner if needed.   Patient agreed with plan and verbalized understanding of new agent added for BP control.  Roddie McJulia Maureen Duesing, FNP-C

## 2016-02-10 NOTE — Progress Notes (Signed)
Pre visit review using our clinic review tool, if applicable. No additional management support is needed unless otherwise documented below in the visit note. 

## 2016-02-21 ENCOUNTER — Ambulatory Visit: Payer: Managed Care, Other (non HMO) | Admitting: Family Medicine

## 2016-03-14 ENCOUNTER — Encounter: Payer: Self-pay | Admitting: Internal Medicine

## 2016-03-14 ENCOUNTER — Ambulatory Visit (INDEPENDENT_AMBULATORY_CARE_PROVIDER_SITE_OTHER): Payer: Managed Care, Other (non HMO) | Admitting: Internal Medicine

## 2016-03-14 VITALS — BP 136/98 | HR 92 | Ht 62.0 in | Wt 207.0 lb

## 2016-03-14 DIAGNOSIS — R0600 Dyspnea, unspecified: Secondary | ICD-10-CM | POA: Diagnosis not present

## 2016-03-14 DIAGNOSIS — J449 Chronic obstructive pulmonary disease, unspecified: Secondary | ICD-10-CM

## 2016-03-14 DIAGNOSIS — I10 Essential (primary) hypertension: Secondary | ICD-10-CM | POA: Diagnosis not present

## 2016-03-14 DIAGNOSIS — J45909 Unspecified asthma, uncomplicated: Secondary | ICD-10-CM | POA: Diagnosis not present

## 2016-03-14 LAB — PULMONARY FUNCTION TEST
DL/VA % PRED: 125 %
DL/VA: 5.7 ml/min/mmHg/L
DLCO COR: 24.4 ml/min/mmHg
DLCO cor % pred: 113 %
DLCO unc % pred: 116 %
DLCO unc: 25.05 ml/min/mmHg
FEF 25-75 Post: 2.54 L/sec
FEF 25-75 Pre: 1.4 L/sec
FEF2575-%CHANGE-POST: 81 %
FEF2575-%PRED-PRE: 48 %
FEF2575-%Pred-Post: 87 %
FEV1-%CHANGE-POST: 14 %
FEV1-%PRED-PRE: 69 %
FEV1-%Pred-Post: 79 %
FEV1-Post: 2.21 L
FEV1-Pre: 1.93 L
FEV1FVC-%CHANGE-POST: 0 %
FEV1FVC-%Pred-Pre: 91 %
FEV6-%CHANGE-POST: 14 %
FEV6-%Pred-Post: 88 %
FEV6-%Pred-Pre: 77 %
FEV6-PRE: 2.58 L
FEV6-Post: 2.94 L
FEV6FVC-%PRED-PRE: 102 %
FEV6FVC-%Pred-Post: 102 %
FVC-%Change-Post: 14 %
FVC-%PRED-POST: 86 %
FVC-%Pred-Pre: 75 %
FVC-PRE: 2.58 L
FVC-Post: 2.94 L
POST FEV1/FVC RATIO: 75 %
PRE FEV6/FVC RATIO: 100 %
Post FEV6/FVC ratio: 100 %
Pre FEV1/FVC ratio: 75 %
RV % pred: 121 %
RV: 1.89 L
TLC % pred: 107 %
TLC: 5.11 L

## 2016-03-14 MED ORDER — BUDESONIDE-FORMOTEROL FUMARATE 80-4.5 MCG/ACT IN AERO: INHALATION_SPRAY | RESPIRATORY_TRACT | Status: DC

## 2016-03-14 NOTE — Assessment & Plan Note (Signed)
Improving but not at goal > Follow up per Primary Care planned

## 2016-03-14 NOTE — Progress Notes (Signed)
Subjective:     Patient ID: Terri Richardson, female   DOB: [birth year]     MRN: [id number]    Brief patient profile:  37 yowf never allergies never asthma as child or adult even while smoking but quit smoking 2015 then around fall 2016 indolent onset intermittent sob so referred to pulmonary clinic 01/25/2016 by Dr Boykin Peek with pfts that normalized 03/14/2016 on symbicort 160 2bid   History of Present Illness  01/25/2016 1st [facility] office visit/ Terri Richardson   Chief Complaint  Patient presents with  . Pulmonary Consult    Referred by Dr. Lendon Ka for eval of possible asthma. Pt c/o SOB, wheezing and cough for the past several months. She was given prednisone a few wks ago and this helped some. Her cough is prod with minimal yellow sputum.  She is using albuterol inhaler 2-3 x per day.   onset was insidious  initially around  June 2016  when would notice sporadic gradually worsening severe daytime coughing fits but then also developed noct awakening assoc sob better sitting up p taking saba which always helps some.   All symptoms transiently resolved  While on prednisone then worse back off it again and starting needing albuterol again up to 3 x daily and also hs since last round of pred ended around 2 weeks prior to OV  rec Plan A = Automatic = Symbicort 160 Take 2 puffs first thing in am and then another 2 puffs about 12 hours later.  Plan B = Backup Only use your albuterol (proair )as a rescue medication  Plan C = Crisis - only use your albuterol nebulizer if you first try Plan B and it fails to help > ok to use the nebulizer up to every 4 hours but if start needing it regularly call for immediate appointment bystolic 5 mg daily until seen     03/14/2016  f/u ov/Terri Richardson re: asthmatic  Bronchitis on symbicort 160 2bid Chief Complaint  Patient presents with  . Follow-up    Pt states her breathing is overall doing well. She states that she rarely has to use her rescue inhaler now- maybe not  even once per wk.    Not limited by breathing from desired activities  / sleeping well     No obvious day to day or daytime variability or assoc cough or   cp or chest tightness, subjective wheeze or overt sinus or hb symptoms. No unusual exp hx or h/o childhood pna/ asthma or knowledge of premature birth.   Also denies any obvious fluctuation of symptoms with weather or environmental changes or other aggravating or alleviating factors except as outlined above   Current Medications, Allergies, Complete Past Medical History, Past Surgical History, Family History, and Social History were reviewed in [employer] record.  ROS  The following are not active complaints unless bolded sore throat, dysphagia, dental problems, itching, sneezing,  nasal congestion or excess/ purulent secretions, ear ache,   fever, chills, sweats, unintended wt loss, classically pleuritic or exertional cp, hemoptysis,  orthopnea pnd or leg swelling, presyncope, palpitations, abdominal pain, anorexia, nausea, vomiting, diarrhea  or change in bowel or bladder habits, change in stools or urine, dysuria,hematuria,  rash, arthralgias, visual complaints, headache, numbness, weakness or ataxia or problems with walking or coordination,  change in mood/affect or memory.                Objective:   Physical Exam    amb wf nad  Wt Readings from Last 3 Encounters:  03/14/16 207 lb (93.895 kg)  02/10/16 200 lb 9.6 oz (90.992 kg)  02/03/16 201 lb 4.8 oz (91.309 kg)    Vital signs reviewed   HEENT: nl dentition, turbinates, and oropharynx. Nl external ear canals without cough reflex   NECK :  without JVD/Nodes/TM/ nl carotid upstrokes bilaterally   LUNGS: no acc muscle use,  Nl contour chest which is clear to A and P bilaterally without cough on insp or exp maneuvers   CV:  RRR  no s3 or murmur or increase in P2, no edema   ABD:  soft and nontender with nl inspiratory excursion in the supine  position. No bruits or organomegaly, bowel sounds nl  MS:  Nl gait/ ext warm without deformities, calf tenderness, cyanosis or clubbing No obvious joint restrictions   SKIN: warm and dry without lesions    NEURO:  alert, approp, nl sensorium with  no motor deficits     CXR PA and Lateral:   01/25/2016 :    I personally reviewed images and agree with radiology impression as follows:    The heart size and mediastinal contours are within normal limits. Mild right middle lobe scarring is stable. No evidence of pulmonary infiltrate or edema. No evidence of pneumothorax or pleural effusion. The visualized skeletal structures are unremarkable.           Assessment:

## 2016-03-14 NOTE — Patient Instructions (Signed)
Plan A = Automatic = Symbicort 80 Take 2 puffs first thing in am and then another 2 puffs about 12 hours later.    Plan B = Backup = goal is to need this no more than twice weekly Only use your albuterol as a rescue medication to be used if you can't catch your breath by resting or doing a relaxed purse lip breathing pattern.  - The less you use it, the better it will work when you need it. - Ok to use the inhaler up to 2 puffs  every 4 hours if you must but call for appointment if use goes up over your usual need - Don't leave home without it !!  (think of it like the spare tire for your car)       If you are satisfied with your treatment plan,  let your doctor know and he/she can either refill your medications or you can return here when your prescription runs out.     If in any way you are not 100% satisfied,  please tell us.  If 100% better, tell your friends!  Pulmonary follow up is as needed

## 2016-03-14 NOTE — Progress Notes (Signed)
PFT done today. 

## 2016-03-14 NOTE — Assessment & Plan Note (Signed)
Complicated by hbp and ERV 13% 03/14/2016 c/w body habitus  Body mass index is 37.85    No results found for: TSH   Contributing to gerd tendency/ doe/reviewed the need and the process to achieve and maintain neg calorie balance > defer f/u primary care including intermittently monitoring thyroid status

## 2016-03-14 NOTE — Assessment & Plan Note (Addendum)
Onset summer  2016 - NO  01/25/2016  =  106  - 01/25/2016    symbicort 160 2bid  - Allergy profile 01/25/16  Eos 1.2 with IgE 10 and neg RAST - PFT's  03/14/2016  FEV1 2.21 (79 % ) ratio 75  p 14 % improvement from saba p symbicort 160 x 2 x 6h  prior to study with DLCO  116/1113 corrects to 125 % for alv volume     - 03/14/2016  After extensive coaching HFA effectiveness =   90%  I had an extended final summary discussion with the patient reviewing all relevant studies completed to date and  lasting 15 to 20 minutes of a 25 minute visit on the following issues:    1) this is not copd but rather fully reversible AB  2) All goals of chronic asthma control met including optimal function and elimination of symptoms with minimal need for rescue therapy.  Contingencies discussed in full including contacting this office immediately if not controlling the symptoms using the rule of two's.     3) ok to step down to symbicort 80 2 bid as long as still has good control of airways   4) analogy between "dermatitis" and "bronchitis" reviewed with role of topical steroids for both using the lowest dose that works analogy  5) Each maintenance medication was reviewed in detail including most importantly the difference between maintenance and as needed and under what circumstances the prns are to be used.  Please see instructions for details which were reviewed in writing and the patient given a copy.    6 pulmonary f/u is prn

## 2016-03-16 ENCOUNTER — Ambulatory Visit (INDEPENDENT_AMBULATORY_CARE_PROVIDER_SITE_OTHER): Payer: Managed Care, Other (non HMO) | Admitting: Family Medicine

## 2016-03-16 ENCOUNTER — Encounter: Payer: Self-pay | Admitting: Family Medicine

## 2016-03-16 VITALS — BP 138/96 | HR 99 | Temp 98.8°F | Resp 18 | Wt 207.0 lb

## 2016-03-16 DIAGNOSIS — I10 Essential (primary) hypertension: Secondary | ICD-10-CM

## 2016-03-16 DIAGNOSIS — J4520 Mild intermittent asthma, uncomplicated: Secondary | ICD-10-CM

## 2016-03-16 MED ORDER — LOSARTAN POTASSIUM 100 MG PO TABS: 100.0000 mg | ORAL_TABLET | Freq: Every day | ORAL | Status: DC

## 2016-03-16 MED ORDER — AMLODIPINE BESYLATE 5 MG PO TABS: 5.0000 mg | ORAL_TABLET | Freq: Every day | ORAL | Status: DC

## 2016-03-16 NOTE — Progress Notes (Signed)
Pre visit review using our clinic review tool, if applicable. No additional management support is needed unless otherwise documented below in the visit note. 

## 2016-03-16 NOTE — Patient Instructions (Signed)
Please increase amlodipine to 5 mg daily and record blood pressure readings.  Minimal Blood Pressure Goal= AVERAGE < 140/90; Ideal is an AVERAGE < 135/85. This AVERAGE should be calculated from @ least 5-7 BP readings taken @ different times of day on different days of week. You should not respond to isolated BP readings , but rather the AVERAGE for that week .Please bring your blood pressure cuff to office visits to verify that it is reliable.It can also be checked against the blood pressure device at the pharmacy. Finger or wrist cuffs are not dependable; an a arm cuff is.   Please follow up for a BP evaluation in 2 weeks.  Continue DASH diet and exercise.

## 2016-03-16 NOTE — Progress Notes (Signed)
Subjective:    Patient ID: Terri Richardson, female    DOB: July 31, 1971, 45 y.o.   MRN: [id number]  HPI  Terri Richardson is a 45 year old female who presents today for evaluation of hypertension after a recent addition of amlodipine to her blood pressure regimen on 02/10/2016.   She is monitoring her blood pressure daily at home and notes her averages at home include 130s/90s. She did not bring her BP cuff or documented readings so averages are per patient report.  She is adherent to her medication regimen and denies any adverse effects. Today, she denies any visual disturbances, HA, SOB, chest pain, palpitations, numbness, tingling, or nosebleeds, or swelling of ankles. She reports adhering to the DASH diet and states that she has plans to exercise due to improvement in her asthmatic bronchitis and interest in losing weight. Currently, she does not have an exercise program in place.  Retake of BP is 138/96. A different cuff was used and blood pressure was taken on both arms. Suspect first reading related to a cuff that was too small for patient's arm demonstrating an elevated reading.  She completed pulmonary function tests and follow up with her pulmonary provider for asthmatic bronchitis on 03/14/2016. Currently taking symbicort 80 2 bid which is stepped down form 160 2 bid and reports no increased SOB or coughing noted. She denies recent use of rescue inhaler and understands if she is using this more than twice/week, she will follow up for further evaluation.     Review of Systems  Constitutional: Negative for fever, chills and fatigue.  HENT: Negative for congestion, postnasal drip, rhinorrhea, sinus pressure and sore throat.   Eyes: Negative for visual disturbance.  Respiratory: Negative for cough, shortness of breath and wheezing.   Cardiovascular: Negative for chest pain, palpitations and leg swelling.  Gastrointestinal: Negative for nausea, vomiting, abdominal pain, diarrhea and constipation.   Genitourinary: Negative for dysuria, urgency and hematuria.  Musculoskeletal: Negative for myalgias and arthralgias.  Skin: Negative for rash.  Neurological: Negative for dizziness, light-headedness and headaches.  Psychiatric/Behavioral:       Denies depressed or anxious mood   Past Medical History  Diagnosis Date  . Diabetes mellitus 2006     Social History   Social History  . Marital Status: Married    Spouse Name: N/A  . Number of Children: N/A  . Years of Education: N/A   Occupational History  . Works at [employer]    Social History Main Topics  . Smoking status: Former Smoker -- 0.50 packs/day for 4 years    Types: Cigarettes    Quit date: 02/20/2014  . Smokeless tobacco: Never Used  . Alcohol Use: No  . Drug Use: No  . Sexual Activity: Yes    Birth Control/ Protection: None   Other Topics Concern  . Not on file   Social History Narrative    Past Surgical History  Procedure Laterality Date  . Cesarean section  2008  . Tonsillectomy  6th grade  . Appendectomy  1st grade  . Cholecystectomy  11/11/2011    Procedure: LAPAROSCOPIC CHOLECYSTECTOMY WITH INTRAOPERATIVE CHOLANGIOGRAM;  Surgeon: Jetty Duhamel, MD;  Location: [facility] OR;  Service: General;  Laterality: N/A;    Family History  Problem Relation Age of Onset  . Hyperlipidemia Mother   . Hypertension Father   . Tongue cancer Maternal Grandfather   . Prostate cancer Paternal Uncle   . Kidney cancer Maternal Uncle   . Heart  disease Brother   . Hypertension Brother     No Known Allergies  Current Outpatient Prescriptions on File Prior to Visit  Medication Sig Dispense Refill  . albuterol (PROVENTIL HFA;VENTOLIN HFA) 108 (90 Base) MCG/ACT inhaler Inhale 2 puffs into the lungs every 6 (six) hours as needed for wheezing or shortness of breath. 1 Inhaler 0  . amLODipine (NORVASC) 2.5 MG tablet Take 1 tablet (2.5 mg total) by mouth daily. 30 tablet 1  . budesonide-formoterol (SYMBICORT) 80-4.5 MCG/ACT  inhaler Take 2 puffs first thing in am and then another 2 puffs about 12 hours later. 1 Inhaler 11  . losartan (COZAAR) 50 MG tablet Take 2 tablets (100 mg total) by mouth daily. 60 tablet 1   No current facility-administered medications on file prior to visit.    BP 138/96 mmHg  Pulse 99  Temp(Src) 98.8 F (37.1 C) (Oral)  Resp 18  Wt 207 lb (93.895 kg)  SpO2 96%       Objective:   Physical Exam  Constitutional: She is oriented to person, place, and time. She appears well-developed and well-nourished.  HENT:  Right Ear: Tympanic membrane normal.  Left Ear: Tympanic membrane normal.  Nose: No rhinorrhea.  Mouth/Throat: Mucous membranes are normal. No oropharyngeal exudate or posterior oropharyngeal erythema.  Eyes: Pupils are equal, round, and reactive to light. No scleral icterus.  Neck: Neck supple.  Cardiovascular: Normal rate, regular rhythm and normal heart sounds.   Pulmonary/Chest: Effort normal and breath sounds normal. She has no wheezes. She has no rales.  Abdominal: Soft. Bowel sounds are normal.  Lymphadenopathy:    She has no cervical adenopathy.  Neurological: She is alert and oriented to person, place, and time.  Skin: Skin is warm and dry. No rash noted.  Psychiatric: She has a normal mood and affect. Her behavior is normal. Judgment and thought content normal.       Assessment & Plan:  1. Essential hypertension Continue losartan as prescribed and increase amlodipine to 5 mg daily. Discussed adverse effects of medicine with the risks associated if pregnancy occurs.  She denies being sexually active and voices understanding of these risks. Provided patient with blood pressure monitoring parameters to report. Nurse visit for BP evaluation in 2 weeks.   - losartan (COZAAR) 100 MG tablet; Take 1 tablet (100 mg total) by mouth daily.  Dispense: 90 tablet; Refill: 1 - amLODipine (NORVASC) 5 MG tablet; Take 1 tablet (5 mg total) by mouth daily.  Dispense: 90  tablet; Refill: 1  2. Asthmatic bronchitis, mild intermittent, uncomplicated Continue symbicort 80 2  Bid as directed by pulmonology. Advised patient to follow up if symptoms of SOB worsen and she is using her rescue inhaler more than twice/weekly.  3. Morbid obesity, unspecified obesity type (HCC) Encouraged the DASH diet and discussed portion sizes with patient. Advised patient to increase exercise slowly and as tolerated.   Follow up in 2 weeks for BP reevaluation with nurse and 3 months for regularly scheduled follow up. If BP is elevated at the nurse visit, blood pressure will be addressed and sooner follow up will be required. Patient voiced understanding and agreed with plan.  Roddie McJulia Jahleah Mariscal, FNP-C

## 2016-03-30 ENCOUNTER — Ambulatory Visit (INDEPENDENT_AMBULATORY_CARE_PROVIDER_SITE_OTHER): Payer: Managed Care, Other (non HMO) | Admitting: *Deleted

## 2016-03-30 ENCOUNTER — Encounter: Payer: Self-pay | Admitting: Family Medicine

## 2016-03-30 ENCOUNTER — Encounter: Payer: Self-pay | Admitting: *Deleted

## 2016-03-30 ENCOUNTER — Ambulatory Visit (INDEPENDENT_AMBULATORY_CARE_PROVIDER_SITE_OTHER): Payer: Managed Care, Other (non HMO) | Admitting: Family Medicine

## 2016-03-30 ENCOUNTER — Telehealth: Payer: Self-pay | Admitting: Family Medicine

## 2016-03-30 VITALS — BP 128/96 | HR 84 | Ht 62.0 in | Wt 207.0 lb

## 2016-03-30 VITALS — BP 144/109 | HR 90

## 2016-03-30 DIAGNOSIS — IMO0001 Reserved for inherently not codable concepts without codable children: Secondary | ICD-10-CM

## 2016-03-30 DIAGNOSIS — I10 Essential (primary) hypertension: Secondary | ICD-10-CM | POA: Diagnosis not present

## 2016-03-30 DIAGNOSIS — J4520 Mild intermittent asthma, uncomplicated: Secondary | ICD-10-CM

## 2016-03-30 DIAGNOSIS — R030 Elevated blood-pressure reading, without diagnosis of hypertension: Secondary | ICD-10-CM

## 2016-03-30 MED ORDER — CLONIDINE HCL 0.1 MG PO TABS
0.1000 mg | ORAL_TABLET | Freq: Once | ORAL | Status: AC
  Administered 2016-03-30: 0.1 mg via ORAL

## 2016-03-30 NOTE — Progress Notes (Signed)
Patient came in today to followup on her blood pressure. Patient was seen on 03/16/2016 by Roddie McJulia Kordsmeier, FNP and during that visit Amil AmenJulia requested patient to followup with nurse. Patient presented during visit today readings of her blood pressure from her appointment on 03/16/2016 to yesterday (03/29/2016):  03/18/16: 144/105 03/19/16: 138/101 03/20/16: 143/97 03/21/16: 132/99 03/22/16: 143/104 03/23/16: 149/111 03/24/16: 158/102 03/25/16: 123/97 03/26/16: 135/104 03/27/16: 128/104 03/28/16: 132/96 03/29/16: 135/107   BP during nurse visit: 144/109

## 2016-03-30 NOTE — Progress Notes (Signed)
Pre visit review using our clinic review tool, if applicable. No additional management support is needed unless otherwise documented below in the visit note. 

## 2016-03-30 NOTE — Progress Notes (Signed)
Subjective:    Patient ID: Terri Richardson, female    DOB: 1970/11/01, 45 y.o.   MRN: [id number]  HPI  Terri Richardson is a 45 year old female who presents today for a nurse blood pressure follow up which resulted in a visit after assessing her BP. Rolly PancakeMichelle Jones, RN notes the following blood pressure at the beginning of the visit as 144/109. See data below from nurse visit.  Patient came in today to followup on her blood pressure. Patient was seen on 03/16/2016 by Roddie McJulia Monterio Bob, FNP and during that visit Amil AmenJulia requested patient to followup with nurse. Patient presented during visit today with home readings of her blood pressure from her appointment on 03/16/2016 to yesterday (03/29/2016):  03/18/16: 144/105 03/19/16: 138/101 03/20/16: 143/97 03/21/16: 132/99 03/22/16: 143/104 03/23/16: 149/111 03/24/16: 158/102 03/25/16: 123/97 03/26/16: 135/104 03/27/16: 128/104 03/28/16: 132/96 03/29/16: 135/107  These BPs are reported by patient and she did not bring her cuff in today to verify these readings. Her BP cuff has never been verified in this clinic and patient has been asked during every visit to bring her cuff with her for this purpose.  She denies visual disturbance, HA, SOB, chest pain, palpitations, numbness, tingling, or nosebleeds, or swelling of ankles.   On 03/16/16 amlodipine was increased to 5 mg daily which is her second agent with losartan 100mg  daily.   Today, she reports not taking her blood pressure medication today because she reports not eating and she usually takes this with food.   She is adhering to the DASH diet and walking 30 minutes daily without cardiopulmonary symptoms.   Clonidine 0.1 mg ordered due to reading of 144/109 in clinic and retake of blood pressure demonstrated a decrease with last blood pressure at visit noted as 128/96.  Asthmatic bronchitis: She is followed by pulmonology and denies any SOB, wheezing, or use of her rescue inhaler. She reports symbicort 80 2 bid has  provided excellent benefit. She denies any adverse reactions.  Review of Systems  Constitutional: Negative for fever, chills and fatigue.  Eyes: Negative for visual disturbance.  Respiratory: Negative for cough, shortness of breath and wheezing.   Cardiovascular: Negative for chest pain, palpitations and leg swelling.  Gastrointestinal: Negative for nausea, vomiting, abdominal pain and diarrhea.  Neurological: Negative for dizziness, light-headedness and headaches.   Past Medical History  Diagnosis Date  . Diabetes mellitus 2006     Social History   Social History  . Marital Status: Married    Spouse Name: N/A  . Number of Children: N/A  . Years of Education: N/A   Occupational History  . Works at [employer]    Social History Main Topics  . Smoking status: Former Smoker -- 0.50 packs/day for 4 years    Types: Cigarettes    Quit date: 02/20/2014  . Smokeless tobacco: Never Used  . Alcohol Use: No  . Drug Use: No  . Sexual Activity: Yes    Birth Control/ Protection: None   Other Topics Concern  . Not on file   Social History Narrative    Past Surgical History  Procedure Laterality Date  . Cesarean section  2008  . Tonsillectomy  6th grade  . Appendectomy  1st grade  . Cholecystectomy  11/11/2011    Procedure: LAPAROSCOPIC CHOLECYSTECTOMY WITH INTRAOPERATIVE CHOLANGIOGRAM;  Surgeon: Jetty DuhamelJames O Wyatt III, MD;  Location: [facility] OR;  Service: General;  Laterality: N/A;    Family History  Problem Relation Age of Onset  . Hyperlipidemia  Mother   . Hypertension Father   . Tongue cancer Maternal Grandfather   . Prostate cancer Paternal Uncle   . Kidney cancer Maternal Uncle   . Heart disease Brother   . Hypertension Brother     No Known Allergies  Current Outpatient Prescriptions on File Prior to Visit  Medication Sig Dispense Refill  . albuterol (PROVENTIL HFA;VENTOLIN HFA) 108 (90 Base) MCG/ACT inhaler Inhale 2 puffs into the lungs every 6 (six) hours as needed for  wheezing or shortness of breath. 1 Inhaler 0  . amLODipine (NORVASC) 5 MG tablet Take 1 tablet (5 mg total) by mouth daily. 90 tablet 1  . budesonide-formoterol (SYMBICORT) 80-4.5 MCG/ACT inhaler Take 2 puffs first thing in am and then another 2 puffs about 12 hours later. 1 Inhaler 11  . losartan (COZAAR) 100 MG tablet Take 1 tablet (100 mg total) by mouth daily. 90 tablet 1   No current facility-administered medications on file prior to visit.    BP 128/96 mmHg  Pulse 84  Ht  (1.575 m)  Wt 207 lb (93.895 kg)  BMI 37.85 kg/m2  LMP  (LMP Unknown)       Objective:   Physical Exam  Constitutional: She is oriented to person, place, and time. She appears well-developed and well-nourished.  Eyes: Pupils are equal, round, and reactive to light.  Neck: Neck supple.  Cardiovascular: Normal rate, regular rhythm and intact distal pulses.   No murmur heard. Pulmonary/Chest: Effort normal and breath sounds normal. She has no wheezes. She has no rales.  Abdominal: Soft. Bowel sounds are normal. There is no tenderness.  Lymphadenopathy:    She has no cervical adenopathy.  Neurological: She is alert and oriented to person, place, and time.  Skin: Skin is warm and dry.  Psychiatric: She has a normal mood and affect. Her behavior is normal. Judgment and thought content normal.       Assessment & Plan:  1. Essential hypertension Administered clonidine in office for elevated blood pressure. Follow up blood pressure after clonidine is noted as 128/96. Advised patient to take her medications when she returns home. Recommended that she monitor her blood pressure daily and bring these readings and her home cuff with her to verify accuracy in 3 days for follow up.  Discussed the importance of seeking medical attention if she experiences any chest pain, palpitations, SOB, numbness, tingling, weakness, headaches, or nosebleeds.   Highly encouraged adherence to the DASH diet and also continued  exercise program as long as she does not experience any cardiopulmonary symptoms.  - cloNIDine (CATAPRES) tablet 0.1 mg; Take 1 tablet (0.1 mg total) by mouth once.  Roddie Mc, FNP-C

## 2016-03-30 NOTE — Patient Instructions (Signed)
Minimal Blood Pressure Goal= AVERAGE < 140/90; Ideal is an AVERAGE < 135/85. This AVERAGE should be calculated from @ least 5-7 BP readings taken @ different times of day on different days of week. You should not respond to isolated BP readings , but rather the AVERAGE for that week .Please bring your blood pressure cuff to office visits to verify that it is reliable.It can also be checked against the blood pressure device at the pharmacy. Finger or wrist cuffs are not dependable; an arm cuff is.  Take blood pressure medications prior to blood pressure follow up in 3 days. Continue monitoring BP and low salt diet with exercise. If you have any chest pain, palpitations, shortness or breath, headaches, nosebleeds, numbness, or tingling, please seek medical attention immediately.

## 2016-03-30 NOTE — Progress Notes (Signed)
Raynelle FanningJulie was made aware of BP during nurse visit. Patient will be placed on Julie's schedule to assess and provide intervention.

## 2016-03-31 NOTE — Telephone Encounter (Signed)
Opened in error

## 2016-04-03 ENCOUNTER — Ambulatory Visit: Payer: Managed Care, Other (non HMO) | Admitting: *Deleted

## 2016-04-03 VITALS — BP 124/86

## 2016-04-03 DIAGNOSIS — I10 Essential (primary) hypertension: Secondary | ICD-10-CM

## 2016-04-03 NOTE — Progress Notes (Signed)
During BP check, patient provide journal of BP's over the weekend:  03/30/16: 127/99 03/31/16: 124/86 04/01/16: 140/103 04/02/16: 126/96

## 2016-04-26 ENCOUNTER — Emergency Department (HOSPITAL_COMMUNITY)
Admission: EM | Admit: 2016-04-26 | Discharge: 2016-04-26 | Disposition: A | Discharge status: Home / Self Care | Payer: No Typology Code available for payment source | Attending: Emergency Medicine | Admitting: Emergency Medicine

## 2016-04-26 ENCOUNTER — Encounter (HOSPITAL_COMMUNITY): Payer: Self-pay

## 2016-04-26 ENCOUNTER — Emergency Department (HOSPITAL_COMMUNITY): Payer: No Typology Code available for payment source

## 2016-04-26 DIAGNOSIS — Y939 Activity, unspecified: Secondary | ICD-10-CM | POA: Insufficient documentation

## 2016-04-26 DIAGNOSIS — Z79899 Other long term (current) drug therapy: Secondary | ICD-10-CM | POA: Insufficient documentation

## 2016-04-26 DIAGNOSIS — E119 Type 2 diabetes mellitus without complications: Secondary | ICD-10-CM | POA: Diagnosis not present

## 2016-04-26 DIAGNOSIS — I10 Essential (primary) hypertension: Secondary | ICD-10-CM | POA: Diagnosis not present

## 2016-04-26 DIAGNOSIS — Y999 Unspecified external cause status: Secondary | ICD-10-CM | POA: Diagnosis not present

## 2016-04-26 DIAGNOSIS — S39012A Strain of muscle, fascia and tendon of lower back, initial encounter: Secondary | ICD-10-CM | POA: Insufficient documentation

## 2016-04-26 DIAGNOSIS — Y92410 Unspecified street and highway as the place of occurrence of the external cause: Secondary | ICD-10-CM | POA: Insufficient documentation

## 2016-04-26 DIAGNOSIS — Z87891 Personal history of nicotine dependence: Secondary | ICD-10-CM | POA: Diagnosis not present

## 2016-04-26 DIAGNOSIS — S3992XA Unspecified injury of lower back, initial encounter: Secondary | ICD-10-CM | POA: Diagnosis present
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
## 2016-04-26 MED ORDER — IBUPROFEN 800 MG PO TABS
800.0000 mg | ORAL_TABLET | Freq: Once | ORAL | Status: AC
  Administered 2016-04-26: 800 mg via ORAL
  Filled 2016-04-26: qty 1

## 2016-04-26 MED ORDER — IBUPROFEN 600 MG PO TABS: 600.0000 mg | ORAL_TABLET | Freq: Four times a day (QID) | ORAL | Status: DC | PRN
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
## 2016-04-26 NOTE — Discharge Instructions (Signed)
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
## 2016-06-21 ENCOUNTER — Telehealth: Payer: Self-pay | Admitting: Family Medicine

## 2016-06-21 NOTE — Telephone Encounter (Signed)
Pt needs new rx albuterol haler #[facility] [address]. Pt ins will not longer pay if she uses [facility]

## 2016-06-23 NOTE — Telephone Encounter (Signed)
Called and left Voicemail for pt to return call to office regarding albuterol inhaler. Per Julia's note if pt is using inhaler more than twice weekly a follow-up appointment is recommended. Just calling to verify inhaler usage.

## 2016-06-23 NOTE — Telephone Encounter (Signed)
Pt returning your call. Please call back °

## 2016-06-23 NOTE — Telephone Encounter (Signed)
Spoke with pt. Pt verified follow up appointment 07/06/16 with Terri Richardson. 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 Okay to refill?

## 2016-06-27 NOTE — Telephone Encounter (Signed)
Pt has appointment 07/06/16

## 2016-06-27 NOTE — Telephone Encounter (Signed)
She is currently followed by pulmonology for this issue. She needs an office visit with me for evaluation of her BP and an appointment with Dr. Sherene SiresWert if she is using her rescue inhaler more than twice weekly as stated in his last visit with her in May. Please verify that she has follow up appointments and if so, albuterol can be provided with the note that no refills will be provided without an appointment.

## 2016-06-27 NOTE — Telephone Encounter (Signed)
Please refill as documented below.

## 2016-06-28 NOTE — Telephone Encounter (Signed)
Sierra please give this pt a refill on albuterol

## 2016-06-29 ENCOUNTER — Other Ambulatory Visit: Payer: Self-pay | Admitting: Emergency Medicine

## 2016-06-29 ENCOUNTER — Telehealth: Payer: Self-pay | Admitting: Family Medicine

## 2016-06-29 DIAGNOSIS — R062 Wheezing: Secondary | ICD-10-CM

## 2016-06-29 MED ORDER — ALBUTEROL SULFATE HFA 108 (90 BASE) MCG/ACT IN AERS: 2.0000 | INHALATION_SPRAY | Freq: Four times a day (QID) | RESPIRATORY_TRACT | 0 refills | Status: DC | PRN

## 2016-06-29 NOTE — Telephone Encounter (Signed)
Spoke with patient.

## 2016-06-29 NOTE — Telephone Encounter (Signed)
Pt returned your call and would like a call back .. °

## 2016-06-29 NOTE — Telephone Encounter (Signed)
Called and spoke with pt. Informing her of julia's remarks. Pt has an follow up appointment 07/06/16. I informed pt that refill has been sent to pharmacy and further refills will require a follow up with Dr. Sherene SiresWert per Amil AmenJulia. Pt verbalized understanding.

## 2016-07-06 ENCOUNTER — Ambulatory Visit (INDEPENDENT_AMBULATORY_CARE_PROVIDER_SITE_OTHER): Payer: Managed Care, Other (non HMO) | Admitting: Family Medicine

## 2016-07-06 ENCOUNTER — Encounter: Payer: Self-pay | Admitting: Family Medicine

## 2016-07-06 VITALS — BP 128/90 | HR 99 | Temp 98.0°F | Wt 208.2 lb

## 2016-07-06 DIAGNOSIS — R062 Wheezing: Secondary | ICD-10-CM

## 2016-07-06 DIAGNOSIS — J4541 Moderate persistent asthma with (acute) exacerbation: Secondary | ICD-10-CM

## 2016-07-06 DIAGNOSIS — R05 Cough: Secondary | ICD-10-CM | POA: Diagnosis not present

## 2016-07-06 DIAGNOSIS — K219 Gastro-esophageal reflux disease without esophagitis: Secondary | ICD-10-CM | POA: Diagnosis not present

## 2016-07-06 DIAGNOSIS — R059 Cough, unspecified: Secondary | ICD-10-CM

## 2016-07-06 MED ORDER — OMEPRAZOLE 20 MG PO CPDR
20.0000 mg | DELAYED_RELEASE_CAPSULE | Freq: Every day | ORAL | 3 refills | Status: DC
Start: 2016-07-06 — End: 2016-08-02

## 2016-07-06 MED ORDER — IPRATROPIUM-ALBUTEROL 0.5-2.5 (3) MG/3ML IN SOLN
3.0000 mL | Freq: Once | RESPIRATORY_TRACT | Status: AC
  Administered 2016-07-06: 3 mL via RESPIRATORY_TRACT

## 2016-07-06 MED ORDER — PREDNISONE 10 MG PO TABS: ORAL_TABLET | ORAL | 0 refills | Status: DC

## 2016-07-06 MED ORDER — AZITHROMYCIN 250 MG PO TABS
ORAL_TABLET | ORAL | 0 refills | Status: DC
Start: 2016-07-06 — End: 2016-09-07

## 2016-07-06 NOTE — Progress Notes (Signed)
Subjective:    Patient ID: Terri Richardson, female    DOB: [birth year], 45 y.o.   MRN: [id number]  HPI  Terri Richardson is a 45 year old female who presents today for chronic management follow up and for new onset of a worsening productive cough and rhinitis that has been present for >2 weeks. Today, she reports a productive cough that is light yellow to a brownish yellow that has been present for greater than 2 weeks.  Associated symptom of rhinitis and reflux are present.  Denies fever, chills, tooth pain, ear pain, and sinus pressure.  Treatment at home includes Alka Seltzer daily for this symptom. She is out of allergra at this time but will resume this today as her symptom of rhinitis is worse.   HTN: BP is controlled today. She reports monitoring her BP at home and states that her systolic ranges are in the 120s and diastolic ranges in the 80s and not exceeding 90 at home.   She denies visual disturbance, HA, SOB, chest pain, palpitations, numbness, tingling, nosebleeds, or swelling of ankles. She is currently on amlodipine 5 mg and losartan 100 mg daily. She reports that she has not been able to exercise due to her recent coughing and SOB.  Asthmatic bronchitis history and intermittent reactive airway: She is followed by pulmonology and reports a recent increase of using her inhaler 3x/week.  She reports  an increase in her albuterol use when working at her job with up to 3x/day previously. She is currently using Symbicort 160 2 puffs BID however she states that she has been coughing in the morning and is unsure if she inhaled all of the medication. The plan that pulmonology has established is Symbicort 160 2 puffs in the am and 2 puffs about 12 hours later. Backup of albuterol as a rescue inhaler and use of a nebulizer albuterol if rescue inhaler is not helpful. She reports that she does not have a nebulizer and has not made a follow up appointment with pulmonology at this time. She states she will  make a follow up appointment today.   Review of Systems  Constitutional: Negative for fatigue.  HENT: Positive for congestion, postnasal drip and rhinorrhea. Negative for sinus pressure and sore throat.   Eyes: Negative for visual disturbance.       Watery eyes  Respiratory: Negative for cough.   Cardiovascular: Negative for chest pain, palpitations and leg swelling.  Gastrointestinal: Negative for abdominal pain, blood in stool, diarrhea, nausea and vomiting.  Musculoskeletal: Negative for back pain and myalgias.  Skin: Negative for rash.  Neurological: Negative for dizziness, light-headedness and headaches.  Psychiatric/Behavioral:       Denies depressed or anxious mood   Past Medical History:  Diagnosis Date  . Diabetes mellitus 2006     Social History   Social History  . Marital status: Married    Spouse name: N/A  . Number of children: N/A  . Years of education: N/A   Occupational History  . Works at [employer]    Social History Main Topics  . Smoking status: Former Smoker    Packs/day: 0.50    Years: 4.00    Types: Cigarettes    Quit date: 02/20/2014  . Smokeless tobacco: Never Used  . Alcohol use No  . Drug use: No  . Sexual activity: Yes    Birth control/ protection: None   Other Topics Concern  . Not on file   Social History Narrative  .  No narrative on file    Past Surgical History:  Procedure Laterality Date  . APPENDECTOMY  1st grade  . CESAREAN SECTION  2008  . CHOLECYSTECTOMY  11/11/2011   Procedure: LAPAROSCOPIC CHOLECYSTECTOMY WITH INTRAOPERATIVE CHOLANGIOGRAM;  Surgeon: Jetty Duhamel, MD;  Location: [facility] OR;  Service: General;  Laterality: N/A;  . TONSILLECTOMY  6th grade    Family History  Problem Relation Age of Onset  . Hyperlipidemia Mother   . Hypertension Father   . Tongue cancer Maternal Grandfather   . Prostate cancer Paternal Uncle   . Kidney cancer Maternal Uncle   . Heart disease Brother   . Hypertension Brother     No  Known Allergies  Current Outpatient Prescriptions on File Prior to Visit  Medication Sig Dispense Refill  . albuterol (PROVENTIL HFA;VENTOLIN HFA) 108 (90 Base) MCG/ACT inhaler Inhale 2 puffs into the lungs every 6 (six) hours as needed for wheezing or shortness of breath. 1 Inhaler 0  . amLODipine (NORVASC) 5 MG tablet Take 1 tablet (5 mg total) by mouth daily. 90 tablet 1  . ibuprofen (ADVIL,MOTRIN) 600 MG tablet Take 1 tablet (600 mg total) by mouth every 6 (six) hours as needed. 30 tablet 0  . losartan (COZAAR) 100 MG tablet Take 1 tablet (100 mg total) by mouth daily. 90 tablet 1  . SYMBICORT 160-4.5 MCG/ACT inhaler Inhale 2 puffs into the lungs 2 (two) times daily.  11  . budesonide-formoterol (SYMBICORT) 80-4.5 MCG/ACT inhaler Take 2 puffs first thing in am and then another 2 puffs about 12 hours later. (Patient not taking: Reported on 07/06/2016) 1 Inhaler 11   No current facility-administered medications on file prior to visit.     BP 128/90 (BP Location: Right Arm, Patient Position: Sitting, Cuff Size: Normal)   Pulse 99   Temp 98 F (36.7 C)   Wt 208 lb 3.2 oz (94.4 kg)   LMP 07/04/2016   SpO2 95%   BMI 38.08 kg/m       Objective:   Physical Exam  Constitutional: She is oriented to person, place, and time. She appears well-developed and well-nourished.  HENT:  Right Ear: Tympanic membrane normal.  Left Ear: Tympanic membrane normal.  Nose: Rhinorrhea present. Right sinus exhibits no maxillary sinus tenderness and no frontal sinus tenderness. Left sinus exhibits no maxillary sinus tenderness and no frontal sinus tenderness.  Mouth/Throat: Mucous membranes are normal. No oropharyngeal exudate or posterior oropharyngeal erythema.  Eyes: Pupils are equal, round, and reactive to light. No scleral icterus.  Neck: Neck supple.  Cardiovascular: Normal rate and regular rhythm.   Pulmonary/Chest: Effort normal. She has wheezes. She has no rales.  Abdominal: Soft. Bowel sounds  are normal. She exhibits no distension. There is no tenderness.  Musculoskeletal: She exhibits no edema.  Lymphadenopathy:    She has no cervical adenopathy.  Neurological: She is alert and oriented to person, place, and time.  Skin: Skin is warm and dry. No rash noted.  Psychiatric: She has a normal mood and affect. Her behavior is normal. Judgment and thought content normal.       Assessment & Plan:  1. Asthmatic bronchitis, moderate persistent, with acute exacerbation Wheezing auscultated throughout lung fields. O2 saturation 95%. Recent symptoms of worsening cough with rhinitis suggests that symptoms may be viral or bacterial in nature. We discussed that symptoms are most likely viral in origin however history of increasing cough that is productive in nature with history of reactive airway disease and length  of symptoms support a course of azithyromycin will be provided at this time. Short tapering dose of prednisone provided.  Reviewed proper use of inhalers and advised her to contact pulmonology for a follow up appointment as her maintenance therapy may not be adequate for her at this time. Deferred chest X-ray as she recently completed 2 and there were no changes present. Will consider chest X-ray if symptoms do not resolve. - predniSONE (DELTASONE) 10 MG tablet; Take 4 tablets once daily for 2 days, 3 tablets once daily for 2 days, 2 tablets once daily for 2 days, and 1 tablet once daily for 2 days  Dispense: 20 tablet; Refill: 0 - azithromycin (ZITHROMAX) 250 MG tablet; Take 2 tablets today then take one tablet daily for 4 days  Dispense: 6 tablet; Refill: 0  2. Wheezing Nebulizer completed in office today. Scattered wheezing persists. O2 saturation 95% RR:  18 Recommended follow up with pulmonology and reviewed plan established by pulmonology with her. She will continue Symbicort 160 2 puffs BID and will initiate prednisone taper and antibiotic therapy today. - ipratropium-albuterol  (DUONEB) 0.5-2.5 (3) MG/3ML nebulizer solution 3 mL; Take 3 mLs by nebulization once.  3. Gastroesophageal reflux disease, esophagitis presence not specified Trial of omeprazole for reflux treatment to address cough and symptoms of GERD. - omeprazole (PRILOSEC) 20 MG capsule; Take 1 capsule (20 mg total) by mouth daily.  Dispense: 30 capsule; Refill: 3  4. Cough  Contact pulmonology today for follow up appointment. Follow up 2 months or sooner if symptoms have not improved, worsen, or new symptoms develop. Advised her to seek medical attention immediately for increasing SOB, cough, and also if her plan with pulmonology is not relieving her symptoms. She voiced understanding and agreed with plan.  Roddie Mc, FNP-C

## 2016-07-06 NOTE — Progress Notes (Signed)
Pre visit review using our clinic review tool, if applicable. No additional management support is needed unless otherwise documented below in the visit note. 

## 2016-07-06 NOTE — Patient Instructions (Signed)
Please take medication as directed and follow up with pulmonology to assess current treatment regimen for symptoms. If symptoms do not improve, worsen, or you develop a fever >100, please follow up for further evaluation and treatment.  Please follow plan as directed by pulmonology and continue current medication regimen.  Follow up in 2 months or sooner for chronic management of blood pressure and evaluation of reflux.   Gastroesophageal Reflux Disease, Adult Normally, food travels down the esophagus and stays in the stomach to be digested. However, when a person has gastroesophageal reflux disease (GERD), food and stomach acid move back up into the esophagus. When this happens, the esophagus becomes sore and inflamed. Over time, GERD can create small holes (ulcers) in the lining of the esophagus.  CAUSES This condition is caused by a problem with the muscle between the esophagus and the stomach (lower esophageal sphincter, or LES). Normally, the LES muscle closes after food passes through the esophagus to the stomach. When the LES is weakened or abnormal, it does not close properly, and that allows food and stomach acid to go back up into the esophagus. The LES can be weakened by certain dietary substances, medicines, and medical conditions, including:  Tobacco use.  Pregnancy.  Having a hiatal hernia.  Heavy alcohol use.  Certain foods and beverages, such as coffee, chocolate, onions, and peppermint. RISK FACTORS This condition is more likely to develop in:  People who have an increased body weight.  People who have connective tissue disorders.  People who use NSAID medicines. SYMPTOMS Symptoms of this condition include:  Heartburn.  Difficult or painful swallowing.  The feeling of having a lump in the throat.  Abitter taste in the mouth.  Bad breath.  Having a large amount of saliva.  Having an upset or bloated stomach.  Belching.  Chest pain.  Shortness of  breath or wheezing.  Ongoing (chronic) cough or a night-time cough.  Wearing away of tooth enamel.  Weight loss. Different conditions can cause chest pain. Make sure to see your health care provider if you experience chest pain. DIAGNOSIS Your health care provider will take a medical history and perform a physical exam. To determine if you have mild or severe GERD, your health care provider may also monitor how you respond to treatment. You may also have other tests, including:  An endoscopy toexamine your stomach and esophagus with a small camera.  A test thatmeasures the acidity level in your esophagus.  A test thatmeasures how much pressure is on your esophagus.  A barium swallow or modified barium swallow to show the shape, size, and functioning of your esophagus. TREATMENT The goal of treatment is to help relieve your symptoms and to prevent complications. Treatment for this condition may vary depending on how severe your symptoms are. Your health care provider may recommend:  Changes to your diet.  Medicine.  Surgery. HOME CARE INSTRUCTIONS Diet  Follow a diet as recommended by your health care provider. This may involve avoiding foods and drinks such as:  Coffee and tea (with or without caffeine).  Drinks that containalcohol.  Energy drinks and sports drinks.  Carbonated drinks or sodas.  Chocolate and cocoa.  Peppermint and mint flavorings.  Garlic and onions.  Horseradish.  Spicy and acidic foods, including peppers, chili powder, curry powder, vinegar, hot sauces, and barbecue sauce.  Citrus fruit juices and citrus fruits, such as oranges, lemons, and limes.  Tomato-based foods, such as red sauce, chili, salsa, and pizza with red  sauce.  [place] and fatty foods, such as donuts, french fries, potato chips, and high-fat dressings.  High-fat meats, such as hot dogs and fatty cuts of red and white meats, such as rib eye steak, sausage, ham, and  bacon.  High-fat dairy items, such as whole milk, butter, and cream cheese.  Eat small, frequent meals instead of large meals.  Avoid drinking large amounts of liquid with your meals.  Avoid eating meals during the 2-3 hours before bedtime.  Avoid lying down right after you eat.  Do not exercise right after you eat. General Instructions  Pay attention to any changes in your symptoms.  Take over-the-counter and prescription medicines only as told by your health care provider. Do not take aspirin, ibuprofen, or other NSAIDs unless your health care provider told you to do so.  Do not use any tobacco products, including cigarettes, chewing tobacco, and e-cigarettes. If you need help quitting, ask your health care provider.  Wear loose-fitting clothing. Do not wear anything tight around your waist that causes pressure on your abdomen.  Raise (elevate) the head of your bed 6 inches (15cm).  Try to reduce your stress, such as with yoga or meditation. If you need help reducing stress, ask your health care provider.  If you are overweight, reduce your weight to an amount that is healthy for you. Ask your health care provider for guidance about a safe weight loss goal.  Keep all follow-up visits as told by your health care provider. This is important. SEEK MEDICAL CARE IF:  You have new symptoms.  You have unexplained weight loss.  You have difficulty swallowing, or it hurts to swallow.  You have wheezing or a persistent cough.  Your symptoms do not improve with treatment.  You have a hoarse voice. SEEK IMMEDIATE MEDICAL CARE IF:  You have pain in your arms, neck, jaw, teeth, or back.  You feel sweaty, dizzy, or light-headed.  You have chest pain or shortness of breath.  You vomit and your vomit looks like blood or coffee grounds.  You faint.  Your stool is bloody or black.  You cannot swallow, drink, or eat.   This information is not intended to replace advice  given to you by your health care provider. Make sure you discuss any questions you have with your health care provider.   Document Released: 07/19/2005 Document Revised: 06/30/2015 Document Reviewed: 02/03/2015 Elsevier Interactive Patient Education [employer]

## 2016-07-07 ENCOUNTER — Ambulatory Visit (INDEPENDENT_AMBULATORY_CARE_PROVIDER_SITE_OTHER): Payer: Managed Care, Other (non HMO) | Admitting: Internal Medicine

## 2016-07-07 ENCOUNTER — Encounter: Payer: Self-pay | Admitting: Internal Medicine

## 2016-07-07 VITALS — BP 126/88 | HR 92 | Temp 98.6°F | Ht 62.0 in | Wt 209.0 lb

## 2016-07-07 DIAGNOSIS — J45909 Unspecified asthma, uncomplicated: Secondary | ICD-10-CM

## 2016-07-07 DIAGNOSIS — J449 Chronic obstructive pulmonary disease, unspecified: Secondary | ICD-10-CM

## 2016-07-07 MED ORDER — AMOXICILLIN-POT CLAVULANATE 875-125 MG PO TABS: 1.0000 | ORAL_TABLET | Freq: Two times a day (BID) | ORAL | 0 refills | Status: AC

## 2016-07-07 NOTE — Patient Instructions (Addendum)
If mucus doesn't turn clear > Augmentin 875 mg take one pill twice daily  X 10 days - take at breakfast and supper with large glass of water.  It would help reduce the usual side effects (diarrhea and yeast infections) if you ate cultured yogurt at lunch.   Only use your albuterol as a rescue medication to be used if you can't catch your breath by resting or doing a relaxed purse lip breathing pattern.  - The less you use it, the better it will work when you need it. - Ok to use up to 2 puffs  every 4 hours if you must but call for immediate appointment if use goes up over your usual need - Don't leave home without it !!  (think of it like the spare tire for your car)    Try prilosec (omeprazole)  20mg   Take 30-60 min before first meal of the day and Pepcid ac (famotidine) 20 mg one @  bedtime until cough is completely gone for at least a week without the need for cough suppression  GERD (REFLUX)  is an extremely common cause of respiratory symptoms just like yours , many times with no obvious heartburn at all.    It can be treated with medication, but also with lifestyle changes including elevation of the head of your bed (ideally with 6 inch  bed blocks),  Smoking cessation, avoidance of late meals, excessive alcohol, and avoid fatty foods, chocolate, peppermint, colas, red wine, and acidic juices such as orange juice.  NO MINT OR MENTHOL PRODUCTS SO NO COUGH DROPS   USE SUGARLESS CANDY INSTEAD (Jolley ranchers or Stover's or Life Savers) or even ice chips will also do - the key is to swallow to prevent all throat clearing. NO OIL BASED VITAMINS - use powdered substitutes.  Please schedule a follow up office visit in 4 weeks, sooner if needed  - needs repeat feno on return

## 2016-07-07 NOTE — Progress Notes (Signed)
Subjective:     Patient ID: Terri Richardson, female   DOB: Feb 28, 1971     MRN: [id number]    Brief patient profile:  [id number] yowf never allergies never asthma as child or adult even while smoking but quit smoking 2015 then around fall 2016 indolent onset intermittent sob so referred to pulmonary clinic 01/25/2016 by Dr Boykin PeekKordsmeir with pfts that normalized 03/14/2016 on symbicort 160 2bid   History of Present Illness  01/25/2016 1st [facility] office visit/ Khary Schaben   Chief Complaint  Patient presents with  . Pulmonary Consult    Referred by Dr. Lendon KaKordsmeier for eval of possible asthma. Pt c/o SOB, wheezing and cough for the past several months. She was given prednisone a few wks ago and this helped some. Her cough is prod with minimal yellow sputum.  She is using albuterol inhaler 2-3 x per day.   onset was insidious  initially around  June 2016  when would notice sporadic gradually worsening severe daytime coughing fits but then also developed noct awakening assoc sob better sitting up p taking saba which always helps some.   All symptoms transiently resolved  While on prednisone then worse back off it again and starting needing albuterol again up to 3 x daily and also hs since last round of pred ended around 2 weeks prior to OV  rec Plan A = Automatic = Symbicort 160 Take 2 puffs first thing in am and then another 2 puffs about 12 hours later.  Plan B = Backup Only use your albuterol (proair )as a rescue medication  Plan C = Crisis - only use your albuterol nebulizer if you first try Plan B and it fails to help > ok to use the nebulizer up to every 4 hours but if start needing it regularly call for immediate appointment bystolic 5 mg daily until seen     03/14/2016  f/u ov/Twanisha Foulk re: asthmatic  Bronchitis on symbicort 160 2bid Chief Complaint  Patient presents with  . Follow-up    Pt states her breathing is overall doing well. She states that she rarely has to use her rescue inhaler now- maybe not  even once per wk.   No sob/ no cough not much need for saba rec Try symb 80 2bid and f/u prn  > worse p symb 80 x 3 days so resumed the 160 2bid    07/07/2016 acute extended ov/Maty Zeisler re: poor asthma control x months on symb 160 2bid/ not on maint gerd rx  Chief Complaint  Patient presents with  . Acute Visit    Pt c/o increased SOB, wheezing and cough for the past month. Her cough is prod with large amounts of yellow to brown sputum.  She is using albuterol inhaler 3-4 x per wk.   started using rescue inhaler beginning summer of 2017 assoc with variably purulent sputum production and mild nasal congestion. [facility]   No obvious patterns in day to day or daytime variability or assoc  Production of  mucus plugs,   cp or chest tightness, subjective wheeze or overt  hb symptoms. No unusual exp hx or h/o childhood pna/ asthma or knowledge of premature birth.   Also denies any obvious fluctuation of symptoms with weather or environmental changes or other aggravating or alleviating factors except as outlined above   Current Medications, Allergies, Complete Past Medical History, Past Surgical History, Family History, and Social History were reviewed in [facility] electronic medical record.  ROS  The following are not active complaints unless bolded sore throat, dysphagia, dental problems, itching, sneezing,  nasal congestion or excess/ purulent secretions, ear ache,   fever, chills, sweats, unintended wt loss, classically pleuritic or exertional cp, hemoptysis,  orthopnea pnd or leg swelling, presyncope, palpitations, abdominal pain, anorexia, nausea, vomiting, diarrhea  or change in bowel or bladder habits, change in stools or urine, dysuria,hematuria,  rash, arthralgias, visual complaints, headache, numbness, weakness or ataxia or problems with walking or coordination,  change in mood/affect or memory.             Objective:   Physical Exam    amb wf nad    07/07/2016       209   03/14/16 207 lb (93.895 kg)  02/10/16 200 lb 9.6 oz (90.992 kg)  02/03/16 201 lb 4.8 oz (91.309 kg)    Vital signs reviewed   HEENT: nl dentition, turbinates, and oropharynx. Nl external ear canals without cough reflex   NECK :  without JVD/Nodes/TM/ nl carotid upstrokes bilaterally   LUNGS: no acc muscle use,  Nl contour chest with insp and exp rhonchi bilaterally    CV:  RRR  no s3 or murmur or increase in P2, no edema   ABD:  soft and nontender with nl inspiratory excursion in the supine position. No bruits or organomegaly, bowel sounds nl  MS:  Nl gait/ ext warm without deformities, calf tenderness, cyanosis or clubbing No obvious joint restrictions   SKIN: warm and dry without lesions    NEURO:  alert, approp, nl sensorium with  no motor deficits       I personally reviewed images and agree with radiology impression as follows:  CXR:   04/26/16 The cardiomediastinal contours are normal. Borderline hyperinflation. Right middle lobe scarring is unchanged. Pulmonary vasculature is normal. No consolidation, pleural effusion, or pneumothorax. No acute osseous abnormalities are seen.           Assessment:

## 2016-07-08 ENCOUNTER — Encounter: Payer: Self-pay | Admitting: Internal Medicine

## 2016-07-08 NOTE — Assessment & Plan Note (Signed)
Onset summer  2016 - NO  01/25/2016  =  106  - 01/25/2016    symbicort 160 2bid  - Allergy profile 01/25/16  Eos 1.2 with IgE 10 and neg RAST - PFT's  03/14/2016  FEV1 2.21 (79 % ) ratio 75  p 14 % improvement from saba p symbicort 160 x 2 x 6h  prior to study with DLCO  116/1113 corrects to 125 % for alv volume   - 07/07/2016  After extensive coaching HFA effectiveness =   90%   DDX of  difficult airways management almost all start with A and  include Adherence, Ace Inhibitors, Acid Reflux, Active Sinus Disease, Alpha 1 Antitripsin deficiency, Anxiety masquerading as Airways dz,  ABPA,  Allergy(esp in young), Aspiration (esp in elderly), Adverse effects of meds,  Active smokers, A bunch of PE's (a small clot burden can't cause this syndrome unless there is already severe underlying pulm or vascular dz with poor reserve) plus two Bs  = Bronchiectasis and Beta blocker use..and one C= CHF   Adherence is always the initial "prime suspect" and is a multilayered concern that requires a "trust but verify" approach in every patient - starting with knowing how to use medications, especially inhalers, correctly, keeping up with refills and understanding the fundamental difference between maintenance and prns vs those medications only taken for a very short course and then stopped and not refilled.  - reminded that when symbicort is on red, it's dead concept and needs to pay mor attention to this detail/ refills more timely - - The proper method of use, as well as anticipated side effects, of a metered-dose inhaler are discussed and demonstrated to the patient. Improved effectiveness after extensive coaching during this visit to a level of approximately 90 % from a baseline of 75 %  > continue symbicort 160 2bid  ? Acid (or non-acid) GERD > always difficult to exclude as up to 75% of pts in some series report no assoc GI/ Heartburn symptoms> rec max (24h)  acid suppression and diet restrictions/ reviewed and  instructions given in writing.   ? Active sinus dz > if still discolored mucus when completes zpak rec augmentin x 10 days then sinus ct prn    I had an extended discussion with the patient reviewing all relevant studies completed to date and  lasting 15 to 20 minutes of a 25 minute visit    Each maintenance medication was reviewed in detail including most importantly the difference between maintenance and prns and under what circumstances the prns are to be triggered using an action plan format that is not reflected in the computer generated alphabetically organized AVS.    Please see instructions for details which were reviewed in writing and the patient given a copy highlighting the part that I personally wrote and discussed at today's ov.

## 2016-08-02 ENCOUNTER — Other Ambulatory Visit: Payer: Self-pay | Admitting: Emergency Medicine

## 2016-08-02 DIAGNOSIS — K219 Gastro-esophageal reflux disease without esophagitis: Secondary | ICD-10-CM

## 2016-08-02 MED ORDER — OMEPRAZOLE 20 MG PO CPDR: 20.0000 mg | DELAYED_RELEASE_CAPSULE | Freq: Every day | ORAL | 0 refills | Status: DC

## 2016-08-04 ENCOUNTER — Ambulatory Visit: Payer: Managed Care, Other (non HMO) | Admitting: Internal Medicine

## 2016-08-23 ENCOUNTER — Other Ambulatory Visit: Payer: Self-pay

## 2016-08-23 DIAGNOSIS — K219 Gastro-esophageal reflux disease without esophagitis: Secondary | ICD-10-CM

## 2016-08-23 MED ORDER — OMEPRAZOLE 20 MG PO CPDR: 20.0000 mg | DELAYED_RELEASE_CAPSULE | Freq: Every day | ORAL | 3 refills | Status: DC

## 2016-08-31 ENCOUNTER — Ambulatory Visit (INDEPENDENT_AMBULATORY_CARE_PROVIDER_SITE_OTHER): Payer: Managed Care, Other (non HMO) | Admitting: Family Medicine

## 2016-08-31 ENCOUNTER — Other Ambulatory Visit: Payer: Self-pay | Admitting: Family Medicine

## 2016-08-31 ENCOUNTER — Ambulatory Visit (INDEPENDENT_AMBULATORY_CARE_PROVIDER_SITE_OTHER)
Admission: RE | Admit: 2016-08-31 | Discharge: 2016-08-31 | Disposition: A | Discharge status: Home / Self Care | Payer: Managed Care, Other (non HMO) | Source: Ambulatory Visit | Attending: Family Medicine | Admitting: Family Medicine

## 2016-08-31 ENCOUNTER — Encounter: Payer: Self-pay | Admitting: Family Medicine

## 2016-08-31 VITALS — BP 138/98 | HR 89 | Temp 98.4°F | Wt 216.0 lb

## 2016-08-31 DIAGNOSIS — N83202 Unspecified ovarian cyst, left side: Secondary | ICD-10-CM

## 2016-08-31 DIAGNOSIS — N200 Calculus of kidney: Secondary | ICD-10-CM

## 2016-08-31 DIAGNOSIS — R109 Unspecified abdominal pain: Secondary | ICD-10-CM | POA: Diagnosis not present

## 2016-08-31 LAB — POC URINALSYSI DIPSTICK (AUTOMATED)
Bilirubin, UA: NEGATIVE
GLUCOSE UA: NEGATIVE
Ketones, UA: NEGATIVE
Leukocytes, UA: NEGATIVE
NITRITE UA: NEGATIVE
SPEC GRAV UA: 1.02
UROBILINOGEN UA: 0.2
pH, UA: 6

## 2016-08-31 LAB — POCT URINE PREGNANCY: PREG TEST UR: NEGATIVE

## 2016-08-31 IMAGING — CT CT RENAL STONE PROTOCOL
2 of 4 series · 16 of 46 positions shown, 18 images · non-contrast
Comparison: None.

CLINICAL DATA: Acute onset left flank pain radiating anteriorly 2
days ago.

EXAM:
CT ABDOMEN AND PELVIS WITHOUT CONTRAST
TECHNIQUE: Multidetector CT imaging of the abdomen and pelvis was performed
following the standard protocol without IV contrast.

[Series 2: stone study 5.0 i30f 1 · axial · 0.76mm/px · z∈[-478,-58]mm · 13 of 94 slices shown, 15 images]
[im 5/94  soft-tissue]
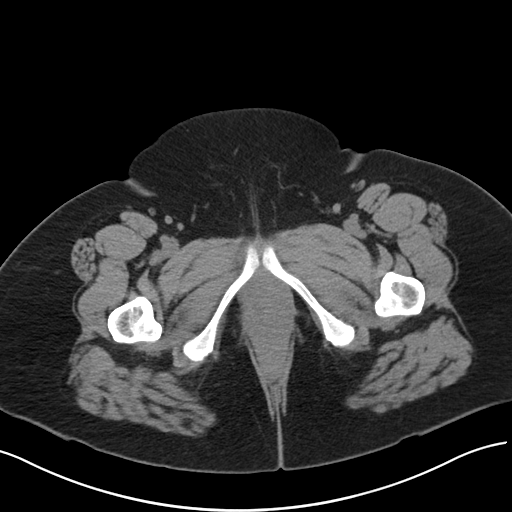
[im 5/94  bone]
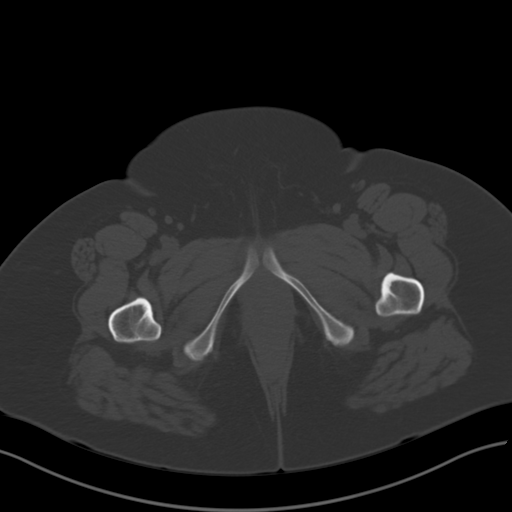
[im 13/94  soft-tissue]
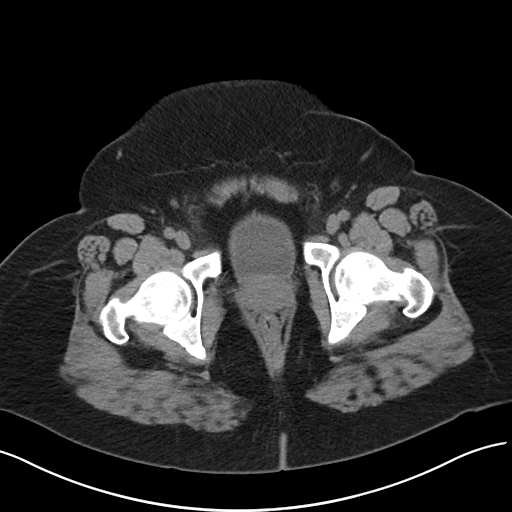
[im 21/94  soft-tissue]
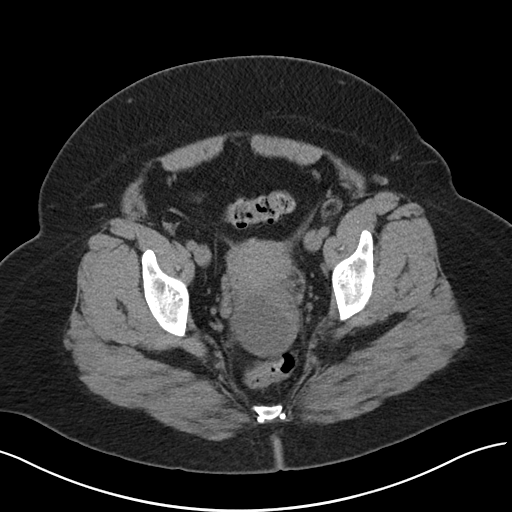
[im 25/94  soft-tissue]
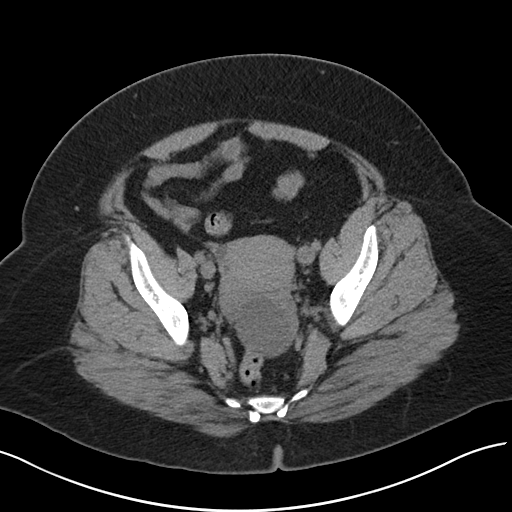
[im 33/94  soft-tissue]
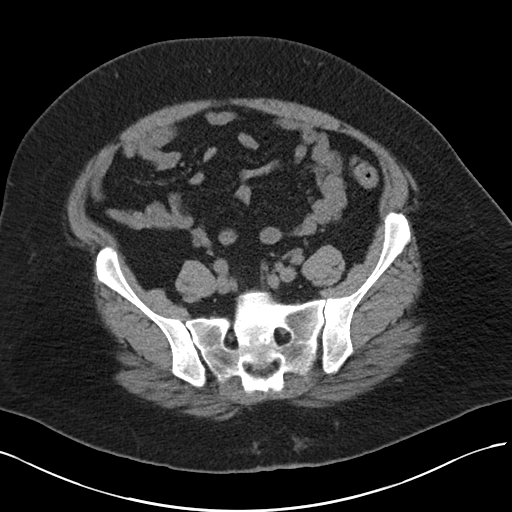
[im 41/94  soft-tissue]
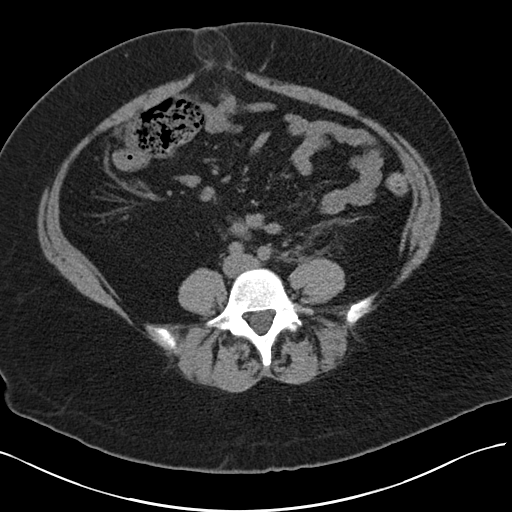
[im 49/94  soft-tissue]
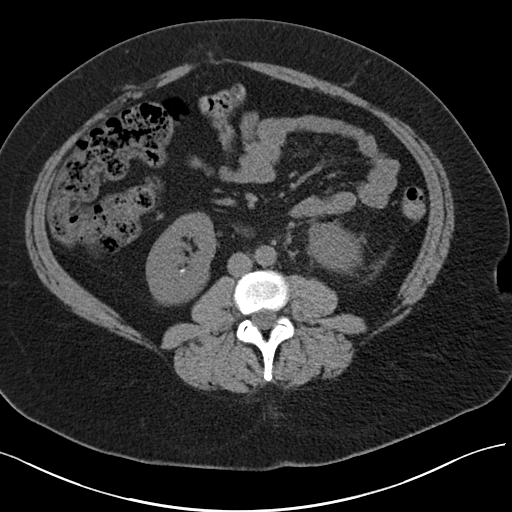
[im 53/94  soft-tissue]
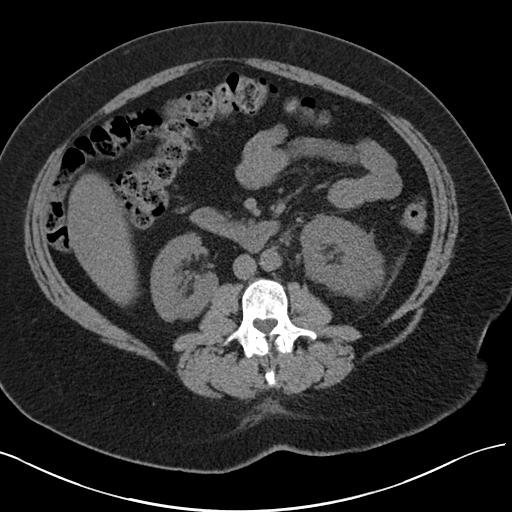
[im 61/94  soft-tissue]
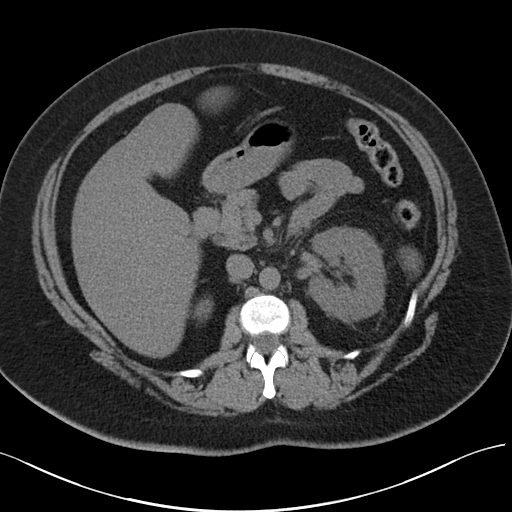
[im 61/94  bone]
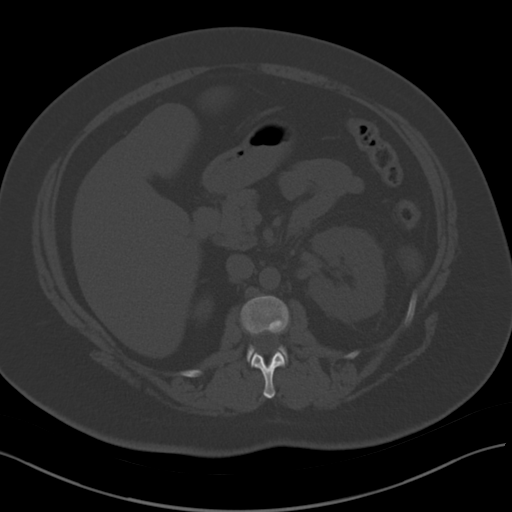
[im 69/94  soft-tissue]
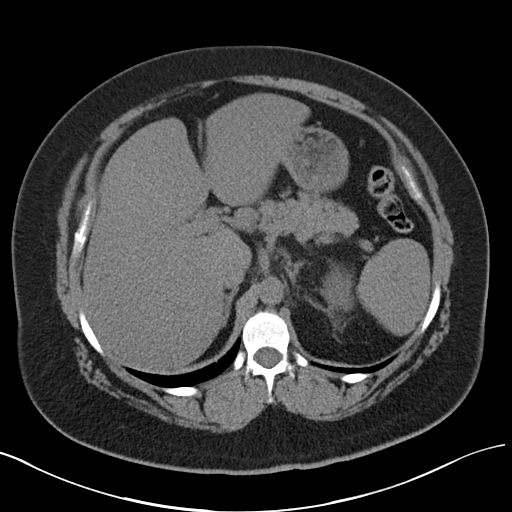
[im 73/94  soft-tissue]
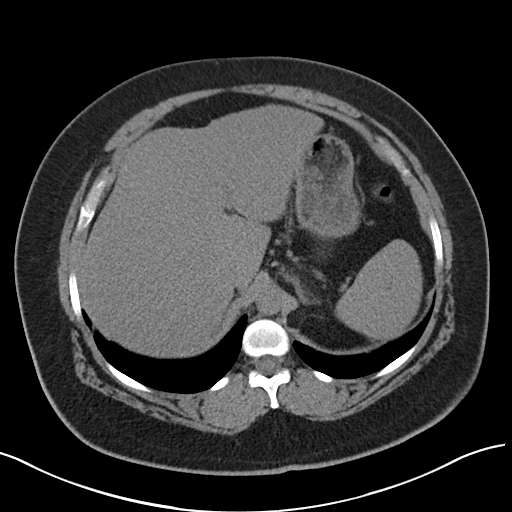
[im 81/94  soft-tissue]
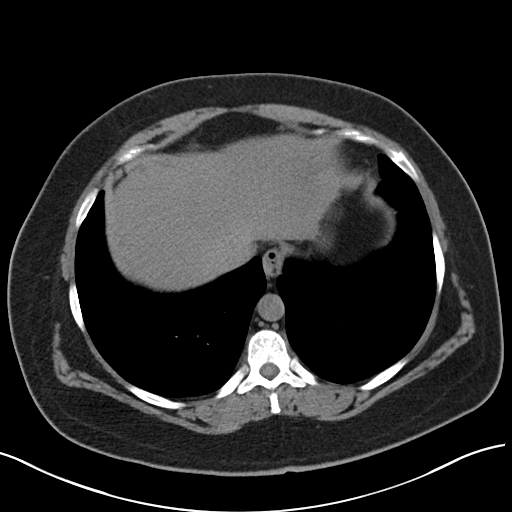
[im 89/94  soft-tissue]
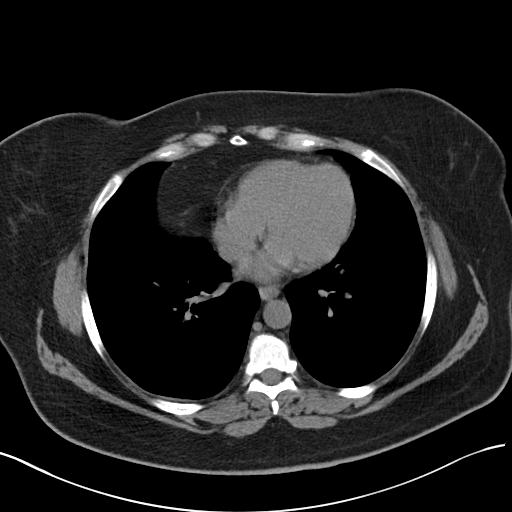

[Series 5: coronal soft tissue · coronal · 0.76mm/px · 3 of 101 slices shown]
[im 34/101  soft-tissue]
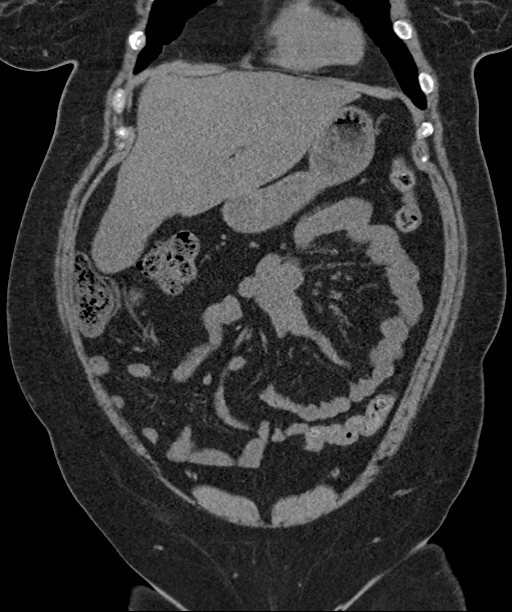
[im 45/101  soft-tissue]
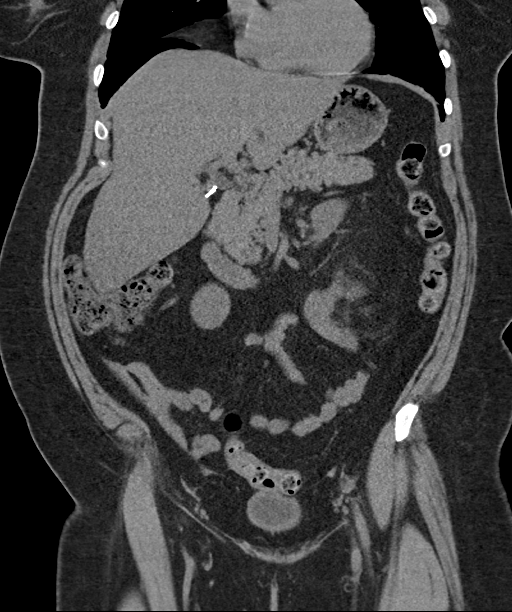
[im 56/101  soft-tissue]
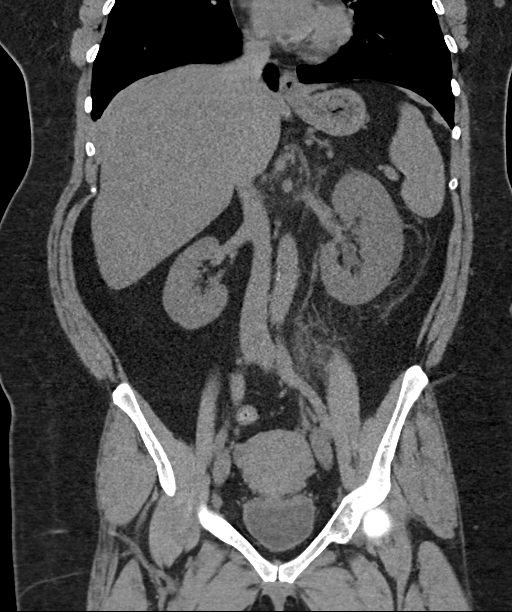

[16 of 46 positions shown; findings below may reference images not displayed]

FINDINGS: Lower chest: Lung bases are clear. No pleural or pericardial
effusion. Very small hiatal hernia is noted.

Hepatobiliary: Diffuse fatty infiltration of the liver is seen. The
patient is status post cholecystectomy.

Pancreas: Unremarkable.

Spleen: Unremarkable.

Adrenals/Urinary Tract: Stranding is identified about the left
kidney. There is mild to moderate left hydronephrosis due to a
cm stone at the left UPJ. A punctate nonobstructing stone is
identified in the upper pole left kidney. The patient also has a
cm stone in the lower pole of the right kidney. No right
hydronephrosis or ureteral stone. Urinary bladder is unremarkable.
The adrenal glands appear normal.

Stomach/Bowel: The stomach and small and large bowel appear normal.
No evidence of appendicitis is identified.

Vascular/Lymphatic: Unremarkable.

Reproductive: A cyst measuring 4.7 cm transverse by 5.4 cm AP on
axial image 72 by 5.3 cm craniocaudal on sagittal image 66 is
identified projecting posterior to the uterus and likely emanates
from the left ovary. Otherwise unremarkable.

Other: No lymphadenopathy or fluid.

Musculoskeletal: No lytic or sclerotic lesion.
IMPRESSION: Mild to moderate left hydronephrosis with stranding about the left
kidney due to a 0.6 cm stone at the left ureteropelvic junction.
Punctate nonobstructing stone upper pole left kidney and 0.4 cm
nonobstructing stone lower pole right kidney also noted.

5.4 cm cyst likely in the left ovary. Pelvic ultrasound is
recommended for further evaluation. This recommendation follows ACR
consensus guidelines: White Paper of the ACR Incidental Findings
Committee II on Adnexal Findings. [HOSPITAL] [DATE].

Diffuse fatty infiltration of the liver.

## 2016-08-31 MED ORDER — HYDROCODONE-ACETAMINOPHEN 10-325 MG PO TABS: 1.0000 | ORAL_TABLET | Freq: Three times a day (TID) | ORAL | 0 refills | Status: DC | PRN

## 2016-08-31 MED ORDER — ONDANSETRON 8 MG PO TBDP: 8.0000 mg | ORAL_TABLET | Freq: Three times a day (TID) | ORAL | 0 refills | Status: DC | PRN

## 2016-08-31 MED ORDER — KETOROLAC TROMETHAMINE 60 MG/2ML IM SOLN
60.0000 mg | Freq: Once | INTRAMUSCULAR | Status: AC
  Administered 2016-08-31: 60 mg via INTRAMUSCULAR

## 2016-08-31 MED ORDER — ONDANSETRON HCL 4 MG/2ML IJ SOLN
4.0000 mg | Freq: Once | INTRAMUSCULAR | Status: AC
  Administered 2016-08-31: 4 mg via INTRAMUSCULAR

## 2016-08-31 NOTE — Progress Notes (Signed)
Subjective:    Patient ID: Terri Richardson, female    DOB: [birth year], 45 y.o.   MRN: [id number]  HPI  [name] is a 45 year old female who presents today with left back pain that started 2 days ago.  Pain is rated as a 8- 9 and noted as an ache. She reports that pain can be less at times and then increase to a sharp level ranging from 4 to 9. She reports one episode of pain radiating around her left flank area.  Associated symptom of nausea present. She denies fever, chills, sweats, numbness, tingling, weakness, loss of bowel/bladder control, dysuria, frequency, urgency, or hematuria.  Treatment at home included 600 mg of ibuprofen one day ago which provided relief temporarily. She reports waking up this morning in pain and denies use of ibuprofen today and she took her medication for BP just prior to this visit. No history of kidney stones; family history of kidney stones.  No aggravating or alleviating factors are noted. Pain is not exacerbated with movement and she denies any trigger for the pain.  HTN:  Retake of BP is 138/98. She reports taking her medicine on her way to this appointment less than 20 minutes ago.  She denies recent elevated readings at home. She denies chest pain, palpitations, HA, numbness, weakness, tingling, or nosebleeds.   Review of Systems  Constitutional: Negative for chills, fatigue and fever.  Respiratory: Negative for cough and shortness of breath.   Cardiovascular: Negative for chest pain and palpitations.  Gastrointestinal: Positive for nausea. Negative for abdominal pain, diarrhea and vomiting.  Genitourinary: Negative for dysuria, frequency, hematuria and urgency.  Musculoskeletal: Positive for back pain. Negative for myalgias.  Skin: Negative for rash.  Neurological: Negative for dizziness, light-headedness and headaches.   Past Medical History:  Diagnosis Date  . Diabetes mellitus 2006     Social History   Social History  . Marital status:  Married    Spouse name: N/A  . Number of children: N/A  . Years of education: N/A   Occupational History  . Works at [employer]    Social History Main Topics  . Smoking status: Former Smoker    Packs/day: 0.50    Years: 4.00    Types: Cigarettes    Quit date: 02/20/2014  . Smokeless tobacco: Never Used  . Alcohol use No  . Drug use: No  . Sexual activity: Yes    Birth control/ protection: None   Other Topics Concern  . Not on file   Social History Narrative  . No narrative on file    Past Surgical History:  Procedure Laterality Date  . APPENDECTOMY  1st grade  . CESAREAN SECTION  2008  . CHOLECYSTECTOMY  11/11/2011   Procedure: LAPAROSCOPIC CHOLECYSTECTOMY WITH INTRAOPERATIVE CHOLANGIOGRAM;  Surgeon: Jetty DuhamelJames O Wyatt III, MD;  Location: [facility] OR;  Service: General;  Laterality: N/A;  . TONSILLECTOMY  6th grade    Family History  Problem Relation Age of Onset  . Hyperlipidemia Mother   . Hypertension Father   . Tongue cancer Maternal Grandfather   . Prostate cancer Paternal Uncle   . Kidney cancer Maternal Uncle   . Heart disease Brother   . Hypertension Brother     No Known Allergies  Current Outpatient Prescriptions on File Prior to Visit  Medication Sig Dispense Refill  . albuterol (PROVENTIL HFA;VENTOLIN HFA) 108 (90 Base) MCG/ACT inhaler Inhale 2 puffs into the lungs every 6 (six) hours as needed for  wheezing or shortness of breath. 1 Inhaler 0  . amLODipine (NORVASC) 5 MG tablet Take 1 tablet (5 mg total) by mouth daily. 90 tablet 1  . azithromycin (ZITHROMAX) 250 MG tablet Take 2 tablets today then take one tablet daily for 4 days 6 tablet 0  . ibuprofen (ADVIL,MOTRIN) 600 MG tablet Take 1 tablet (600 mg total) by mouth every 6 (six) hours as needed. 30 tablet 0  . losartan (COZAAR) 100 MG tablet Take 1 tablet (100 mg total) by mouth daily. 90 tablet 1  . omeprazole (PRILOSEC) 20 MG capsule Take 1 capsule (20 mg total) by mouth daily. 90 capsule 3  . predniSONE  (DELTASONE) 10 MG tablet Take 4 tablets once daily for 2 days, 3 tablets once daily for 2 days, 2 tablets once daily for 2 days, and 1 tablet once daily for 2 days 20 tablet 0  . SYMBICORT 160-4.5 MCG/ACT inhaler Inhale 2 puffs into the lungs 2 (two) times daily.  11   No current facility-administered medications on file prior to visit.     BP (!) 138/98   Pulse 89   Temp 98.4 F (36.9 C) (Oral)   Wt 216 lb (98 kg)   SpO2 97%   BMI 39.51 kg/m         Objective:   Physical Exam  Constitutional: She is oriented to person, place, and time. She appears well-developed and well-nourished.  Eyes: Pupils are equal, round, and reactive to light. No scleral icterus.  Neck: Neck supple.  Cardiovascular: Normal rate and regular rhythm.   Pulmonary/Chest: Effort normal and breath sounds normal. She has no wheezes. She has no rales.  Abdominal: Soft. Bowel sounds are normal. There is no tenderness. There is CVA tenderness. There is no rebound, no tenderness at McBurney's point and negative Murphy's sign.  No suprapubic tenderness present  Musculoskeletal:  Spine with normal alignment and no deformity. No tenderness to vertebral process and paraspinous muscles. ROM is full at lumbar sacral regions. Negative Straight Leg raise. Left CVA tenderness present. Able to heel/toe walk without change in pain.  Lymphadenopathy:    She has no cervical adenopathy.  Neurological: She is alert and oriented to person, place, and time. Coordination normal.  Skin: Skin is warm and dry.  Psychiatric: She has a normal mood and affect. Her behavior is normal. Judgment and thought content normal.        Assessment & Plan:  1. Acute left flank pain CVA tenderness noted; UA indicates trace of blood in urine; Obvious discomfort with exam and CVA tenderness suspect possible kidney stone. In office Ketoralac and ondansetron provided for discomfort. Renal study scheduled today at 3:15pm. Provided limited amount of  oral pain medication and advised patient regarding warning signs that require immediate follow up such as pain that is not controlled with medication, fever, chills, increase in intensity of pain, or unable to urinate. Further advised patient to drink enough water to keep urine pale yellow or clear and follow up for renal study today. Patient and husband voiced understanding and agreed with plan.  - POCT Urinalysis Dipstick (Automated) - CT RENAL STONE STUDY; Future - HYDROcodone-acetaminophen (NORCO) 10-325 MG tablet; Take 1 tablet by mouth every 8 (eight) hours as needed.  Dispense: 15 tablet; Refill: 0 - ondansetron (ZOFRAN ODT) 8 MG disintegrating tablet; Take 1 tablet (8 mg total) by mouth every 8 (eight) hours as needed for nausea or vomiting.  Dispense: 20 tablet; Refill: 0 - ketorolac (TORADOL) injection 60  mg; Inject 2 mLs (60 mg total) into the muscle once. - ondansetron (ZOFRAN) injection 4 mg; Inject 2 mLs (4 mg total) into the muscle once. - POCT urine pregnancy  Roddie McJulia Yaminah Clayborn, FNP-C

## 2016-08-31 NOTE — Patient Instructions (Signed)
Please go for imaging as recommended for evaluation of possible kidney stone.  Kidney Stones Kidney stones (urolithiasis) are deposits that form inside your kidneys. The intense pain is caused by the stone moving through the urinary tract. When the stone moves, the ureter goes into spasm around the stone. The stone is usually passed in the urine.  CAUSES   A disorder that makes certain neck glands produce too much parathyroid hormone (primary hyperparathyroidism).  A buildup of uric acid crystals, similar to gout in your joints.  Narrowing (stricture) of the ureter.  A kidney obstruction present at birth (congenital obstruction).  Previous surgery on the kidney or ureters.  Numerous kidney infections. SYMPTOMS   Feeling sick to your stomach (nauseous).  Throwing up (vomiting).  Blood in the urine (hematuria).  Pain that usually spreads (radiates) to the groin.  Frequency or urgency of urination. DIAGNOSIS   Taking a history and physical exam.  Blood or urine tests.  CT scan.  Occasionally, an examination of the inside of the urinary bladder (cystoscopy) is performed. TREATMENT   Observation.  Increasing your fluid intake.  Extracorporeal shock wave lithotripsy--This is a noninvasive procedure that uses shock waves to break up kidney stones.  Surgery may be needed if you have severe pain or persistent obstruction. There are various surgical procedures. Most of the procedures are performed with the use of small instruments. Only small incisions are needed to accommodate these instruments, so recovery time is minimized. The size, location, and chemical composition are all important variables that will determine the proper choice of action for you. Talk to your health care provider to better understand your situation so that you will minimize the risk of injury to yourself and your kidney.  HOME CARE INSTRUCTIONS   Drink enough water and fluids to keep your urine clear or  pale yellow. This will help you to pass the stone or stone fragments.  Strain all urine through the provided strainer. Keep all particulate matter and stones for your health care provider to see. The stone causing the pain may be as small as a grain of salt. It is very important to use the strainer each and every time you pass your urine. The collection of your stone will allow your health care provider to analyze it and verify that a stone has actually passed. The stone analysis will often identify what you can do to reduce the incidence of recurrences.  Only take over-the-counter or prescription medicines for pain, discomfort, or fever as directed by your health care provider.  Keep all follow-up visits as told by your health care provider. This is important.  Get follow-up X-rays if required. The absence of pain does not always mean that the stone has passed. It may have only stopped moving. If the urine remains completely obstructed, it can cause loss of kidney function or even complete destruction of the kidney. It is your responsibility to make sure X-rays and follow-ups are completed. Ultrasounds of the kidney can show blockages and the status of the kidney. Ultrasounds are not associated with any radiation and can be performed easily in a matter of minutes.  Make changes to your daily diet as told by your health care provider. You may be told to:  Limit the amount of salt that you eat.  Eat 5 or more servings of fruits and vegetables each day.  Limit the amount of meat, poultry, fish, and eggs that you eat.  Collect a 24-hour urine sample as told by  your health care provider.You may need to collect another urine sample every 6-12 months. SEEK MEDICAL CARE IF:  You experience pain that is progressive and unresponsive to any pain medicine you have been prescribed. SEEK IMMEDIATE MEDICAL CARE IF:   Pain cannot be controlled with the prescribed medicine.  You have a fever or shaking  chills.  The severity or intensity of pain increases over 18 hours and is not relieved by pain medicine.  You develop a new onset of abdominal pain.  You feel faint or pass out.  You are unable to urinate.   This information is not intended to replace advice given to you by your health care provider. Make sure you discuss any questions you have with your health care provider.   Document Released: 10/09/2005 Document Revised: 06/30/2015 Document Reviewed: 03/12/2013 Elsevier Interactive Patient Education [employer]2016 [employer].

## 2016-08-31 NOTE — Progress Notes (Signed)
US for evaluation of left ovarian cyst noted on renal study. Contacted patient regarding US and she will follow up with urology tomorrow for further evaluation and treatment of kidney stones prior to US study.

## 2016-08-31 NOTE — Progress Notes (Signed)
Pre visit review using our clinic review tool, if applicable. No additional management support is needed unless otherwise documented below in the visit note. 

## 2016-09-04 ENCOUNTER — Other Ambulatory Visit: Payer: Self-pay | Admitting: Urology

## 2016-09-05 ENCOUNTER — Other Ambulatory Visit: Payer: Self-pay | Admitting: Family Medicine

## 2016-09-05 ENCOUNTER — Telehealth: Payer: Self-pay | Admitting: Family Medicine

## 2016-09-05 ENCOUNTER — Encounter (HOSPITAL_COMMUNITY): Payer: Self-pay | Admitting: *Deleted

## 2016-09-05 DIAGNOSIS — N83202 Unspecified ovarian cyst, left side: Secondary | ICD-10-CM

## 2016-09-05 NOTE — Progress Notes (Signed)
US complete pelvis for evaluation of suspected left ovarian cyst identified with renal stone study.

## 2016-09-05 NOTE — Telephone Encounter (Signed)
US pelvic completed ordered for evaluation of suspected left ovarian cyst. [facility]

## 2016-09-05 NOTE — Telephone Encounter (Signed)
° ° °  [facility] call to say that they also need to have a US Pelvis added to the current order

## 2016-09-06 NOTE — Telephone Encounter (Signed)
Pelvic ultrasound is advised for evaluation of ovarian cyst identified on renal study. Recommend that she have this completed after she has seen urology and after resolution of kidney stones with lithotripsy.

## 2016-09-06 NOTE — H&P (Signed)
CC/HPI: Left ureteral calculus    [name] is a pleasant 45 year old female seen today at the request of Roddie McJulia Kordsmeier, FNP. she developed the acute onset of left-sided flank pain 2 days ago with associated nausea. She has denied any fever or gross hematuria. She denies a history of prior stones. She did undergo a CT scan without contrast of the abdomen and pelvis on 08/31/16 that confirmed a 6 mm left proximal ureteral calculus. Her pain has been relatively well controlled. She has required fairly minimal pain medication overnight. She does not have significant nausea or vomiting today.     ALLERGIES: None   MEDICATIONS: Tamsulosin Hcl 0.4 mg capsule, ext release 24 hr  Advil  Cozaar 100 mg tablet  Hydrocodone-Acetaminophen 5 mg-325 mg tablet  Norvasc 5 mg tablet  Prednisone 10 mg tablet  Prilosec Otc 20 mg tablet, delayed release  Proventil Hfa 90 mcg hfa aerosol with adapter  Symbicort 160 mcg-4.5 mcg/actuation hfa aerosol with adapter  Zithromax  Zofran 4 mg tablet     GU PSH: None   NON-GU PSH: Appendectomy Cholecystectomy C-Section Tonsillectomy    GU PMH: None   NON-GU PMH: Anxiety Asthma Depression Diabetes Type 2 GERD Hypertension    FAMILY HISTORY: Kidney Cancer - Uncle Urinary Calculus - Brother   SOCIAL HISTORY: Marital Status: Married Current Smoking Status: Patient does not smoke anymore. Has not smoked since 08/23/2014. Smoked for 4 years. Smoked less than 1/2 pack per day.  Has never drank.  Drinks 2 caffeinated drinks per day.    REVIEW OF SYSTEMS:    GU Review Female:   Patient denies frequent urination, hard to postpone urination, burning /pain with urination, get up at night to urinate, leakage of urine, stream starts and stops, trouble starting your stream, have to strain to urinate, and currently pregnant.  Gastrointestinal (Lower):   Patient denies diarrhea and constipation.  Gastrointestinal (Upper):   Patient reports nausea. Patient  denies vomiting.  Constitutional:   Patient denies fever, night sweats, weight loss, and fatigue.  Skin:   Patient denies skin rash/ lesion and itching.  Eyes:   Patient denies blurred vision and double vision.  Ears/ Nose/ Throat:   Patient reports sinus problems. Patient denies sore throat.  Hematologic/Lymphatic:   Patient denies swollen glands and easy bruising.  Cardiovascular:   Patient denies leg swelling and chest pains.  Respiratory:   Patient reports cough and shortness of breath.   Endocrine:   Patient denies excessive thirst.  Musculoskeletal:   Patient denies back pain and joint pain.  Neurological:   Patient denies headaches and dizziness.  Psychologic:   Patient denies anxiety and depression.   VITAL SIGNS:      09/01/2016 12:26 PM  Weight 216 lb / 97.98 kg  Height 62 in / 157.48 cm  BP 144/91 mmHg  Pulse 102 /min  Temperature 98.1 F / 37 C  BMI 39.5 kg/m   MULTI-SYSTEM PHYSICAL EXAMINATION:    Constitutional: Well-nourished. No physical deformities. Normally developed. Good grooming.  Neck: Neck symmetrical, not swollen. Normal tracheal position.  Respiratory: No labored breathing, no use of accessory muscles. Clear bilaterally.  Cardiovascular: Normal temperature, normal extremity pulses, no swelling, no varicosities. Regular rate and rhythm.  Lymphatic: No enlargement of neck, axillae, groin.  Skin: No paleness, no jaundice, no cyanosis. No lesion, no ulcer, no rash.  Neurologic / Psychiatric: Oriented to time, oriented to place, oriented to person. No depression, no anxiety, no agitation.  Gastrointestinal: No mass, no  tenderness, no rigidity, non obese abdomen.  Eyes: Normal conjunctivae. Normal eyelids.  Ears, Nose, Mouth, and Throat: Left ear no scars, no lesions, no masses. Right ear no scars, no lesions, no masses. Nose no scars, no lesions, no masses. Normal hearing. Normal lips.  Musculoskeletal: Normal gait and station of head and neck.     PAST DATA  REVIEWED:  Source Of History:  Patient  Lab Test Review:   PSA  Urine Test Review:   Urinalysis  X-Ray Review: KUB: Reviewed Films. I reviewed her KUB. There is a radio opaque calcification at the level of the left proximal ureter consistent with her known calculus. She also has a left pelvic phlebolith.    PROCEDURES:         KUB - 74000  A single view of the abdomen is obtained.               Urinalysis w/Scope Dipstick Dipstick Cont'd Micro  Color: Yellow Bilirubin: Neg WBC/hpf: 6 - 10/hpf  Appearance: Cloudy Ketones: Neg RBC/hpf: 0 - 2/hpf  Specific Gravity: 1.020 Blood: 2+ Bacteria: Mod (26-50/hpf)  pH: <=5.0 Protein: Neg Cystals: NS (Not Seen)  Glucose: Neg Urobilinogen: 0.2 Casts: NS (Not Seen)    Nitrites: Neg Trichomonas: Not Present    Leukocyte Esterase: Neg Mucous: Present      Epithelial Cells: 0 - 5/hpf      Yeast: NS (Not Seen)      Sperm: Not Present    ASSESSMENT:      ICD-10 Details  1 GU:   Calculus Ureter - N20.1   2   Calculus Kidney and Ureter - N20.2   3 NON-GU:   Bacteriuria - R82.71    PLAN:            Medications New Meds: Hydrocodone-Acetaminophen 5 mg-325 mg tablet 1-2 tablet PO Q 6 H prn   #20  0 Refill(s)            Orders Labs Urine Culture and Sensitivity, BMP  X-Rays: KUB          Schedule Return Visit: Other See Visit Notes             Note: Will call to schedule ESWL          Document Letter(s):  Created for Patient: Clinical Summary         Notes:   1. left ureteral calculus: Her renal function will be checked today. After reviewing available options for management, she is most interested in an initial approach with medical expulsion therapy. She will strain her urine and we will begin alpha-blocker therapy for the next 2 weeks. We have decided to tentatively plan on intervention if she does not pass her stone over that period of time. After reviewing options and specifically focusing on shockwave lithotripsy and  ureteroscopic laser lithotripsy, she is most interested in shockwave lithotripsy. We did discuss appropriate expectations with regard to the success rate and her increase skin to stone distance. We have reviewed this procedure in detail and she is agreeable to proceed. She gives informed consent. She will notify us if she passes her stone. She also has been instructed that should she develop fever, persistent nausea/vomiting, or uncontrolled pain that she should notify us. She has been provided a prescription for additional hydrocodone.

## 2016-09-07 ENCOUNTER — Encounter (HOSPITAL_COMMUNITY)
Admission: RE | Disposition: A | Discharge status: Home / Self Care | Payer: Self-pay | Source: Ambulatory Visit | Attending: Urology

## 2016-09-07 ENCOUNTER — Ambulatory Visit: Payer: Managed Care, Other (non HMO) | Admitting: Family Medicine

## 2016-09-07 ENCOUNTER — Ambulatory Visit (HOSPITAL_COMMUNITY): Payer: Managed Care, Other (non HMO)

## 2016-09-07 ENCOUNTER — Ambulatory Visit (HOSPITAL_COMMUNITY)
Admission: RE | Admit: 2016-09-07 | Discharge: 2016-09-07 | Disposition: A | Discharge status: Home / Self Care | Payer: Managed Care, Other (non HMO) | Source: Ambulatory Visit | Attending: Urology | Admitting: Urology

## 2016-09-07 ENCOUNTER — Encounter (HOSPITAL_COMMUNITY): Payer: Self-pay | Admitting: General Practice

## 2016-09-07 DIAGNOSIS — Z87891 Personal history of nicotine dependence: Secondary | ICD-10-CM | POA: Diagnosis not present

## 2016-09-07 DIAGNOSIS — Z7952 Long term (current) use of systemic steroids: Secondary | ICD-10-CM | POA: Diagnosis not present

## 2016-09-07 DIAGNOSIS — J45909 Unspecified asthma, uncomplicated: Secondary | ICD-10-CM | POA: Diagnosis not present

## 2016-09-07 DIAGNOSIS — E119 Type 2 diabetes mellitus without complications: Secondary | ICD-10-CM | POA: Insufficient documentation

## 2016-09-07 DIAGNOSIS — K219 Gastro-esophageal reflux disease without esophagitis: Secondary | ICD-10-CM | POA: Diagnosis not present

## 2016-09-07 DIAGNOSIS — N202 Calculus of kidney with calculus of ureter: Secondary | ICD-10-CM | POA: Diagnosis not present

## 2016-09-07 DIAGNOSIS — Z79899 Other long term (current) drug therapy: Secondary | ICD-10-CM | POA: Diagnosis not present

## 2016-09-07 DIAGNOSIS — Z7951 Long term (current) use of inhaled steroids: Secondary | ICD-10-CM | POA: Diagnosis not present

## 2016-09-07 DIAGNOSIS — R109 Unspecified abdominal pain: Secondary | ICD-10-CM

## 2016-09-07 DIAGNOSIS — N201 Calculus of ureter: Secondary | ICD-10-CM | POA: Diagnosis present

## 2016-09-07 DIAGNOSIS — I10 Essential (primary) hypertension: Secondary | ICD-10-CM | POA: Diagnosis not present

## 2016-09-07 IMAGING — CR DG ABDOMEN 1V
2 series · 2 of 2 positions shown · non-contrast
Comparison: KUB [DATE]

CLINICAL DATA: Preoperative examination for left ureteral stone.

EXAM:
ABDOMEN - 1 VIEW

[t abdomen supine (1 of 2)]
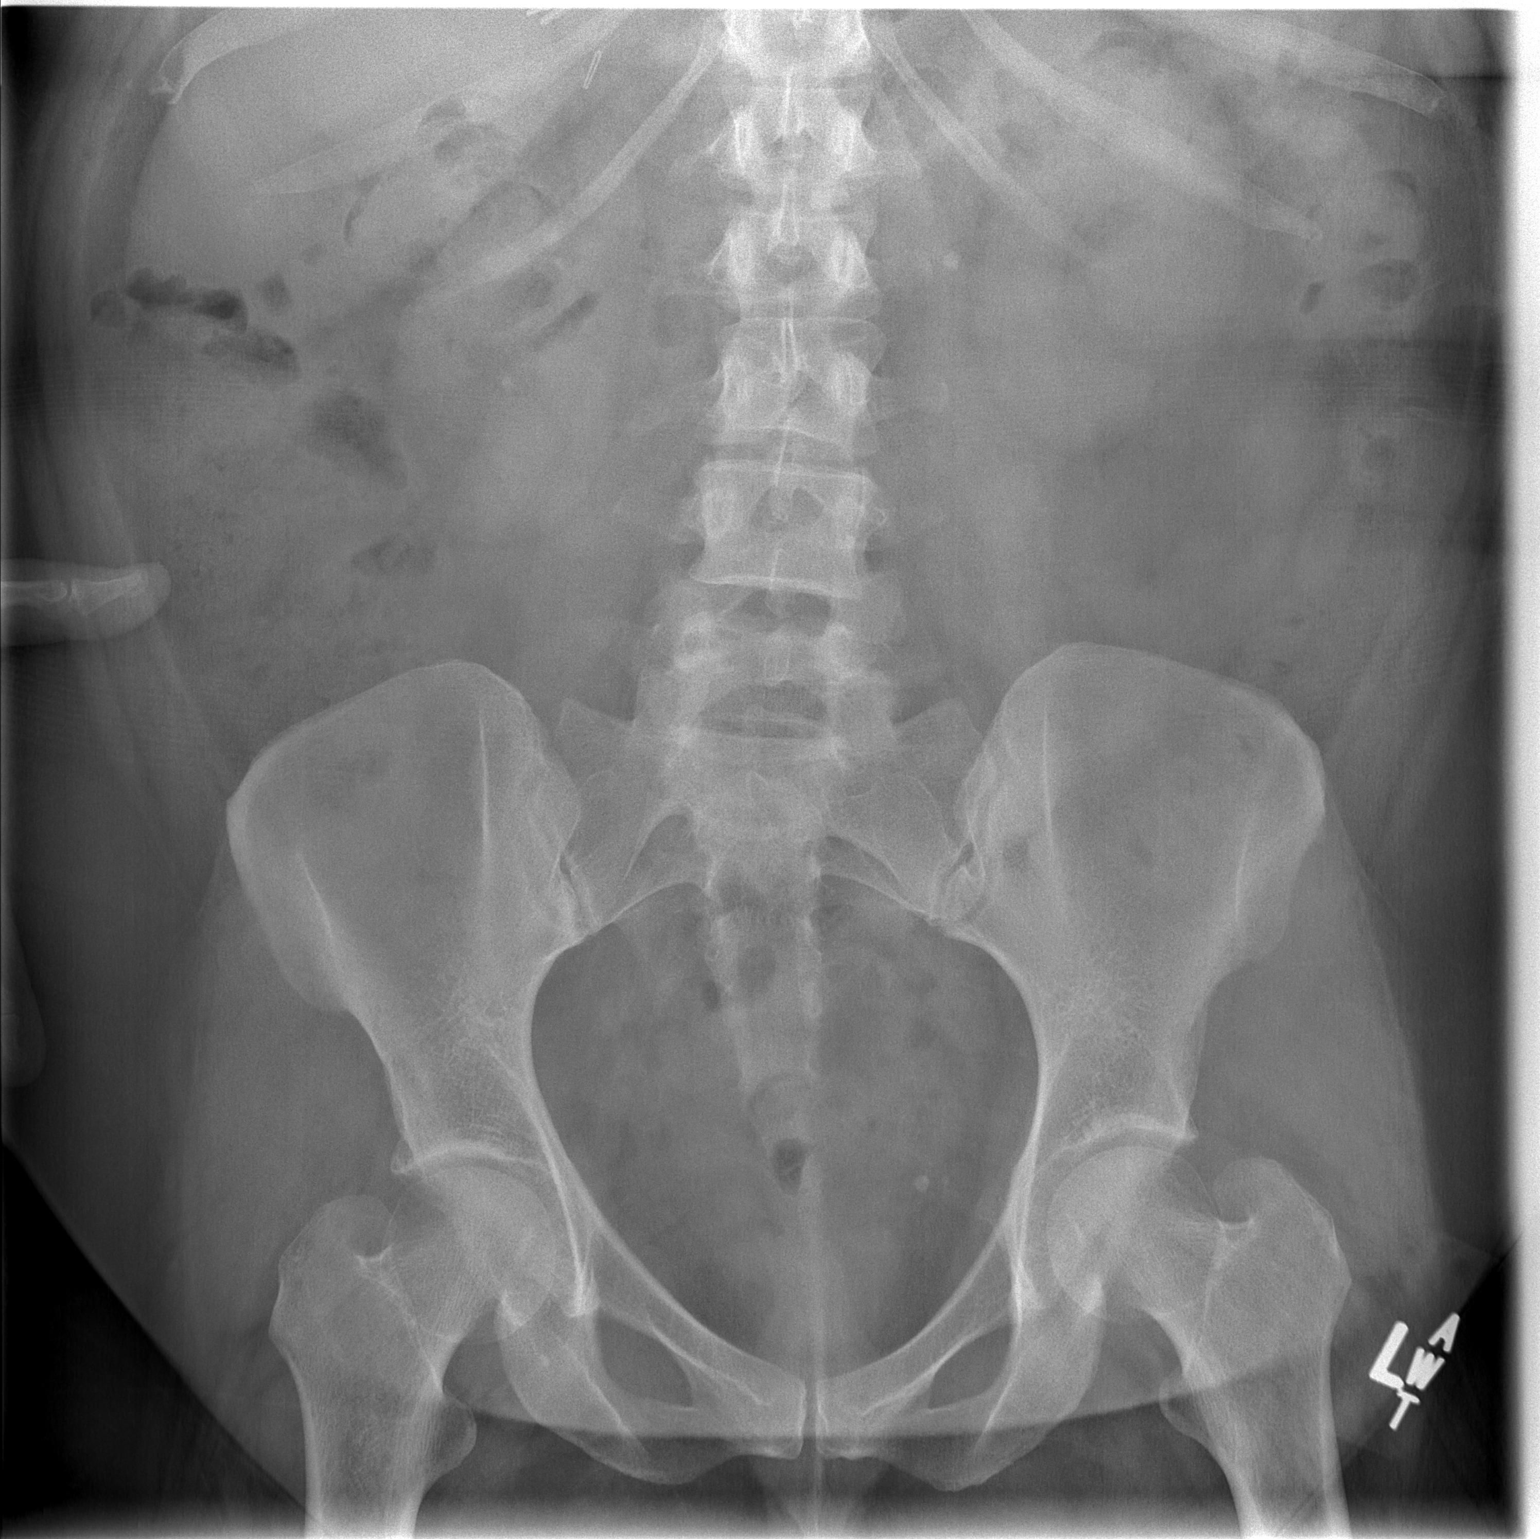

[t abdomen supine (2 of 2)]
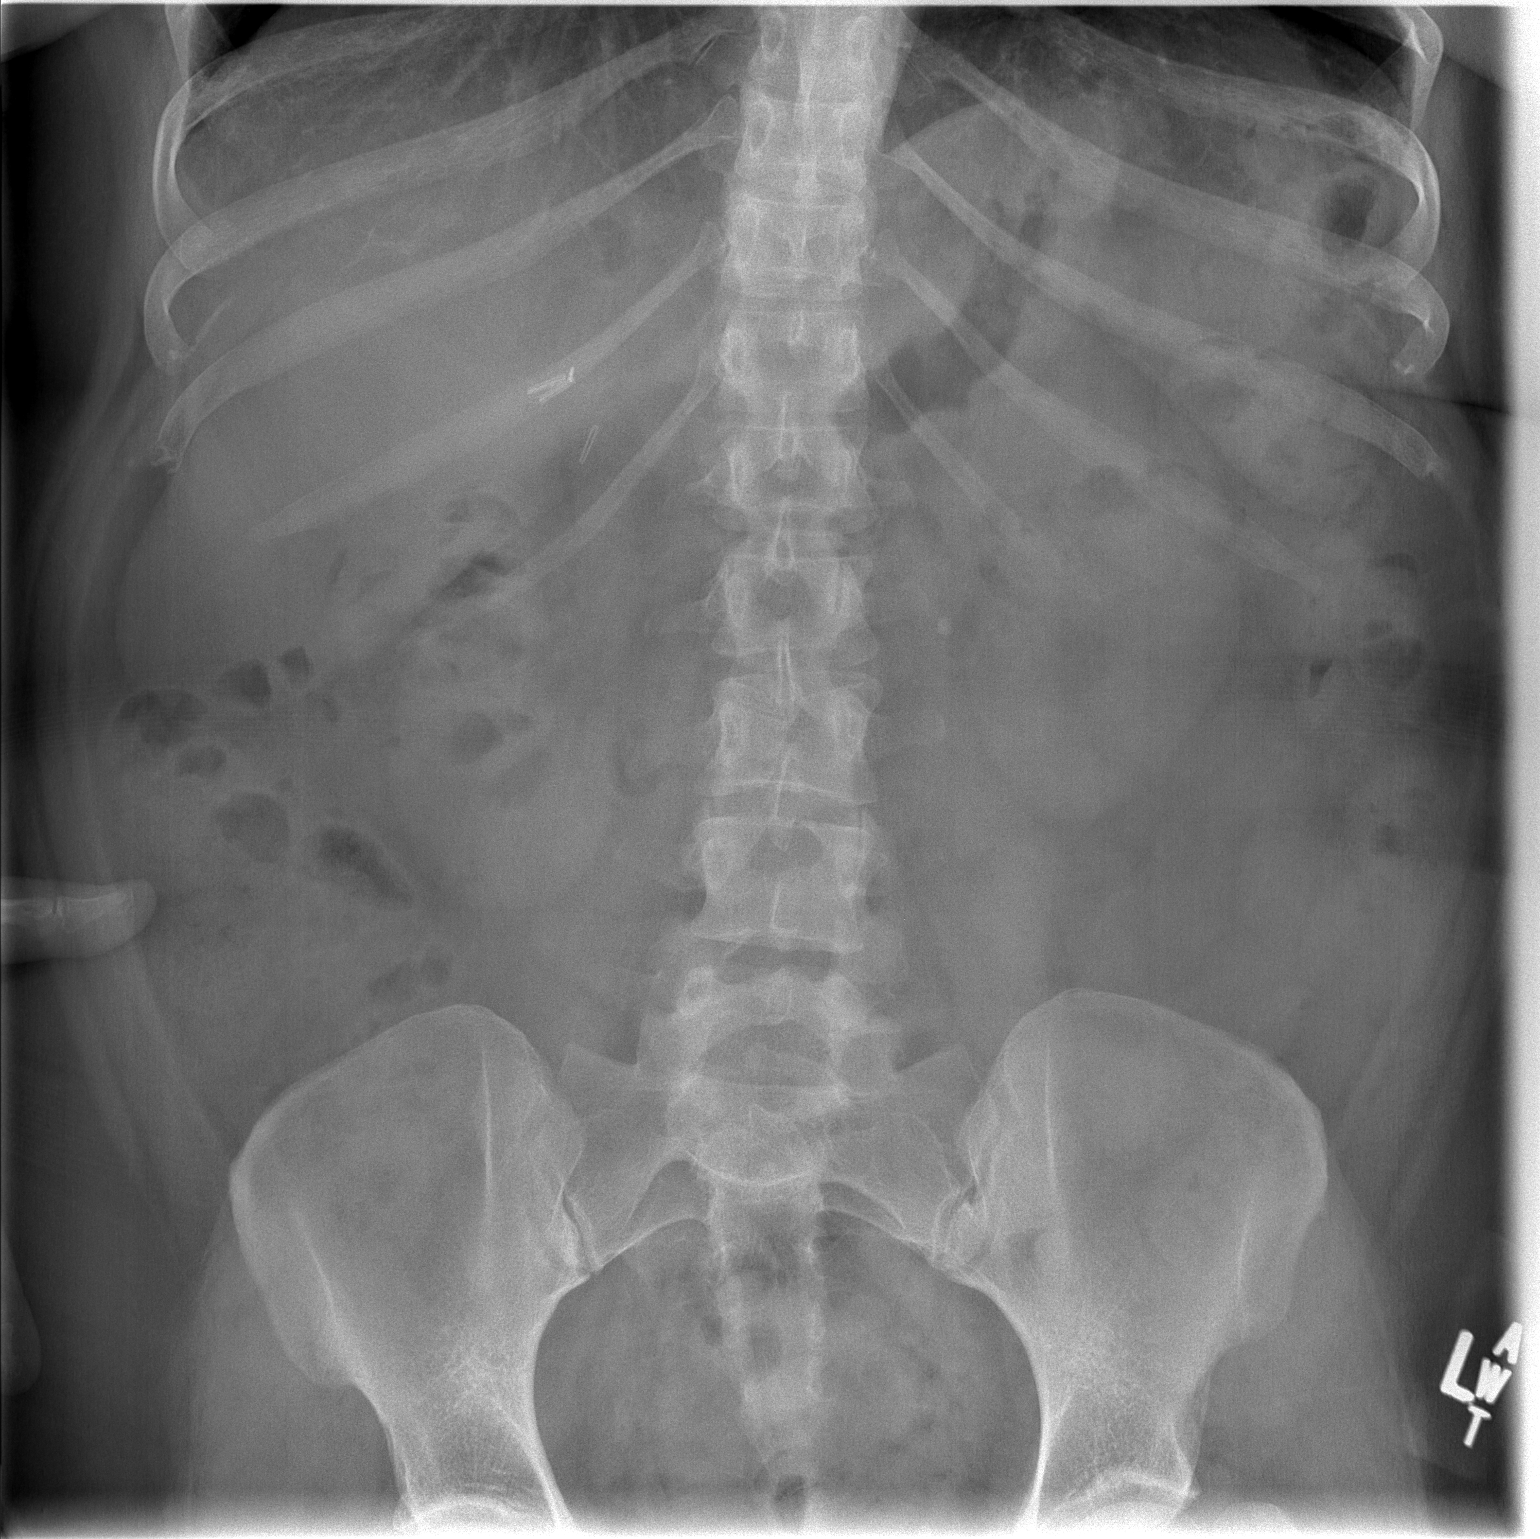

[2 of 2 positions shown; findings below may reference images not displayed]

FINDINGS: There is a stable approximately 4 mm diameter stone at the left UPJ.
There are at least 3 stones measuring up to 3 mm in diameter in the
right kidney. There is a 3 mm stone that projects in the distal
third of the left ureter based on the CT appearance from [DATE].

The bowel gas pattern is normal. The bony structures are
unremarkable. There surgical clips in the gallbladder fossa.
IMPRESSION: Left UPJ in distal left ureteral stones, stable. Stable right-sided
kidney stones.

## 2016-09-07 SURGERY — LITHOTRIPSY, ESWL
Anesthesia: LOCAL | Laterality: Left

## 2016-09-07 MED ORDER — DIAZEPAM 5 MG PO TABS
10.0000 mg | ORAL_TABLET | ORAL | Status: AC
  Administered 2016-09-07: 10 mg via ORAL
  Filled 2016-09-07: qty 2

## 2016-09-07 MED ORDER — DIPHENHYDRAMINE HCL 25 MG PO CAPS
25.0000 mg | ORAL_CAPSULE | ORAL | Status: AC
  Administered 2016-09-07: 25 mg via ORAL
  Filled 2016-09-07: qty 1

## 2016-09-07 MED ORDER — OXYCODONE HCL 5 MG PO TABS: 5.0000 mg | ORAL_TABLET | ORAL | Status: DC | PRN

## 2016-09-07 MED ORDER — CIPROFLOXACIN HCL 500 MG PO TABS
500.0000 mg | ORAL_TABLET | ORAL | Status: AC
  Administered 2016-09-07: 500 mg via ORAL
  Filled 2016-09-07: qty 1

## 2016-09-07 MED ORDER — ACETAMINOPHEN 325 MG PO TABS: 650.0000 mg | ORAL_TABLET | ORAL | Status: DC | PRN

## 2016-09-07 MED ORDER — HYDROCODONE-ACETAMINOPHEN 10-325 MG PO TABS: 1.0000 | ORAL_TABLET | Freq: Four times a day (QID) | ORAL | 0 refills | Status: DC | PRN

## 2016-09-07 MED ORDER — SODIUM CHLORIDE 0.9 % IV SOLN: 250.0000 mL | INTRAVENOUS | Status: DC | PRN

## 2016-09-07 MED ORDER — SODIUM CHLORIDE 0.9% FLUSH: 3.0000 mL | INTRAVENOUS | Status: DC | PRN

## 2016-09-07 MED ORDER — SODIUM CHLORIDE 0.9 % IV SOLN
INTRAVENOUS | Status: DC
  Administered 2016-09-07: 10:00:00 via INTRAVENOUS

## 2016-09-07 MED ORDER — ONDANSETRON 8 MG PO TBDP: 8.0000 mg | ORAL_TABLET | Freq: Three times a day (TID) | ORAL | 0 refills | Status: DC | PRN

## 2016-09-07 MED ORDER — SODIUM CHLORIDE 0.9% FLUSH: 3.0000 mL | Freq: Two times a day (BID) | INTRAVENOUS | Status: DC

## 2016-09-07 MED ORDER — ACETAMINOPHEN 650 MG RE SUPP
650.0000 mg | RECTAL | Status: DC | PRN
  Filled 2016-09-07: qty 1

## 2016-09-07 MED ORDER — FENTANYL CITRATE (PF) 100 MCG/2ML IJ SOLN: 25.0000 ug | INTRAMUSCULAR | Status: DC | PRN

## 2016-09-07 NOTE — Discharge Instructions (Signed)
Lithotripsy, Care After °Refer to this sheet in the next few weeks. These instructions provide you with information on caring for yourself after your procedure. Your health care provider may also give you more specific instructions. Your treatment has been planned according to current medical practices, but problems sometimes occur. Call your health care provider if you have any problems or questions after your procedure. °WHAT TO EXPECT AFTER THE PROCEDURE  °· Your urine may have a red tinge for a few days after treatment. Blood loss is usually minimal. °· You may have soreness in the back or flank area. This usually goes away after a few days. The procedure can cause blotches or bruises on the back where the pressure wave enters the skin. These marks usually cause only minimal discomfort and should disappear in a short time. °· Stone fragments should begin to pass within 24 hours of treatment. However, a delayed passage is not unusual. °· You may have pain, discomfort, and feel sick to your stomach (nauseated) when the crushed fragments of stone are passed down the tube from the kidney to the bladder. Stone fragments can pass soon after the procedure and may last for up to 4-8 weeks. °· A small number of patients may have severe pain when stone fragments are not able to pass, which leads to an obstruction. °· If your stone is greater than 1 inch (2.5 cm) in diameter or if you have multiple stones that have a combined diameter greater than 1 inch (2.5 cm), you may require more than one treatment. °· If you had a stent placed prior to your procedure, you may experience some discomfort, especially during urination. You may experience the pain or discomfort in your flank or back, or you may experience a sharp pain or discomfort at the base of your penis or in your lower abdomen. The discomfort usually lasts only a few minutes after urinating. °HOME CARE INSTRUCTIONS  °· Rest at home until you feel your energy  improving. °· Only take over-the-counter or prescription medicines for pain, discomfort, or fever as directed by your health care provider. Depending on the type of lithotripsy, you may need to take antibiotics and anti-inflammatory medicines for a few days. °· Drink enough water and fluids to keep your urine clear or pale yellow. This helps "flush" your kidneys. It helps pass any remaining pieces of stone and prevents stones from coming back. °· Most people can resume daily activities within 1-2 days after standard lithotripsy. It can take longer to recover from laser and percutaneous lithotripsy. °· Strain all urine through the provided strainer. Keep all particulate matter and stones for your health care provider to see. The stone may be as small as a grain of salt. It is very important to use the strainer each and every time you pass your urine. Any stones that are found can be sent to a medical lab for examination. °· Visit your health care provider for a follow-up appointment in a few weeks. Your doctor may remove your stent if you have one. Your health care provider will also check to see whether stone particles still remain. °SEEK MEDICAL CARE IF:  °· Your pain is not relieved by medicine. °· You have a lasting nauseous feeling. °· You feel there is too much blood in the urine. °· You develop persistent problems with frequent or painful urination that does not at least partially improve after 2 days following the procedure. °· You have a congested cough. °· You feel   lightheaded. °· You develop a rash or any other signs that might suggest an allergic problem. °· You develop any reaction or side effects to your medicine(s). °SEEK IMMEDIATE MEDICAL CARE IF:  °· You experience severe back or flank pain or both. °· You see nothing but blood when you urinate. °· You cannot pass any urine at all. °· You have a fever or shaking chills. °· You develop shortness of breath, difficulty breathing, or chest pain. °· You  develop vomiting that will not stop after 6-8 hours. °· You have a fainting episode. °This information is not intended to replace advice given to you by your health care provider. Make sure you discuss any questions you have with your health care provider. °Document Released: 10/29/2007 Document Revised: 06/30/2015 Document Reviewed: 04/24/2013 °Elsevier Interactive Patient Education © 2017 [employer] ° °

## 2016-09-07 NOTE — Telephone Encounter (Signed)
Spoke to patient verbalized understanding that her pelvic ultrasound was due to an ovarian cyst which was identified on her renal study per Adaline SillJulia Kordsmeir FNP.

## 2016-09-07 NOTE — Interval H&P Note (Signed)
History and Physical Interval Note: No change in stone.  09/07/2016 11:19 AM  Terri BlackerSylvia Ann Richardson  has presented today for surgery, with the diagnosis of LEFT PROXIMAL URETERAL CALCULUS  The various methods of treatment have been discussed with the patient and family. After consideration of risks, benefits and other options for treatment, the patient has consented to  Procedure(s): LEFT EXTRACORPOREAL SHOCK WAVE LITHOTRIPSY (ESWL) (Left) as a surgical intervention .  The patient's history has been reviewed, patient examined, no change in status, stable for surgery.  I have reviewed the patient's chart and labs.  Questions were answered to the patient's satisfaction.     Decarlo Rivet J

## 2016-09-12 ENCOUNTER — Other Ambulatory Visit: Payer: Self-pay | Admitting: Family Medicine

## 2016-09-12 DIAGNOSIS — I10 Essential (primary) hypertension: Secondary | ICD-10-CM

## 2016-09-28 ENCOUNTER — Ambulatory Visit (INDEPENDENT_AMBULATORY_CARE_PROVIDER_SITE_OTHER): Payer: Managed Care, Other (non HMO) | Admitting: Family Medicine

## 2016-09-28 ENCOUNTER — Encounter: Payer: Self-pay | Admitting: Family Medicine

## 2016-09-28 VITALS — BP 128/90 | HR 89 | Temp 98.3°F | Wt 211.0 lb

## 2016-09-28 DIAGNOSIS — I10 Essential (primary) hypertension: Secondary | ICD-10-CM

## 2016-09-28 DIAGNOSIS — F32A Depression, unspecified: Secondary | ICD-10-CM

## 2016-09-28 DIAGNOSIS — J449 Chronic obstructive pulmonary disease, unspecified: Secondary | ICD-10-CM

## 2016-09-28 DIAGNOSIS — F329 Major depressive disorder, single episode, unspecified: Secondary | ICD-10-CM | POA: Diagnosis not present

## 2016-09-28 MED ORDER — PREDNISONE 10 MG PO TABS: ORAL_TABLET | ORAL | 0 refills | Status: DC

## 2016-09-28 MED ORDER — SERTRALINE HCL 50 MG PO TABS: 50.0000 mg | ORAL_TABLET | Freq: Every day | ORAL | 3 refills | Status: DC

## 2016-09-28 NOTE — Progress Notes (Signed)
Pre visit review using our clinic review tool, if applicable. No additional management support is needed unless otherwise documented below in the visit note. 

## 2016-09-28 NOTE — Patient Instructions (Signed)
Please follow up in one month for evaluation of new medication sertraline. Also, please follow up with Dr. Sherene SiresWert for your asthmatic bronchitis.  [facility] provides counseling and is a great option to add with medication.

## 2016-09-28 NOTE — Progress Notes (Signed)
Subjective:    Patient ID: Terri Richardson, female    DOB: 06-Jul-1971, 45 y.o.   MRN: [id number]  HPI  Terri Richardson is a 45 year old who is here today for follow up of her medical conditions and evaluation for new onset symptoms of depression.  Asthmatic bronchitis:  Followed by pulmonology. She reports cough that is productive with yellow sputum but is scant in nature. Cough will worsen at times and this may be associated during work. She works at [employer] and works outside frequently. Associated symptoms of rhinitis with clear drainage and itchy/watery eyes are present She denies fever, chills, sweats, sinus pressure/pain, ear pain, tooth pain, nasal congestion, and post nasal drip. Use of albuterol one day this week which has provided excellent benefit.  HTN:  BP is controlled today.  She reports monitoring her BP at home and states that systolic ranges of 120s and diastolic ranges in the 80s which does not exceed 90 at home.  She denies chest pain, palpitations, SOB, numbness, tingling, weakness, edema, headaches, and nosebleeds.  Depression: She presents with her husband today for concern of decrease in interest or pleasure in her life which has been going on for at least one month.  Associated difficulty concentrating, and sleep disturbance are noted. She denies suicidal/homicidal ideation.  Reviewed PHQ-2 questions with patient and she scored 5 indicating mild depression  Review of Systems  Constitutional: Negative for chills and fever.  HENT: Positive for rhinorrhea. Negative for congestion, postnasal drip, sinus pain, sinus pressure, sneezing and sore throat.   Eyes: Positive for itching.  Respiratory: Positive for cough. Negative for shortness of breath and wheezing.   Cardiovascular: Negative for chest pain and palpitations.  Gastrointestinal: Negative for abdominal pain, constipation, diarrhea, nausea and vomiting.  Musculoskeletal: Negative for myalgias.  Skin: Negative for  rash.  Neurological: Negative for dizziness, weakness, light-headedness and headaches.  Psychiatric/Behavioral: Positive for decreased concentration and sleep disturbance. Negative for suicidal ideas.   Past Medical History:  Diagnosis Date  . Diabetes mellitus 2006     Social History   Social History  . Marital status: Married    Spouse name: N/A  . Number of children: N/A  . Years of education: N/A   Occupational History  . Works at [employer]    Social History Main Topics  . Smoking status: Former Smoker    Packs/day: 0.50    Years: 4.00    Types: Cigarettes    Quit date: 02/20/2014  . Smokeless tobacco: Never Used  . Alcohol use No  . Drug use: No  . Sexual activity: Yes    Birth control/ protection: None   Other Topics Concern  . Not on file   Social History Narrative  . No narrative on file    Past Surgical History:  Procedure Laterality Date  . APPENDECTOMY  1st grade  . CESAREAN SECTION  2008  . CHOLECYSTECTOMY  11/11/2011   Procedure: LAPAROSCOPIC CHOLECYSTECTOMY WITH INTRAOPERATIVE CHOLANGIOGRAM;  Surgeon: Jetty Duhamel, MD;  Location: [facility] OR;  Service: General;  Laterality: N/A;  . TONSILLECTOMY  6th grade    Family History  Problem Relation Age of Onset  . Hyperlipidemia Mother   . Hypertension Father   . Tongue cancer Maternal Grandfather   . Prostate cancer Paternal Uncle   . Kidney cancer Maternal Uncle   . Heart disease Brother   . Hypertension Brother     No Known Allergies  Current Outpatient Prescriptions on File  Prior to Visit  Medication Sig Dispense Refill  . albuterol (PROVENTIL HFA;VENTOLIN HFA) 108 (90 Base) MCG/ACT inhaler Inhale 2 puffs into the lungs every 6 (six) hours as needed for wheezing or shortness of breath. 1 Inhaler 0  . ibuprofen (ADVIL,MOTRIN) 600 MG tablet Take 1 tablet (600 mg total) by mouth every 6 (six) hours as needed. 30 tablet 0  . losartan (COZAAR) 100 MG tablet TAKE 1 TABLET BY MOUTH EVERY DAY 90 tablet  1  . omeprazole (PRILOSEC) 20 MG capsule Take 1 capsule (20 mg total) by mouth daily. 90 capsule 3  . SYMBICORT 160-4.5 MCG/ACT inhaler Inhale 2 puffs into the lungs 2 (two) times daily.  11  . amLODipine (NORVASC) 5 MG tablet TAKE 1 TABLET BY MOUTH EVERY DAY 90 tablet 1  . HYDROcodone-acetaminophen (NORCO) 10-325 MG tablet Take 1 tablet by mouth every 6 (six) hours as needed for moderate pain or severe pain. (Patient not taking: Reported on 09/28/2016) 20 tablet 0  . ondansetron (ZOFRAN ODT) 8 MG disintegrating tablet Take 1 tablet (8 mg total) by mouth every 8 (eight) hours as needed for nausea or vomiting. (Patient not taking: Reported on 09/28/2016) 20 tablet 0   No current facility-administered medications on file prior to visit.     BP 128/90 (BP Location: Right Arm, Patient Position: Sitting, Cuff Size: Normal)   Pulse 89   Temp 98.3 F (36.8 C) (Oral)   Wt 211 lb (95.7 kg)   LMP 09/01/2016 Comment: Neg u preg 08/31/16/ preg test waiver signed 09/07/16   SpO2 96%   BMI 38.59 kg/m       Objective:   Physical Exam  Constitutional: She is oriented to person, place, and time. She appears well-developed and well-nourished.  Eyes: Pupils are equal, round, and reactive to light. No scleral icterus.  Neck: Neck supple.  Cardiovascular: Normal rate, regular rhythm and intact distal pulses.   Pulmonary/Chest: Effort normal and breath sounds normal.  Scattered wheezing present  Abdominal: Soft. Bowel sounds are normal.  Lymphadenopathy:    She has no cervical adenopathy.  Neurological: She is alert and oriented to person, place, and time. Coordination normal.  Skin: Skin is warm and dry. No rash noted.  Psychiatric: Judgment and thought content normal.  Patient did not smile during exam and history        Assessment & Plan:  1. Depression, unspecified depression type Exam and history support trial of sertraline with recommendation for counseling. Patient is motivated to go to  counseling and try this approach.  We discussed medication, adverse effects, and need for follow up for evaluation of therapeutic dose. Follow up in 1 month or sooner if needed. - sertraline (ZOLOFT) 50 MG tablet; Take 1 tablet (50 mg total) by mouth daily.  Dispense: 30 tablet; Refill: 3  2. Asthmatic bronchitis , chronic (HCC) Scattered wheezing present otherwise exam is unremarkable. Will provide taper of prednisone today as patient is due to a follow up with pulmonology within the next week. Continue Symbicort.  Advised patient to follow up if symptoms do not improve with treatment, worsen, or she develops a fever or is using her rescue inhaler more than twice weekly. - predniSONE (DELTASONE) 10 MG tablet; Take 4 tablets daily for 4 days, 3 tabs daily for 2 days, 2 tabs daily for 2 days, and one tab daily for 2 days.  Dispense: 28 tablet; Refill: 0  3. Essential hypertension Controlled; Continue losartan and amlodipine. Advised monitoring of blood pressure at  least one to two times/week.   Information for counseling provided; follow up in one month for evaluation of initiation of sertraline.  Roddie McJulia Sandara Tyree, FNP-C

## 2016-10-25 ENCOUNTER — Telehealth: Payer: Self-pay | Admitting: Family Medicine

## 2016-10-25 NOTE — Telephone Encounter (Signed)
Pt need new Rx for sertraline pt would like to have her dosage upped due to the one that she is on is not working for her.  Pharm:  [facility] [address]

## 2016-10-26 ENCOUNTER — Ambulatory Visit: Payer: Managed Care, Other (non HMO) | Admitting: Family Medicine

## 2016-10-26 ENCOUNTER — Other Ambulatory Visit: Payer: Self-pay

## 2016-10-26 DIAGNOSIS — F329 Major depressive disorder, single episode, unspecified: Secondary | ICD-10-CM

## 2016-10-26 DIAGNOSIS — F32A Depression, unspecified: Secondary | ICD-10-CM

## 2016-10-26 MED ORDER — SERTRALINE HCL 100 MG PO TABS: 100.0000 mg | ORAL_TABLET | Freq: Every day | ORAL | 1 refills | Status: DC

## 2016-10-26 NOTE — Telephone Encounter (Signed)
Called in Sertraline 100 mg to the [facility] on [address], Left a message for the patient to call the clinic for more information on the changes made on the medication.

## 2016-10-26 NOTE — Telephone Encounter (Signed)
Terri Richardson pt returned your call °

## 2016-10-26 NOTE — Telephone Encounter (Signed)
Spoke to patient verbalized understanding that a new Rx for setraline100mg  had been sent to the pharmacy per Amil AmenJulia and that patient needed to follow up in 2 weeks for further evaluation for medication adjustment.

## 2016-10-26 NOTE — Telephone Encounter (Signed)
You may increase dose to 100 mg once daily from 50 mg once daily. Follow up in 2 weeks for further evaluation of medication dosage adjustment or sooner if needed for further evaluation and treatment.

## 2016-10-27 ENCOUNTER — Other Ambulatory Visit: Payer: Managed Care, Other (non HMO)

## 2016-10-27 ENCOUNTER — Ambulatory Visit
Admission: RE | Admit: 2016-10-27 | Discharge: 2016-10-27 | Disposition: A | Discharge status: Home / Self Care | Payer: Managed Care, Other (non HMO) | Source: Ambulatory Visit | Attending: Family Medicine | Admitting: Family Medicine

## 2016-10-27 DIAGNOSIS — N83202 Unspecified ovarian cyst, left side: Secondary | ICD-10-CM

## 2016-10-27 IMAGING — US US PELVIS COMPLETE
1 series · 13 of 25 positions shown · non-contrast
Comparison: CT abdomen and pelvis [DATE]

CLINICAL DATA: Abnormal CT, LEFT ovarian cyst, history diabetes
mellitus



[Series 1: us pelvis complete · 0.35mm/px · 13 of 54 slices shown]
[im 1/54]
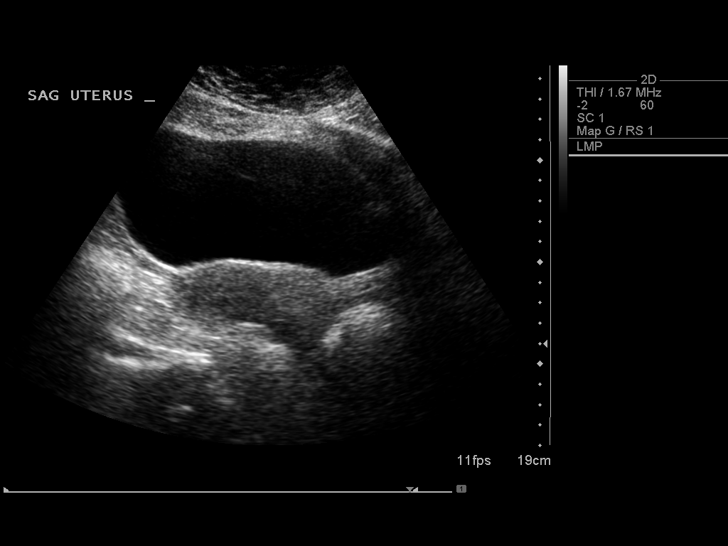
[im 5/54]
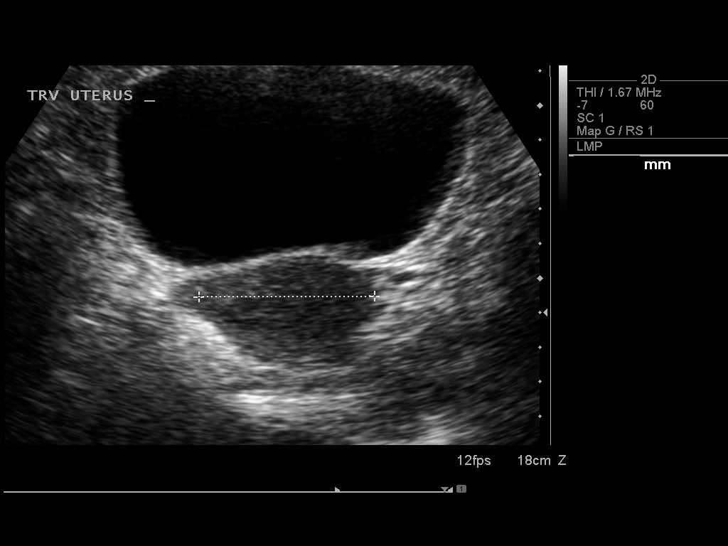
[im 9/54]
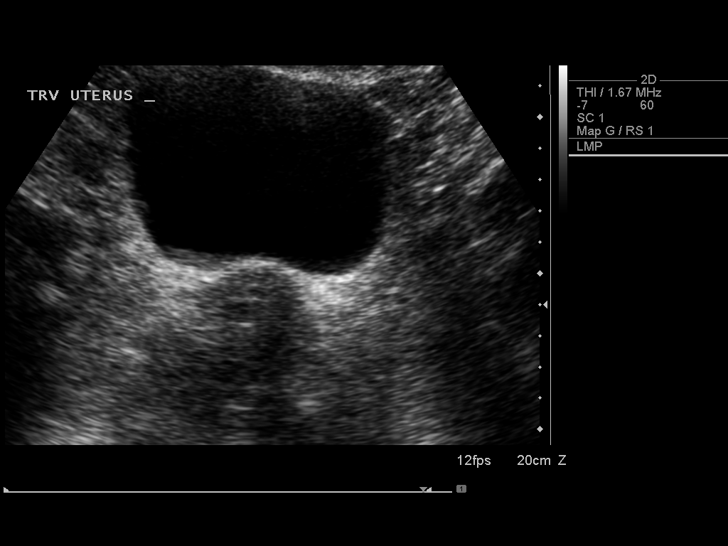
[im 14/54]
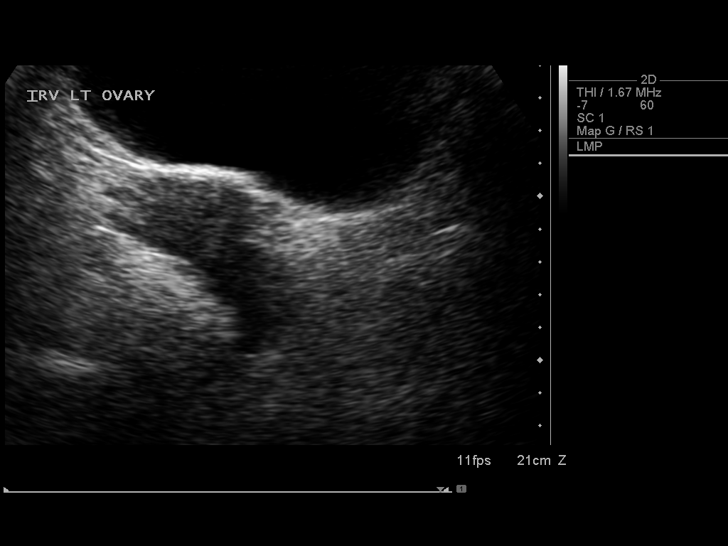
[im 18/54]
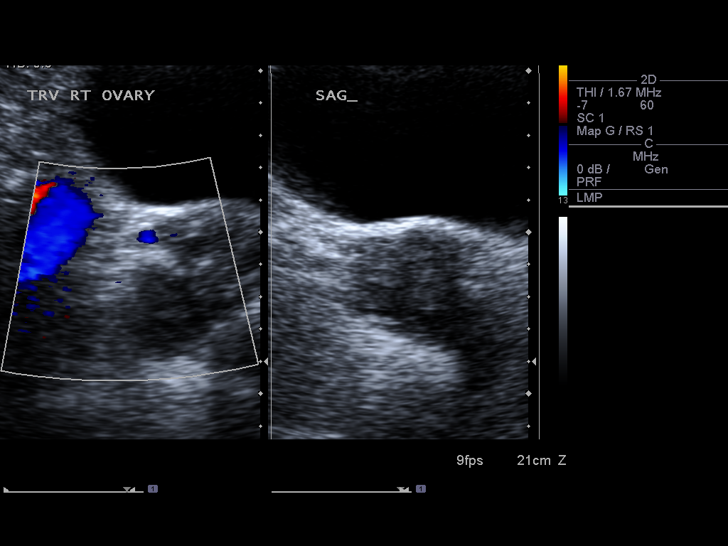
[im 23/54]
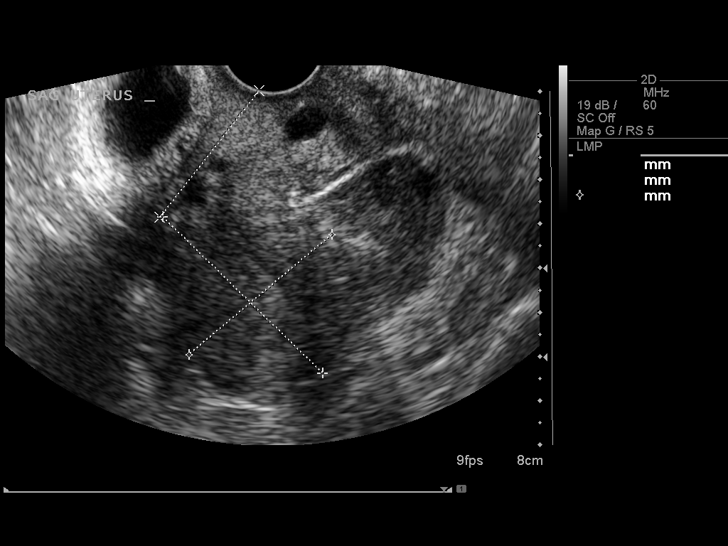
[im 27/54]
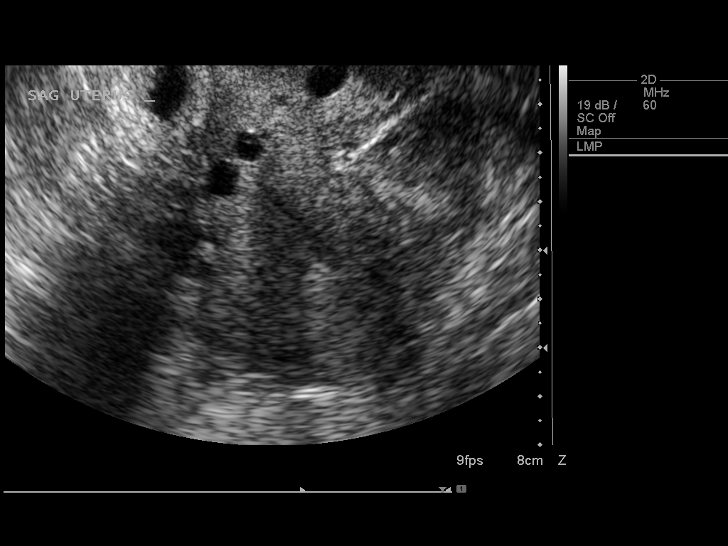
[im 31/54]
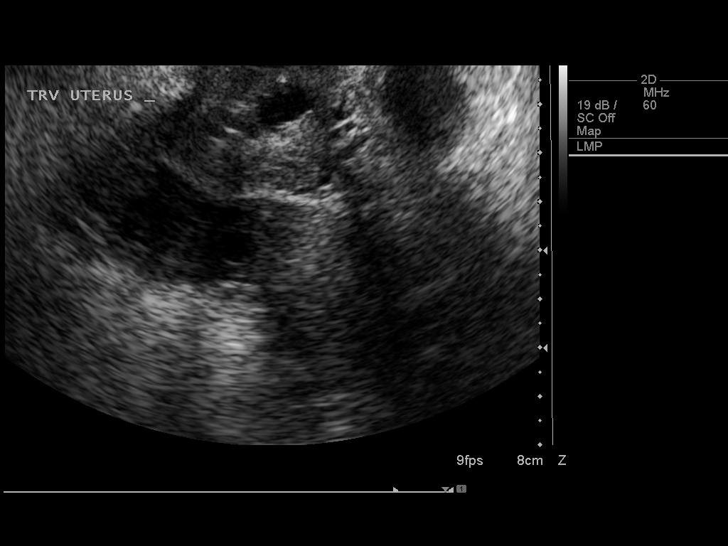
[im 36/54]
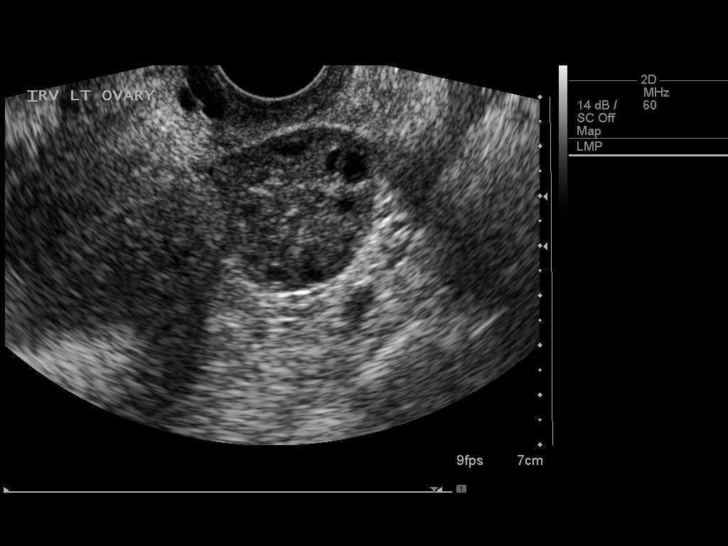
[im 40/54]
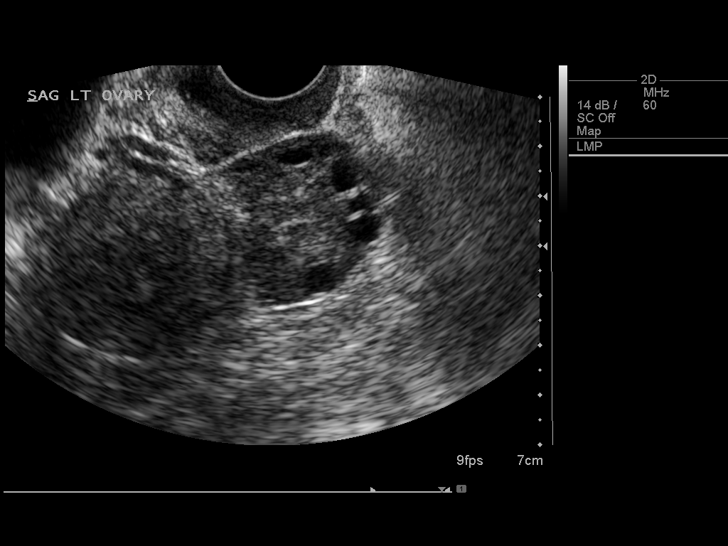
[im 45/54]
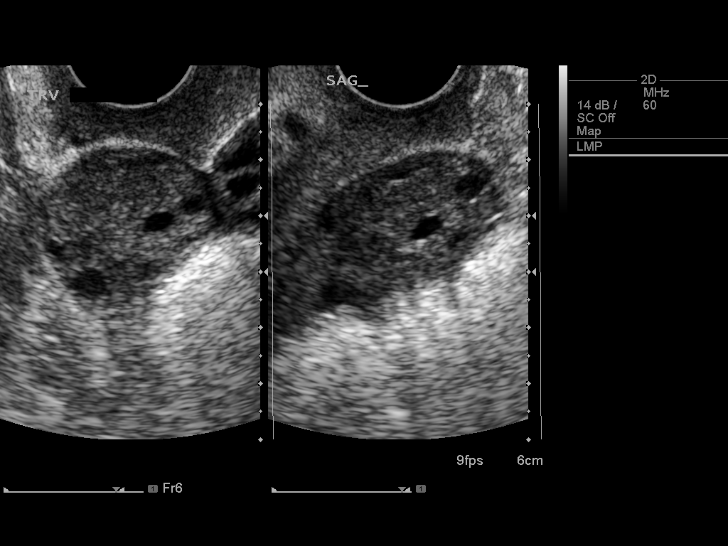
[im 49/54]
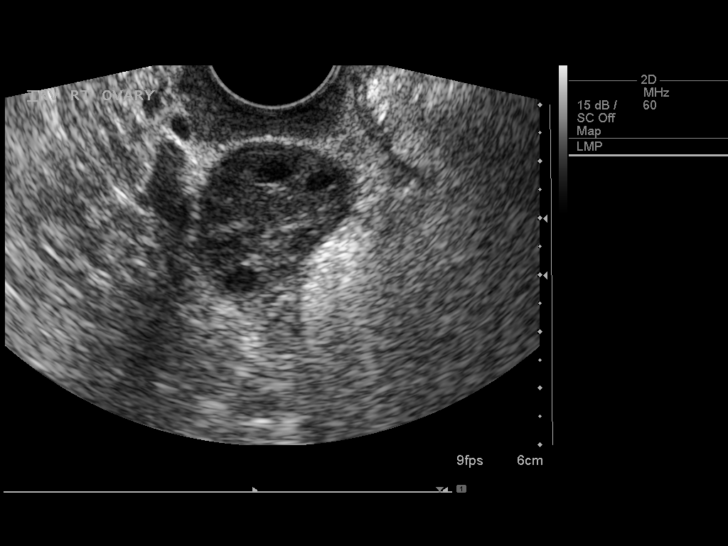
[im 54/54]
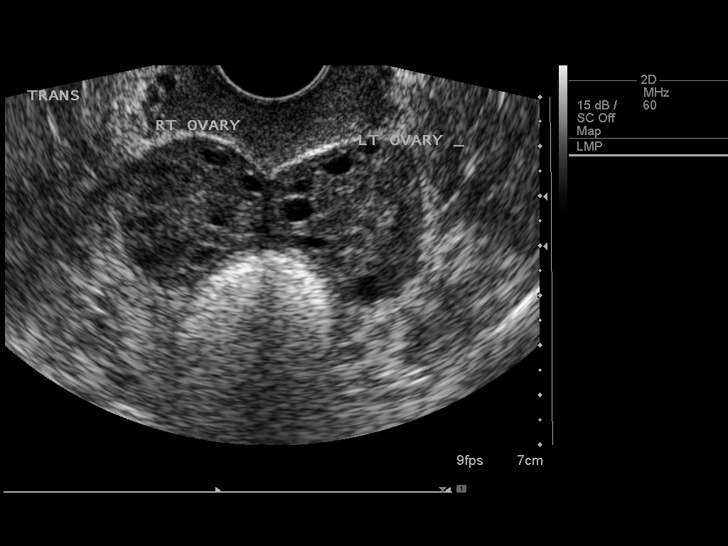

[13 of 25 positions shown; findings below may reference images not displayed]

FINDINGS: Uterus

Measurements: 8.2 x 3.6 x 5.1 cm. Retroflexed. No focal uterine
mass. Nabothian cysts at cervix.

Endometrium

Thickness: 4 mm thick, normal. No endometrial fluid or focal
abnormalities

Right ovary

Measurements: 3.7 x 3.0 x 3.2 cm. Normal morphology without mass.
Internal blood flow present on color Doppler imaging.

Left ovary

Measurements: 3.7 x 3.4 x 3.2 cm. Normal morphology without mass.
Internal blood flow present on color Doppler imaging.

Other findings

No free pelvic fluid or adnexal masses.

Ovaries are adjacent to each other in the cul-de-sac and normal in
appearance.

Cystic structure seen on the previous CT exam is not identified.
IMPRESSION: Normal pelvic ultrasound.

Cystic mass identified on the prior CT exam has resolved.

## 2016-11-06 ENCOUNTER — Encounter: Payer: Self-pay | Admitting: Family Medicine

## 2016-11-06 ENCOUNTER — Ambulatory Visit (INDEPENDENT_AMBULATORY_CARE_PROVIDER_SITE_OTHER): Payer: Managed Care, Other (non HMO) | Admitting: Family Medicine

## 2016-11-06 VITALS — BP 128/90 | HR 70 | Temp 98.4°F | Wt 210.4 lb

## 2016-11-06 DIAGNOSIS — I10 Essential (primary) hypertension: Secondary | ICD-10-CM | POA: Diagnosis not present

## 2016-11-06 DIAGNOSIS — F32A Depression, unspecified: Secondary | ICD-10-CM

## 2016-11-06 DIAGNOSIS — F329 Major depressive disorder, single episode, unspecified: Secondary | ICD-10-CM

## 2016-11-06 NOTE — Patient Instructions (Signed)
It was a pleasure seeing you today!  Please continue sertraline as currently prescribed and we can follow up in 6 months for evaluation or sooner if needed.  Also, please continue to check blood pressure at home and report elevated readings. Recommend low salt diet. See dietary recommendations below.DASH Eating Plan DASH stands for "Dietary Approaches to Stop Hypertension." The DASH eating plan is a healthy eating plan that has been shown to reduce high blood pressure (hypertension). Additional health benefits may include reducing the risk of type 2 diabetes mellitus, heart disease, and stroke. The DASH eating plan may also help with weight loss. What do I need to know about the DASH eating plan? For the DASH eating plan, you will follow these general guidelines:  Choose foods with less than 150 milligrams of sodium per serving (as listed on the food label).  Use salt-free seasonings or herbs instead of table salt or sea salt.  Check with your health care provider or pharmacist before using salt substitutes.  Eat lower-sodium products. These are often labeled as "low-sodium" or "no salt added."  Eat fresh foods. Avoid eating a lot of canned foods.  Eat more vegetables, fruits, and low-fat dairy products.  Choose whole grains. Look for the word "whole" as the first word in the ingredient list.  Choose fish and skinless chicken or [place] more often than red meat. Limit fish, poultry, and meat to 6 oz (170 g) each day.  Limit sweets, desserts, sugars, and sugary drinks.  Choose heart-healthy fats.  Eat more home-cooked food and less restaurant, buffet, and fast food.  Limit fried foods.  Do not fry foods. Cook foods using methods such as baking, boiling, grilling, and broiling instead.  When eating at a restaurant, ask that your food be prepared with less salt, or no salt if possible. What foods can I eat? Seek help from a dietitian for individual calorie needs. Grains  Whole  grain or whole wheat bread. Brown rice. Whole grain or whole wheat pasta. Quinoa, bulgur, and whole grain cereals. Low-sodium cereals. Corn or whole wheat flour tortillas. Whole grain cornbread. Whole grain crackers. Low-sodium crackers. Vegetables  Fresh or frozen vegetables (raw, steamed, roasted, or grilled). Low-sodium or reduced-sodium tomato and vegetable juices. Low-sodium or reduced-sodium tomato sauce and paste. Low-sodium or reduced-sodium canned vegetables. Fruits  All fresh, canned (in natural juice), or frozen fruits. Meat and Other Protein Products  Ground beef (85% or leaner), grass-fed beef, or beef trimmed of fat. Skinless chicken or [place]. Ground chicken or [place]. Pork trimmed of fat. All fish and seafood. Eggs. Dried beans, peas, or lentils. Unsalted nuts and seeds. Unsalted canned beans. Dairy  Low-fat dairy products, such as skim or 1% milk, 2% or reduced-fat cheeses, low-fat ricotta or cottage cheese, or plain low-fat yogurt. Low-sodium or reduced-sodium cheeses. Fats and Oils  Tub margarines without trans fats. Light or reduced-fat mayonnaise and salad dressings (reduced sodium). Avocado. Safflower, olive, or canola oils. Natural peanut or almond butter. Other  Unsalted popcorn and pretzels. The items listed above may not be a complete list of recommended foods or beverages. Contact your dietitian for more options.  What foods are not recommended? Grains  White bread. White pasta. White rice. Refined cornbread. Bagels and croissants. Crackers that contain trans fat. Vegetables  Creamed or fried vegetables. Vegetables in a cheese sauce. Regular canned vegetables. Regular canned tomato sauce and paste. Regular tomato and vegetable juices. Fruits  Canned fruit in light or heavy syrup. Fruit juice. Meat and  Other Protein Products  Fatty cuts of meat. Ribs, chicken wings, bacon, sausage, bologna, salami, chitterlings, fatback, hot dogs, bratwurst, and packaged luncheon  meats. Salted nuts and seeds. Canned beans with salt. Dairy  Whole or 2% milk, cream, half-and-half, and cream cheese. Whole-fat or sweetened yogurt. Full-fat cheeses or blue cheese. Nondairy creamers and whipped toppings. Processed cheese, cheese spreads, or cheese curds. Condiments  Onion and garlic salt, seasoned salt, table salt, and sea salt. Canned and packaged gravies. Worcestershire sauce. Tartar sauce. Barbecue sauce. Teriyaki sauce. Soy sauce, including reduced sodium. Steak sauce. Fish sauce. Oyster sauce. Cocktail sauce. Horseradish. Ketchup and mustard. Meat flavorings and tenderizers. Bouillon cubes. Hot sauce. Tabasco sauce. Marinades. Taco seasonings. Relishes. Fats and Oils  Butter, stick margarine, lard, shortening, ghee, and bacon fat. Coconut, palm kernel, or palm oils. Regular salad dressings. Other  Pickles and olives. Salted popcorn and pretzels. The items listed above may not be a complete list of foods and beverages to avoid. Contact your dietitian for more information.  Where can I find more information? National Heart, Lung, and Blood Institute: CablePromo.itwww.nhlbi.nih.gov/health/health-topics/topics/dash/ This information is not intended to replace advice given to you by your health care provider. Make sure you discuss any questions you have with your health care provider. Document Released: 09/28/2011 Document Revised: 03/16/2016 Document Reviewed: 08/13/2013 Elsevier Interactive Patient Education  2017 Elsevier Inc.   Minimal Blood Pressure Goal= AVERAGE < 140/90; Ideal is an AVERAGE < 135/85. This AVERAGE should be calculated from @ least 5-7 BP readings taken @ different times of day on different days of week. You should not respond to isolated BP readings , but rather the AVERAGE for that week .Please bring your blood pressure cuff to office visits to verify that it is reliable.It can also be checked against the blood pressure device at the pharmacy. Finger or wrist cuffs  are not dependable; an arm cuff is.

## 2016-11-06 NOTE — Progress Notes (Signed)
Pre visit review using our clinic review tool, if applicable. No additional management support is needed unless otherwise documented below in the visit note. 

## 2016-11-06 NOTE — Progress Notes (Signed)
Subjective:    Patient ID: Terri Richardson, female    DOB: 22-Dec-1970, 46 y.o.   MRN: [id number]  HPI  Terri Richardson is a 46 year old female pleasant and smiling who presents today for follow up of depression and initiation of sertraline. She reports that this medication has provided excellent benefit.    She reports increased interest with engaging in activities with husband and child. Associated improvement in concentration and sleeping are noted. She denies suicidal/homicidal ideation. She denies adverse effects with sertraline and reports that she takes it in the evening.  HTN: BP is controlled today. She is monitoring her BP at home and states that she has systolic ranges in the 120s or low 130s and diastolic ranges in the 80s but states that she has not noted a diastolic >90. She denies chest pain, palpitations, SOB, numbness, tingling, weakness, edema, headaches, and nosebleeds.   Review of Systems  Constitutional: Negative for chills, fatigue and fever.  Respiratory: Negative for apnea, cough, shortness of breath and wheezing.   Cardiovascular: Negative for chest pain, palpitations and leg swelling.  Gastrointestinal: Negative for abdominal pain, constipation, diarrhea, nausea and vomiting.  Skin: Negative for pallor and rash.  Neurological: Negative for dizziness, light-headedness, numbness and headaches.  Psychiatric/Behavioral: Negative for decreased concentration and sleep disturbance. The patient is not nervous/anxious.        Denies depressed or anxious mood today   Past Medical History:  Diagnosis Date  . Diabetes mellitus 2006     Social History   Social History  . Marital status: Married    Spouse name: N/A  . Number of children: N/A  . Years of education: N/A   Occupational History  . Works at [employer]    Social History Main Topics  . Smoking status: Former Smoker    Packs/day: 0.50    Years: 4.00    Types: Cigarettes    Quit date: 02/20/2014  . Smokeless  tobacco: Never Used  . Alcohol use No  . Drug use: No  . Sexual activity: Yes    Birth control/ protection: None   Other Topics Concern  . Not on file   Social History Narrative  . No narrative on file    Past Surgical History:  Procedure Laterality Date  . APPENDECTOMY  1st grade  . CESAREAN SECTION  2008  . CHOLECYSTECTOMY  11/11/2011   Procedure: LAPAROSCOPIC CHOLECYSTECTOMY WITH INTRAOPERATIVE CHOLANGIOGRAM;  Surgeon: Jetty Duhamel, MD;  Location: [facility] OR;  Service: General;  Laterality: N/A;  . TONSILLECTOMY  6th grade    Family History  Problem Relation Age of Onset  . Hyperlipidemia Mother   . Hypertension Father   . Tongue cancer Maternal Grandfather   . Prostate cancer Paternal Uncle   . Kidney cancer Maternal Uncle   . Heart disease Brother   . Hypertension Brother     No Known Allergies  Current Outpatient Prescriptions on File Prior to Visit  Medication Sig Dispense Refill  . albuterol (PROVENTIL HFA;VENTOLIN HFA) 108 (90 Base) MCG/ACT inhaler Inhale 2 puffs into the lungs every 6 (six) hours as needed for wheezing or shortness of breath. 1 Inhaler 0  . amLODipine (NORVASC) 5 MG tablet TAKE 1 TABLET BY MOUTH EVERY DAY 90 tablet 1  . ibuprofen (ADVIL,MOTRIN) 600 MG tablet Take 1 tablet (600 mg total) by mouth every 6 (six) hours as needed. 30 tablet 0  . losartan (COZAAR) 100 MG tablet TAKE 1 TABLET BY MOUTH EVERY  DAY 90 tablet 1  . omeprazole (PRILOSEC) 20 MG capsule Take 1 capsule (20 mg total) by mouth daily. 90 capsule 3  . sertraline (ZOLOFT) 100 MG tablet Take 1 tablet (100 mg total) by mouth daily. 30 tablet 1  . SYMBICORT 160-4.5 MCG/ACT inhaler Inhale 2 puffs into the lungs 2 (two) times daily.  11   No current facility-administered medications on file prior to visit.     BP 128/90 (BP Location: Left Arm, Patient Position: Sitting, Cuff Size: Normal)   Pulse 70   Temp 98.4 F (36.9 C) (Oral)   Wt 210 lb 6.4 oz (95.4 kg)   SpO2 98%   BMI 38.48  kg/m        Objective:   Physical Exam  Constitutional: She is oriented to person, place, and time. She appears well-developed and well-nourished.  Eyes: Pupils are equal, round, and reactive to light. No scleral icterus.  Neck: Neck supple.  Cardiovascular: Normal rate and regular rhythm.   Pulmonary/Chest: Effort normal and breath sounds normal. She has no wheezes.  Abdominal: Soft. Bowel sounds are normal. There is no tenderness.  Lymphadenopathy:    She has no cervical adenopathy.  Neurological: She is alert and oriented to person, place, and time.  Skin: Skin is warm and dry. No rash noted.  Psychiatric: She has a normal mood and affect. Her behavior is normal. Judgment and thought content normal.          Assessment & Plan:  1. Depression, unspecified depression type Improved since last visit. She reports >50% improvement since increasing dose to 100 mg two weeks ago. Continue sertraline at 100 mg daily and follow up in 6 months or sooner if needed.   2. Essential hypertension Controlled continue losartan and amlodipine. Advised continued monitoring at least one to two times/week. Provided DASH diet recommendations.  Terri McJulia Cheyna Retana, FNP-C

## 2016-11-07 ENCOUNTER — Telehealth: Payer: Self-pay | Admitting: Internal Medicine

## 2016-11-07 NOTE — Telephone Encounter (Signed)
[email or web address] x1 

## 2016-11-10 NOTE — Telephone Encounter (Signed)
LMTCB

## 2016-11-13 NOTE — Telephone Encounter (Signed)
Spoke with pt and made her aware that a symbicort coupon has been placed up front for pick up. Pt voiced her understanding and had no further questions. Nothing further needed.

## 2016-11-13 NOTE — Telephone Encounter (Signed)
[email or web address] x 3  

## 2016-12-14 ENCOUNTER — Other Ambulatory Visit: Payer: Self-pay

## 2016-12-14 DIAGNOSIS — F329 Major depressive disorder, single episode, unspecified: Secondary | ICD-10-CM

## 2016-12-14 DIAGNOSIS — F32A Depression, unspecified: Secondary | ICD-10-CM

## 2016-12-14 MED ORDER — SERTRALINE HCL 100 MG PO TABS: 100.0000 mg | ORAL_TABLET | Freq: Every day | ORAL | 1 refills | Status: DC

## 2016-12-15 ENCOUNTER — Other Ambulatory Visit: Payer: Self-pay | Admitting: Family Medicine

## 2016-12-15 DIAGNOSIS — K219 Gastro-esophageal reflux disease without esophagitis: Secondary | ICD-10-CM

## 2016-12-22 ENCOUNTER — Other Ambulatory Visit: Payer: Self-pay | Admitting: Family Medicine

## 2016-12-22 DIAGNOSIS — F329 Major depressive disorder, single episode, unspecified: Secondary | ICD-10-CM

## 2016-12-22 DIAGNOSIS — F32A Depression, unspecified: Secondary | ICD-10-CM

## 2016-12-22 NOTE — Telephone Encounter (Signed)
This Rx was filled on 12/14/2016. Confirmed with the pharmacy.

## 2016-12-27 ENCOUNTER — Other Ambulatory Visit: Payer: Self-pay | Admitting: Family Medicine

## 2016-12-27 ENCOUNTER — Telehealth: Payer: Self-pay | Admitting: Family Medicine

## 2016-12-27 DIAGNOSIS — F32A Depression, unspecified: Secondary | ICD-10-CM

## 2016-12-27 DIAGNOSIS — F329 Major depressive disorder, single episode, unspecified: Secondary | ICD-10-CM

## 2016-12-27 NOTE — Telephone Encounter (Signed)
Pt need new Rx for sertraline #90  Pharm:  [facility]

## 2016-12-28 ENCOUNTER — Ambulatory Visit (INDEPENDENT_AMBULATORY_CARE_PROVIDER_SITE_OTHER): Payer: 59 | Admitting: Family Medicine

## 2016-12-28 ENCOUNTER — Encounter: Payer: Self-pay | Admitting: Family Medicine

## 2016-12-28 VITALS — BP 126/82 | HR 98 | Temp 97.6°F | Wt 205.6 lb

## 2016-12-28 DIAGNOSIS — F329 Major depressive disorder, single episode, unspecified: Secondary | ICD-10-CM

## 2016-12-28 DIAGNOSIS — F32A Depression, unspecified: Secondary | ICD-10-CM

## 2016-12-28 DIAGNOSIS — J449 Chronic obstructive pulmonary disease, unspecified: Secondary | ICD-10-CM

## 2016-12-28 DIAGNOSIS — J0100 Acute maxillary sinusitis, unspecified: Secondary | ICD-10-CM

## 2016-12-28 MED ORDER — PREDNISONE 10 MG PO TABS: ORAL_TABLET | ORAL | 0 refills | Status: DC

## 2016-12-28 MED ORDER — AMOXICILLIN-POT CLAVULANATE 875-125 MG PO TABS: 1.0000 | ORAL_TABLET | Freq: Two times a day (BID) | ORAL | 0 refills | Status: DC

## 2016-12-28 MED ORDER — SERTRALINE HCL 100 MG PO TABS: 100.0000 mg | ORAL_TABLET | Freq: Every day | ORAL | 1 refills | Status: DC

## 2016-12-28 MED ORDER — ALBUTEROL SULFATE (2.5 MG/3ML) 0.083% IN NEBU
2.5000 mg | INHALATION_SOLUTION | Freq: Once | RESPIRATORY_TRACT | Status: AC
  Administered 2016-12-28: 2.5 mg via RESPIRATORY_TRACT

## 2016-12-28 NOTE — Progress Notes (Signed)
Subjective:    Patient ID: Terri BlackerSylvia Ann Richardson, female    DOB: 1971/05/26, 46 y.o.   MRN: [id number]  HPI  [name] is a 46 year old female who presents today with wheezing that has been present for one week. Associated nasal congestion, sinus pressure/pain, rhinitis with clear drainage for 7 weeks prior to wheezing starting. She denies fever, chills, sweats, N/V/D, myalgias, tooth pain, and SOB Treatment at home with DayQuil, Nyquil, Mucinex, and Mucinex Allergy and Sinus that provided limited benefit. Symbicort 160-4.5 two puffs BID; rescue inhaler use only 2 times and she states that this occurred only after temperature changes at work. No recent antibiotic therapy; no recent sick contact exposure;  Asthmatic bronchitis history where she has been evaluated and treated by pulmonology.  She has an established plan as follows:  Plan A = Automatic = Symbicort 160 Take 2 puffs first thing in am and then another 2 puffs about 12 hours later.  Plan B = Backup Only use your albuterol (proair )as a rescue medication  Plan C = Crisis - only use your albuterol nebulizer if you first try Plan B and it fails to help > ok to use the nebulizer up to every 4 hours but if start needing it regularly call for immediate appointment  She is adherent with her Symbicort and has not needed her albuterol >2 times during this episode over the past 7 weeks and only needed it at work when she was exposed to significant temperature changes.  Depression: Depressed mood: She reports that 100 mg has improved her symptoms. She feels that she continues to receive benefit from this medication Anhedonia: No Insomnia: No Change in Appetite: No Guilt: No Change in Sexual Function: No Side Effects: No reported adverse effects  Review of Systems  Constitutional: Negative for chills, fatigue and fever.  HENT: Positive for congestion, postnasal drip, rhinorrhea, sinus pain and sinus pressure. Negative for ear pain and  sore throat.   Respiratory: Positive for cough and wheezing. Negative for shortness of breath.   Cardiovascular: Negative for chest pain and palpitations.  Gastrointestinal: Negative for abdominal pain, diarrhea, nausea and vomiting.  Musculoskeletal: Negative for myalgias.  Neurological: Negative for dizziness and headaches.  Psychiatric/Behavioral:       Denies depressed or anxious mood today   Past Medical History:  Diagnosis Date  . Diabetes mellitus 2006     Social History   Social History  . Marital status: Married    Spouse name: N/A  . Number of children: N/A  . Years of education: N/A   Occupational History  . Works at [employer]    Social History Main Topics  . Smoking status: Former Smoker    Packs/day: 0.50    Years: 4.00    Types: Cigarettes    Quit date: 02/20/2014  . Smokeless tobacco: Never Used  . Alcohol use No  . Drug use: No  . Sexual activity: Yes    Birth control/ protection: None   Other Topics Concern  . Not on file   Social History Narrative  . No narrative on file    Past Surgical History:  Procedure Laterality Date  . APPENDECTOMY  1st grade  . CESAREAN SECTION  2008  . CHOLECYSTECTOMY  11/11/2011   Procedure: LAPAROSCOPIC CHOLECYSTECTOMY WITH INTRAOPERATIVE CHOLANGIOGRAM;  Surgeon: Jetty DuhamelJames O Wyatt III, MD;  Location: [facility] OR;  Service: General;  Laterality: N/A;  . TONSILLECTOMY  6th grade    Family History  Problem Relation  Age of Onset  . Hyperlipidemia Mother   . Hypertension Father   . Tongue cancer Maternal Grandfather   . Prostate cancer Paternal Uncle   . Kidney cancer Maternal Uncle   . Heart disease Brother   . Hypertension Brother     No Known Allergies  Current Outpatient Prescriptions on File Prior to Visit  Medication Sig Dispense Refill  . albuterol (PROVENTIL HFA;VENTOLIN HFA) 108 (90 Base) MCG/ACT inhaler Inhale 2 puffs into the lungs every 6 (six) hours as needed for wheezing or shortness of breath. 1 Inhaler 0    . amLODipine (NORVASC) 5 MG tablet TAKE 1 TABLET BY MOUTH EVERY DAY 90 tablet 1  . ibuprofen (ADVIL,MOTRIN) 600 MG tablet Take 1 tablet (600 mg total) by mouth every 6 (six) hours as needed. 30 tablet 0  . losartan (COZAAR) 100 MG tablet TAKE 1 TABLET BY MOUTH EVERY DAY 90 tablet 1  . omeprazole (PRILOSEC) 20 MG capsule Take 1 capsule (20 mg total) by mouth daily. 90 capsule 3  . omeprazole (PRILOSEC) 20 MG capsule TAKE ONE CAPSULE BY MOUTH EVERY DAY 90 capsule 1  . SYMBICORT 160-4.5 MCG/ACT inhaler Inhale 2 puffs into the lungs 2 (two) times daily.  11   No current facility-administered medications on file prior to visit.     BP 126/82 (BP Location: Left Arm, Patient Position: Sitting, Cuff Size: Large)   Pulse 98   Temp 97.6 F (36.4 C) (Oral)   Wt 205 lb 9.6 oz (93.3 kg)   SpO2 96%   BMI 37.60 kg/m       Objective:   Physical Exam  Constitutional: She is oriented to person, place, and time. She appears well-developed and well-nourished.  HENT:  Right Ear: Tympanic membrane normal.  Left Ear: Tympanic membrane normal.  Nose: Rhinorrhea present. Right sinus exhibits maxillary sinus tenderness. Right sinus exhibits no frontal sinus tenderness. Left sinus exhibits maxillary sinus tenderness. Left sinus exhibits no frontal sinus tenderness.  Mouth/Throat: Mucous membranes are normal. No oropharyngeal exudate or posterior oropharyngeal erythema.  Eyes: Pupils are equal, round, and reactive to light. No scleral icterus.  Neck: Neck supple.  Cardiovascular: Normal rate and regular rhythm.   Pulmonary/Chest: Effort normal. She has wheezes. She has no rales.  Abdominal: Soft. Bowel sounds are normal.  Lymphadenopathy:    She has cervical adenopathy.  Neurological: She is alert and oriented to person, place, and time. Coordination normal.  Skin: Skin is warm and dry. No rash noted.  Psychiatric: She has a normal mood and affect. Her behavior is normal. Judgment and thought content  normal.       Assessment & Plan:   1. Acute maxillary sinusitis, recurrence not specified Duration of symptoms; no improvement; will treat with Augmentin and advised Advised patient on supportive measures:  Get rest, drink plenty of fluids, and use tylenol as needed. Follow up if fever >101, if symptoms worsen or if symptoms are not improved in 3 to 4 days. Patient verbalizes understanding.  - amoxicillin-clavulanate (AUGMENTIN) 875-125 MG tablet; Take 1 tablet by mouth 2 (two) times daily.  Dispense: 20 tablet; Refill: 0  2. Asthmatic bronchitis , chronic (HCC) In office albuterol nebulizer provided; wheezing improved; RR 16; no SOB reported; treat with prednisone and advised taking medication with food and follow up as indicated above. We discussed the importance of albuterol inhaler being a rescue inhaler and if she is using this >2 times/week, follow up with pulmonology is indicated. She was able to voice  understanding of plan established by pulmonology. - predniSONE (DELTASONE) 10 MG tablet; Take 4 tablets daily for 4 days, 3 tabs daily for 2 days, 2 tabs daily for 2 days, and one tab daily for 2 days.  Dispense: 28 tablet; Refill: 0 - albuterol (PROVENTIL) (2.5 MG/3ML) 0.083% nebulizer solution 2.5 mg; Take 3 mLs (2.5 mg total) by nebulization once.  3. Depression, unspecified depression type Controlled; she requested a 90 day supply for this medication which was provided and will follow up in 2 months as for evaluation of increase in dose of 100 mg that was completed in January 2018. - sertraline (ZOLOFT) 100 MG tablet; Take 1 tablet (100 mg total) by mouth daily.  Dispense: 90 tablet; Refill: 1  Roddie Mc, FNP-C

## 2016-12-28 NOTE — Telephone Encounter (Signed)
Refills for this Rx was send to pharmacy and patient is aware.

## 2016-12-28 NOTE — Progress Notes (Signed)
Pre visit review using our clinic review tool, if applicable. No additional management support is needed unless otherwise documented below in the visit note. 

## 2016-12-28 NOTE — Patient Instructions (Addendum)
It was a pleasure to see you today! Please take medication as directed and follow up if symptoms do not improve with treatment in 3 to 4 days, worsen, or you develop a fever >101. Also, please follow plan established by your lung specialist and continue medications as directed.   We can also follow up regarding sertraline dose in either May or June or sooner if needed.    WE NOW OFFER   [facility] FAST TRACK!!!  SAME DAY Appointments for ACUTE CARE  Such as: Sprains, Injuries, cuts, abrasions, rashes, muscle pain, joint pain, back pain Colds, flu, sore throats, headache, allergies, cough, fever  Ear pain, sinus and eye infections Abdominal pain, nausea, vomiting, diarrhea, upset stomach Animal/insect bites  3 Easy Ways to Schedule: Walk-In Scheduling Call in scheduling Mychart Sign-up: [email or web address]

## 2016-12-28 NOTE — Progress Notes (Signed)
Patient received Albuterol for wheezing this morning.

## 2017-01-18 ENCOUNTER — Other Ambulatory Visit: Payer: Self-pay | Admitting: Internal Medicine

## 2017-01-24 ENCOUNTER — Other Ambulatory Visit: Payer: Self-pay | Admitting: Internal Medicine

## 2017-02-23 ENCOUNTER — Telehealth: Payer: Self-pay | Admitting: Internal Medicine

## 2017-02-23 MED ORDER — BUDESONIDE-FORMOTEROL FUMARATE 160-4.5 MCG/ACT IN AERO: 2.0000 | INHALATION_SPRAY | Freq: Two times a day (BID) | RESPIRATORY_TRACT | 3 refills | Status: DC

## 2017-02-23 NOTE — Telephone Encounter (Signed)
Rx has been sent to preferred pharmacy. Nothing further needed.  

## 2017-03-19 ENCOUNTER — Other Ambulatory Visit: Payer: Self-pay | Admitting: Family Medicine

## 2017-03-19 DIAGNOSIS — I10 Essential (primary) hypertension: Secondary | ICD-10-CM

## 2017-03-22 ENCOUNTER — Ambulatory Visit (INDEPENDENT_AMBULATORY_CARE_PROVIDER_SITE_OTHER): Payer: 59 | Admitting: Family Medicine

## 2017-03-22 ENCOUNTER — Encounter: Payer: Self-pay | Admitting: Family Medicine

## 2017-03-22 VITALS — BP 112/78 | HR 80 | Temp 98.4°F | Wt 200.6 lb

## 2017-03-22 DIAGNOSIS — J309 Allergic rhinitis, unspecified: Secondary | ICD-10-CM | POA: Diagnosis not present

## 2017-03-22 DIAGNOSIS — J449 Chronic obstructive pulmonary disease, unspecified: Secondary | ICD-10-CM

## 2017-03-22 MED ORDER — PREDNISONE 10 MG PO TABS: ORAL_TABLET | ORAL | 0 refills | Status: DC

## 2017-03-22 MED ORDER — FLUTICASONE PROPIONATE 50 MCG/ACT NA SUSP: 2.0000 | Freq: Every day | NASAL | 6 refills | Status: DC

## 2017-03-22 NOTE — Patient Instructions (Addendum)
Please take prednisone with food as prescribed and add flonase with Allegra that you are currently taking. Continue Symbicort. If symptoms do not improve with treatment, worsen, or you develop new symptoms or are using your rescue inhaler more than twice weekly, please make an appointment for follow up.    WE NOW OFFER   [facility] FAST TRACK!!!  SAME DAY Appointments for ACUTE CARE  Such as: Sprains, Injuries, cuts, abrasions, rashes, muscle pain, joint pain, back pain Colds, flu, sore throats, headache, allergies, cough, fever  Ear pain, sinus and eye infections Abdominal pain, nausea, vomiting, diarrhea, upset stomach Animal/insect bites  3 Easy Ways to Schedule: Walk-In Scheduling Call in scheduling Mychart Sign-up: [email or web address]

## 2017-03-22 NOTE — Progress Notes (Signed)
Subjective:    Patient ID: Terri Richardson, female    DOB: October 13, 1971, 46 y.o.   MRN: [id number]  HPI  Terri Richardson is a 46 year old female who presents with itchy/watery eyes, rhinitis, and post nasal drip for 3 weeks.  Associated cough and wheezing have been present after allergy symptoms were present for 2 weeks..  Cough is productive with yellow sputum.  She denies SOB, fever, chills, sweats, N/V/D, myalgias, ear pain, tooth pain, sinus pressure/pain.  History of chronic asthmatic bronchitis that has been followed by pulmonology.   Treatment with Symbicort 160; 2 puffs in am and then 12 hours later 2 puffs has provided limited benefit however provides excellent benefit when allergy symptoms are not present.  She denies use of rescue inhaler. Treatment with allegra daily has provided limited benefit.   Review of Systems  Constitutional: Negative for chills, fatigue and fever.  HENT: Positive for postnasal drip and rhinorrhea. Negative for congestion, sinus pain, sinus pressure, sneezing and sore throat.   Eyes: Positive for itching.       Watery eyes, itching inside nares  Respiratory: Positive for cough and wheezing. Negative for shortness of breath.   Cardiovascular: Negative for chest pain and palpitations.  Gastrointestinal: Negative for abdominal pain, constipation, diarrhea, nausea and vomiting.  Musculoskeletal: Negative for myalgias.  Neurological: Negative for dizziness and headaches.   Past Medical History:  Diagnosis Date  . Diabetes mellitus 2006     Social History   Social History  . Marital status: Married    Spouse name: N/A  . Number of children: N/A  . Years of education: N/A   Occupational History  . Works at [employer]    Social History Main Topics  . Smoking status: Former Smoker    Packs/day: 0.50    Years: 4.00    Types: Cigarettes    Quit date: 02/20/2014  . Smokeless tobacco: Never Used  . Alcohol use No  . Drug use: No  . Sexual activity: Yes      Birth control/ protection: None   Other Topics Concern  . Not on file   Social History Narrative  . No narrative on file    Past Surgical History:  Procedure Laterality Date  . APPENDECTOMY  1st grade  . CESAREAN SECTION  2008  . CHOLECYSTECTOMY  11/11/2011   Procedure: LAPAROSCOPIC CHOLECYSTECTOMY WITH INTRAOPERATIVE CHOLANGIOGRAM;  Surgeon: Jetty Duhamel, MD;  Location: [facility] OR;  Service: General;  Laterality: N/A;  . TONSILLECTOMY  6th grade    Family History  Problem Relation Age of Onset  . Hyperlipidemia Mother   . Hypertension Father   . Tongue cancer Maternal Grandfather   . Prostate cancer Paternal Uncle   . Kidney cancer Maternal Uncle   . Heart disease Brother   . Hypertension Brother     No Known Allergies  Current Outpatient Prescriptions on File Prior to Visit  Medication Sig Dispense Refill  . albuterol (PROVENTIL HFA;VENTOLIN HFA) 108 (90 Base) MCG/ACT inhaler Inhale 2 puffs into the lungs every 6 (six) hours as needed for wheezing or shortness of breath. 1 Inhaler 0  . amLODipine (NORVASC) 5 MG tablet TAKE 1 TABLET BY MOUTH EVERY DAY 90 tablet 1  . ibuprofen (ADVIL,MOTRIN) 600 MG tablet Take 1 tablet (600 mg total) by mouth every 6 (six) hours as needed. 30 tablet 0  . losartan (COZAAR) 100 MG tablet TAKE 1 TABLET BY MOUTH EVERY DAY 90 tablet 1  . omeprazole (  PRILOSEC) 20 MG capsule Take 1 capsule (20 mg total) by mouth daily. 90 capsule 3  . sertraline (ZOLOFT) 100 MG tablet Take 1 tablet (100 mg total) by mouth daily. 90 tablet 1  . SYMBICORT 160-4.5 MCG/ACT inhaler Inhale 2 puffs into the lungs 2 (two) times daily.  11  . budesonide-formoterol (SYMBICORT) 160-4.5 MCG/ACT inhaler Inhale 2 puffs into the lungs 2 (two) times daily. 3 Inhaler 3   No current facility-administered medications on file prior to visit.     BP 112/78 (BP Location: Left Arm, Patient Position: Sitting, Cuff Size: Normal)   Pulse 80   Temp 98.4 F (36.9 C) (Oral)   Wt 200  lb 9.6 oz (91 kg)   SpO2 98%   BMI 36.69 kg/m       Objective:   Physical Exam  Constitutional: She is oriented to person, place, and time. She appears well-developed and well-nourished.  HENT:  Right Ear: Tympanic membrane normal.  Left Ear: Tympanic membrane normal.  Nose: Rhinorrhea present. Right sinus exhibits no maxillary sinus tenderness and no frontal sinus tenderness. Left sinus exhibits no maxillary sinus tenderness and no frontal sinus tenderness.  Mouth/Throat: Mucous membranes are normal. No oropharyngeal exudate or posterior oropharyngeal erythema.  Eyes: Pupils are equal, round, and reactive to light. No scleral icterus.  Neck: Neck supple.  Cardiovascular: Normal rate and regular rhythm.   Pulmonary/Chest: Effort normal.  Minimal scattered wheezing present  Abdominal: Soft. Bowel sounds are normal. There is no tenderness.  Lymphadenopathy:    She has no cervical adenopathy.  Neurological: She is alert and oriented to person, place, and time.  Skin: Skin is warm and dry. No rash noted.      Assessment & Plan:  1. Allergic rhinitis, unspecified seasonality, unspecified trigger Symptoms consistent with allergic rhinitis. Advised adding flonase with Allegra daily. - fluticasone (FLONASE) 50 MCG/ACT nasal spray; Place 2 sprays into both nostrils daily.  Dispense: 16 g; Refill: 6  2. Asthmatic bronchitis , chronic (HCC) Minimal scattered wheezing noted; flare occurred after symptoms of allergic rhinitis. Will treat with prednisone and we reviewed the use of a rescue inhaler and if she is using this > 2x/week, she will follow up for further evaluation and treatment.  Continue Symbicort and follow rescue plan established by pulmonology. - predniSONE (DELTASONE) 10 MG tablet; Take 4 tablets daily for 4 days, 3 tabs daily for 2 days, 2 tabs daily for 2 days, and one tab daily for 2 days.  Dispense: 28 tablet; Refill: 0  Follow up if symptoms do not improve with treatment,  worsen, or new symptoms develop. We particularly discussed the need for follow up if SOB or fever occurs.  Roddie McJulia Tobias Avitabile, FNP-C

## 2017-06-17 ENCOUNTER — Other Ambulatory Visit: Payer: Self-pay | Admitting: Family Medicine

## 2017-06-17 DIAGNOSIS — K219 Gastro-esophageal reflux disease without esophagitis: Secondary | ICD-10-CM

## 2017-06-23 ENCOUNTER — Other Ambulatory Visit: Payer: Self-pay | Admitting: Family Medicine

## 2017-06-23 DIAGNOSIS — F329 Major depressive disorder, single episode, unspecified: Secondary | ICD-10-CM

## 2017-06-23 DIAGNOSIS — F32A Depression, unspecified: Secondary | ICD-10-CM

## 2017-06-29 ENCOUNTER — Ambulatory Visit (INDEPENDENT_AMBULATORY_CARE_PROVIDER_SITE_OTHER): Payer: 59 | Admitting: Family Medicine

## 2017-06-29 ENCOUNTER — Ambulatory Visit: Payer: 59 | Admitting: Family Medicine

## 2017-06-29 ENCOUNTER — Encounter: Payer: Self-pay | Admitting: Family Medicine

## 2017-06-29 ENCOUNTER — Ambulatory Visit (INDEPENDENT_AMBULATORY_CARE_PROVIDER_SITE_OTHER)
Admission: RE | Admit: 2017-06-29 | Discharge: 2017-06-29 | Disposition: A | Discharge status: Home / Self Care | Payer: 59 | Source: Ambulatory Visit | Attending: Family Medicine | Admitting: Family Medicine

## 2017-06-29 VITALS — BP 100/80 | HR 87 | Temp 98.3°F | Ht 62.0 in | Wt 201.0 lb

## 2017-06-29 DIAGNOSIS — R05 Cough: Secondary | ICD-10-CM | POA: Diagnosis not present

## 2017-06-29 DIAGNOSIS — J988 Other specified respiratory disorders: Secondary | ICD-10-CM | POA: Diagnosis not present

## 2017-06-29 DIAGNOSIS — R059 Cough, unspecified: Secondary | ICD-10-CM

## 2017-06-29 DIAGNOSIS — J4541 Moderate persistent asthma with (acute) exacerbation: Secondary | ICD-10-CM

## 2017-06-29 IMAGING — DX DG CHEST 2V
2 series · 2 of 2 positions shown · non-contrast
Comparison: [DATE]

CLINICAL DATA: Cough

EXAM:
CHEST  2 VIEW

[chest pa]
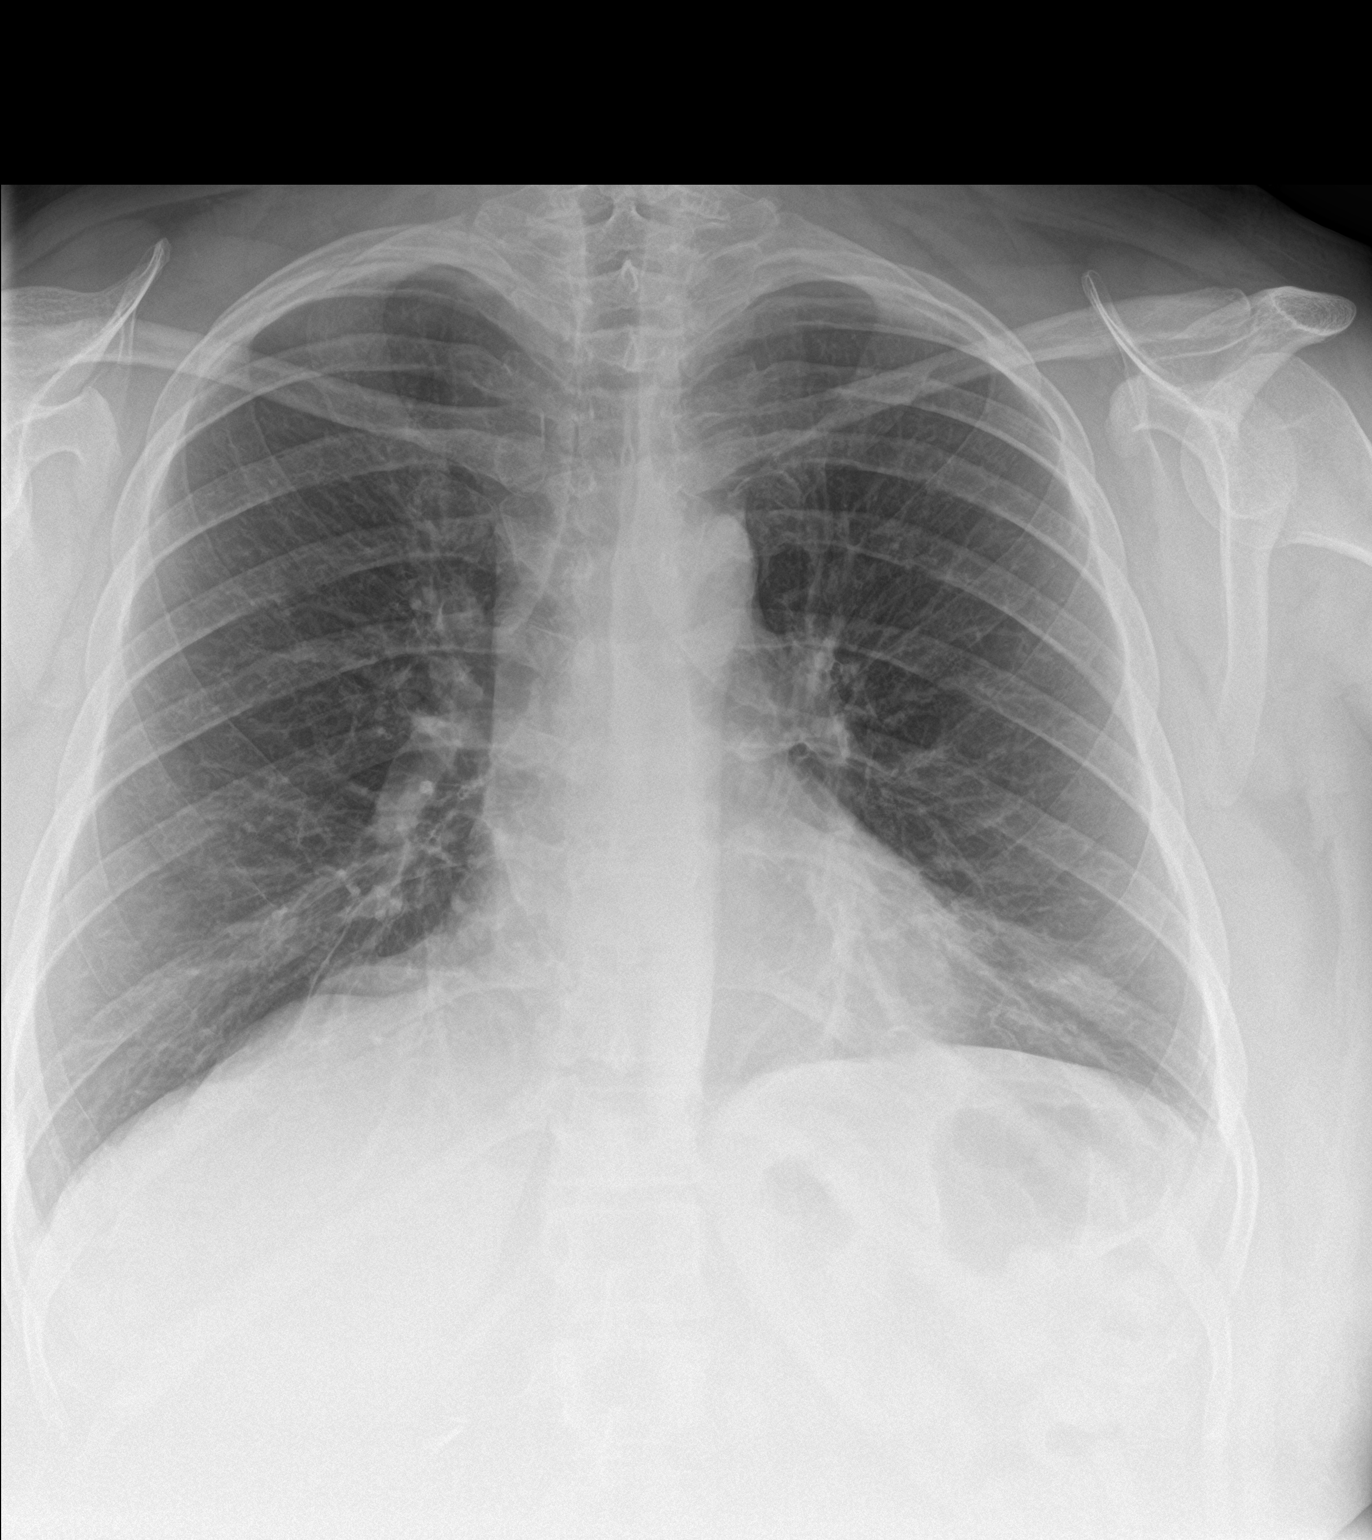

[chest lat]
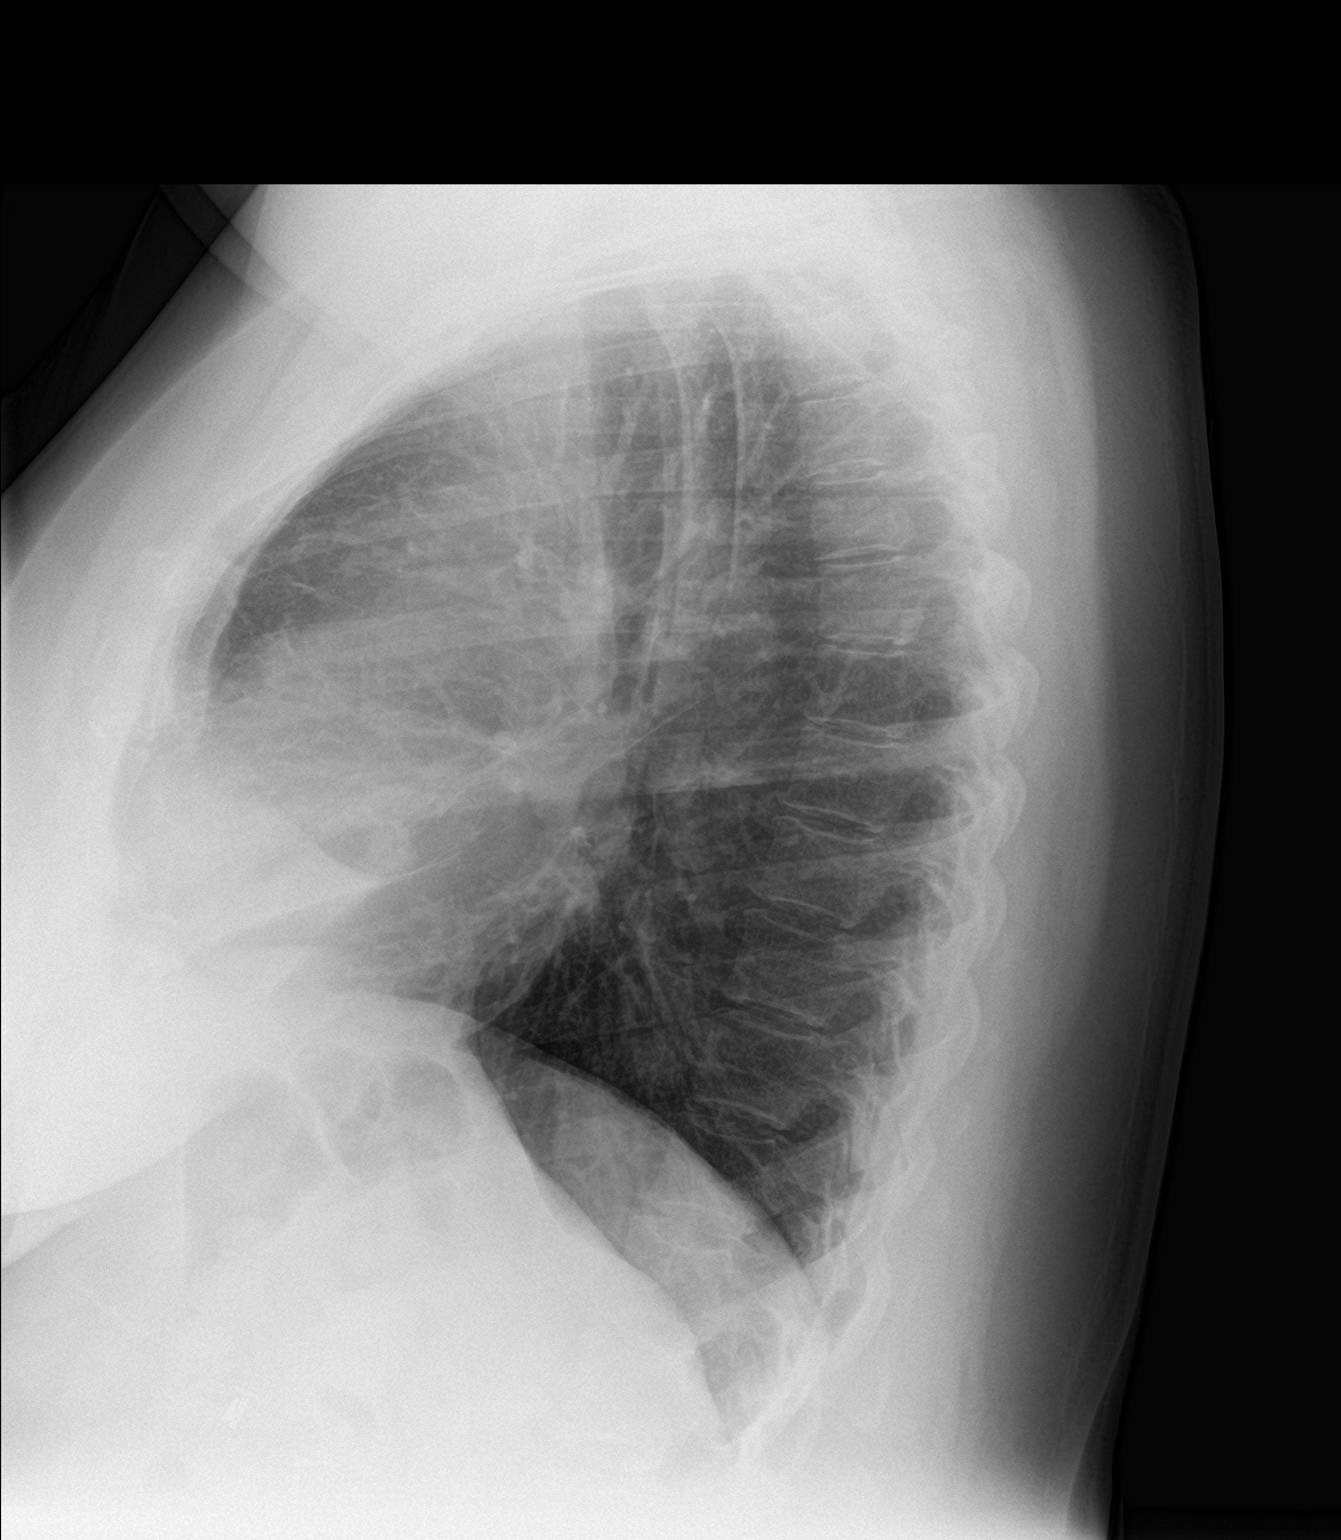

[2 of 2 positions shown; findings below may reference images not displayed]

FINDINGS: Lingular atelectasis or scarring is similar prior study. No
confluent opacities. Heart is normal size. No effusions or acute
bony abnormality.
IMPRESSION: Lingular scarring or atelectasis.  Stable.  No active disease.

## 2017-06-29 MED ORDER — PREDNISONE 20 MG PO TABS: 40.0000 mg | ORAL_TABLET | Freq: Every day | ORAL | 0 refills | Status: DC

## 2017-06-29 NOTE — Progress Notes (Signed)
HPI:  Acute visit for wheezing: -History of asthma on Symbicort daily and as needed albuterol, reports she has not used her albuterol -Has had cough, productive of yellow to green mucus and wheezing for about 1 week -Denies fevers, malaise, shortness of breath, hemoptysis, nausea, vomiting, body aches, sinus pain or tooth pain, reports minimal nasal congestion  ROS: See pertinent positives and negatives per HPI.  Past Medical History:  Diagnosis Date  . Diabetes mellitus 2006    Past Surgical History:  Procedure Laterality Date  . APPENDECTOMY  1st grade  . CESAREAN SECTION  2008  . CHOLECYSTECTOMY  11/11/2011   Procedure: LAPAROSCOPIC CHOLECYSTECTOMY WITH INTRAOPERATIVE CHOLANGIOGRAM;  Surgeon: Jetty Duhamel, MD;  Location: [facility] OR;  Service: General;  Laterality: N/A;  . TONSILLECTOMY  6th grade    Family History  Problem Relation Age of Onset  . Hyperlipidemia Mother   . Hypertension Father   . Tongue cancer Maternal Grandfather   . Prostate cancer Paternal Uncle   . Kidney cancer Maternal Uncle   . Heart disease Brother   . Hypertension Brother     Social History   Social History  . Marital status: Married    Spouse name: N/A  . Number of children: N/A  . Years of education: N/A   Occupational History  . Works at [employer]    Social History Main Topics  . Smoking status: Former Smoker    Packs/day: 0.50    Years: 4.00    Types: Cigarettes    Quit date: 02/20/2014  . Smokeless tobacco: Never Used  . Alcohol use No  . Drug use: No  . Sexual activity: Yes    Birth control/ protection: None   Other Topics Concern  . None   Social History Narrative  . None     Current Outpatient Prescriptions:  .  albuterol (PROVENTIL HFA;VENTOLIN HFA) 108 (90 Base) MCG/ACT inhaler, Inhale 2 puffs into the lungs every 6 (six) hours as needed for wheezing or shortness of breath., Disp: 1 Inhaler, Rfl: 0 .  amLODipine (NORVASC) 5 MG tablet, TAKE 1 TABLET BY MOUTH  EVERY DAY, Disp: 90 tablet, Rfl: 1 .  fluticasone (FLONASE) 50 MCG/ACT nasal spray, Place 2 sprays into both nostrils daily., Disp: 16 g, Rfl: 6 .  ibuprofen (ADVIL,MOTRIN) 600 MG tablet, Take 1 tablet (600 mg total) by mouth every 6 (six) hours as needed., Disp: 30 tablet, Rfl: 0 .  losartan (COZAAR) 100 MG tablet, TAKE 1 TABLET BY MOUTH EVERY DAY, Disp: 90 tablet, Rfl: 1 .  omeprazole (PRILOSEC) 20 MG capsule, Take 1 capsule (20 mg total) by mouth daily., Disp: 90 capsule, Rfl: 3 .  sertraline (ZOLOFT) 100 MG tablet, TAKE 1 TABLET (100 MG TOTAL) BY MOUTH DAILY., Disp: 90 tablet, Rfl: 0 .  SYMBICORT 160-4.5 MCG/ACT inhaler, Inhale 2 puffs into the lungs 2 (two) times daily., Disp: , Rfl: 11 .  predniSONE (DELTASONE) 20 MG tablet, Take 2 tablets (40 mg total) by mouth daily with breakfast., Disp: 8 tablet, Rfl: 0  EXAM:  Vitals:   06/29/17 1023  BP: 100/80  Pulse: 87  Temp: 98.3 F (36.8 C)  SpO2: 97%    Body mass index is 36.76 kg/m.  GENERAL: vitals reviewed and listed above, alert, oriented, appears well hydrated and in no acute distress  HEENT: atraumatic, conjunttiva clear, no obvious abnormalities on inspection of external nose and ears, normal appearance of ear canals and TMs, clear nasal congestion, mild post oropharyngeal erythema  with PND, no tonsillar edema or exudate, no sinus TTP  NECK: no obvious masses on inspection  LUNGS: diffuse expiratory wheeze, fairly decent air movement otherwise  CV: HRRR, no peripheral edema  MS: moves all extremities without noticeable abnormality  PSYCH: pleasant and cooperative, no obvious depression or anxiety  ASSESSMENT AND PLAN:  Discussed the following assessment and plan:  Moderate persistent asthma with acute exacerbation - Plan: DG Chest 2 View  Cough - Plan: DG Chest 2 View  Respiratory infection - Plan: DG Chest 2 View  -x-ray to exclude lower respiratory infection -Prednisone for acute asthma  exacerbation -Albuterol as needed -Patient advised to return or notify a doctor immediately if symptoms worsen or persist or new concerns arise.  Patient Instructions  BEFORE YOU LEAVE: -xray sheet -follow up: 1 month for NPV/transfer to Dr. Salomon FickBanks or Dr. SwazilandJordan  Go get the xray.  Start the prednisone and take as instructed.  Use your albuterol as needed per instructions.  I hope you are feeling better soon! Seek care immediately if worsening, new concerns or you are not improving with treatment.     Kriste BasqueKIM, Ericha Whittingham R., DO

## 2017-06-29 NOTE — Patient Instructions (Signed)
BEFORE YOU LEAVE: -xray sheet -follow up: 1 month for NPV/transfer to Dr. Salomon FickBanks or Dr. Swaz[place]Jordan  Go get the xray.  Start the prednisone and take as instructed.  Use your albuterol as needed per instructions.  I hope you are feeling better soon! Seek care immediately if worsening, new concerns or you are not improving with treatment.

## 2017-07-16 ENCOUNTER — Ambulatory Visit: Payer: 59 | Admitting: Family Medicine

## 2017-07-16 DIAGNOSIS — Z0289 Encounter for other administrative examinations: Secondary | ICD-10-CM

## 2017-09-06 NOTE — Progress Notes (Signed)
HPI:   [name] is a 46 y.o. female, who is here today to establish care.  Former PCP: Ms Terri Richardson Last preventive routine visit: Over a year ago.  Chronic medical problems: Asthma, obesity,depression, allergic rhinitis,and HTN among some Per records DM II. HgA1C in 12/2015 was 6.4. She is not checking BS's. Dx in 2017 (?). Hx of PCOS.   She follows with pulmonologist,Dr Wert.  Asthma: She has had wheezing intermittently and 2 weeks of cough with yellowish , denies hemoptysis.  Exertional dyspnea with "extraneous" activities. +Nocturnal symptoms.    Lab Results  Component Value Date   HGBA1C 6.4 01/03/2016   Hypertension:  Chronic, > 5 years ago.  Currently on Cozaar 100 mg daily an Norvasc 5 mg daily.  BP readings at home: similar to BP we got today. Eye exam: Over a year ago.   She is taking medications as instructed, no side effects reported.  She has not noted unusual headache, visual changes, exertional chest pain, dyspnea,  focal weakness, or edema.   Lab Results  Component Value Date   CREATININE 0.82 01/03/2016   BUN 12 01/03/2016   NA 140 01/03/2016   K 4.1 01/26/2016   CL 102 01/03/2016   CO2 26 01/03/2016    Anxiety and depression She is on Zoloft 100 mg daily. She has been on medication for 1-2 years.  She does not think medication is "strong enough", still having anxiety symptom.  She tried Paxil many years ago. She tried counseling a few years ago.  Denies suicidal thoughts.    Review of Systems  Constitutional: Positive for fatigue. Negative for activity change, appetite change and fever.  HENT: Positive for postnasal drip. Negative for mouth sores, nosebleeds, sore throat and trouble swallowing.   Eyes: Negative for redness and visual disturbance.  Respiratory: Positive for cough, shortness of breath and wheezing.   Cardiovascular: Negative for chest pain, palpitations and leg swelling.  Gastrointestinal:  Negative for abdominal pain, nausea and vomiting.       Negative for changes in bowel habits.  Endocrine: Negative for cold intolerance, heat intolerance, polydipsia, polyphagia and polyuria.  Genitourinary: Negative for decreased urine volume, dysuria and hematuria.  Musculoskeletal: Negative for gait problem and myalgias.  Skin: Negative for pallor and rash.  Allergic/Immunologic: Positive for environmental allergies.  Neurological: Negative for syncope, weakness, numbness and headaches.  Hematological: Negative for adenopathy. Does not bruise/bleed easily.  Psychiatric/Behavioral: Negative for confusion and suicidal ideas. The patient is nervous/anxious.       Current Outpatient Medications on File Prior to Visit  Medication Sig Dispense Refill  . albuterol (PROVENTIL HFA;VENTOLIN HFA) 108 (90 Base) MCG/ACT inhaler Inhale 2 puffs into the lungs every 6 (six) hours as needed for wheezing or shortness of breath. 1 Inhaler 0  . amLODipine (NORVASC) 5 MG tablet TAKE 1 TABLET BY MOUTH EVERY DAY 90 tablet 1  . fluticasone (FLONASE) 50 MCG/ACT nasal spray Place 2 sprays into both nostrils daily. 16 g 6  . ibuprofen (ADVIL,MOTRIN) 600 MG tablet Take 1 tablet (600 mg total) by mouth every 6 (six) hours as needed. 30 tablet 0  . losartan (COZAAR) 100 MG tablet TAKE 1 TABLET BY MOUTH EVERY DAY 90 tablet 1  . omeprazole (PRILOSEC) 20 MG capsule Take 1 capsule (20 mg total) by mouth daily. 90 capsule 3  . SYMBICORT 160-4.5 MCG/ACT inhaler Inhale 2 puffs into the lungs 2 (two) times daily.  11   No current facility-administered  medications on file prior to visit.      Past Medical History:  Diagnosis Date  . Diabetes mellitus 2006   No Known Allergies  Family History  Problem Relation Age of Onset  . Hyperlipidemia Mother   . Hypertension Father   . Tongue cancer Maternal Grandfather   . Prostate cancer Paternal Uncle   . Kidney cancer Maternal Uncle   . Heart disease Brother   .  Hypertension Brother     Social History   Socioeconomic History  . Marital status: Married    Spouse name: None  . Number of children: None  . Years of education: None  . Highest education level: None  Social Needs  . Financial resource strain: None  . Food insecurity - worry: None  . Food insecurity - inability: None  . Transportation needs - medical: None  . Transportation needs - non-medical: None  Occupational History  . Occupation: Works at [employer]  . Smoking status: Former Smoker    Packs/day: 0.50    Years: 4.00    Pack years: 2.00    Types: Cigarettes    Last attempt to quit: 02/20/2014    Years since quitting: 3.5  . Smokeless tobacco: Never Used  Substance and Sexual Activity  . Alcohol use: No    Alcohol/week: 0.0 oz  . Drug use: No  . Sexual activity: Yes    Birth control/protection: None  Other Topics Concern  . None  Social History Narrative  . None    Vitals:   09/07/17 1148  BP: 116/70  Pulse: 78  Resp: 12  Temp: 98.9 F (37.2 C)  SpO2: 97%    Body mass index is 35.73 kg/m.   Physical Exam  Nursing note and vitals reviewed. Constitutional: She is oriented to person, place, and time. She appears well-developed. She does not appear ill. No distress.  HENT:  Head: Normocephalic and atraumatic.  Right Ear: Tympanic membrane, external ear and ear canal normal.  Left Ear: Tympanic membrane, external ear and ear canal normal.  Mouth/Throat: Uvula is midline, oropharynx is clear and moist and mucous membranes are normal.  Eyes: Conjunctivae are normal.  Neck: No muscular tenderness present. No edema and no erythema present.  Cardiovascular: Normal rate and regular rhythm.  No murmur heard. Respiratory: Effort normal. No stridor. No respiratory distress. She has wheezes. She has no rhonchi. She has no rales.  GI: Soft. She exhibits no mass. There is no hepatomegaly. There is no tenderness.  Musculoskeletal: She exhibits no edema  or tenderness.  Lymphadenopathy:    She has no cervical adenopathy.  Neurological: She is alert and oriented to person, place, and time. She has normal strength.  Skin: Skin is warm. No rash noted. No erythema.  Psychiatric: Her speech is normal. Her affect is blunt. She expresses no suicidal ideation.  Well groomed, poor eye contact.    ASSESSMENT AND PLAN:   Ms. Gianella was seen today for establish care.  Diagnoses and all orders for this visit:  Lab Results  Component Value Date   HGBA1C 5.9 09/07/2017   Lab Results  Component Value Date   CREATININE 0.89 09/07/2017   BUN 18 09/07/2017   NA 140 09/07/2017   K 3.9 09/07/2017   CL 105 09/07/2017   CO2 25 09/07/2017   Lab Results  Component Value Date   MICROALBUR 1.5 09/07/2017   Lab Results  Component Value Date   WBC 9.6 09/07/2017   HGB  14.5 09/07/2017   HCT 44.4 09/07/2017   MCV 92.8 09/07/2017   PLT 291.0 09/07/2017    Mild intermittent reactive airway disease with wheezing with acute exacerbation  CXR done 06/2017:Lingular scarring or atelectasis.Stable.  No active disease. Wheezing improved with Albuterol neb treatment, no rales. Continue Symbicort 160-4.5 mcg bid. Singulair 10 mg added. Prednisone side effects discussed, recommend 5 days treatment. Albuterol inh 2 puff every 6 hours for a week then as needed for wheezing or shortness of breath.  F/U in 2 weeks.  -     CBC -     montelukast (SINGULAIR) 10 MG tablet; Take 1 tablet (10 mg total) at bedtime by mouth. -     predniSONE (DELTASONE) 20 MG tablet; Take 2 tablets (40 mg total) daily with breakfast for 5 days by mouth.  Type 2 diabetes mellitus with other specified complication, without long-term current use of insulin (HCC)  Not clear about this Dx, it is not report by pt. Further recommendations will be given according to lab results.  -     Hemoglobin A1c -     Basic metabolic panel -     Microalbumin / creatinine urine ratio -      Fructosamine  Essential hypertension  Adequately controlled. No changes in current management. DASH diet recommended. Eye exam recommended annually. F/U in 6 months, before if needed.  Anxiety disorder, unspecified type  Not well controlled. After discussion of other pharmacologic treatment and some side effects, she agrees with Effexor 75 mg daily. I recommend stopping Zoloft and starting Effexor XR 75 mg daily, I am nt anticipating problems with changes but of she has withdrawal symptoms we will wean off Zoloft and titrate Effexor. Instructed about warning signs.  F/U in 2 weeks.  -     venlafaxine XR (EFFEXOR XR) 75 MG 24 hr capsule; Take 1 capsule (75 mg total) daily with breakfast by mouth.     Betty G. SwazilandJordan, MD  [facility]. [place] office.

## 2017-09-07 ENCOUNTER — Ambulatory Visit (INDEPENDENT_AMBULATORY_CARE_PROVIDER_SITE_OTHER): Payer: 59 | Admitting: Family Medicine

## 2017-09-07 ENCOUNTER — Encounter: Payer: Self-pay | Admitting: Family Medicine

## 2017-09-07 VITALS — BP 116/70 | HR 78 | Temp 98.9°F | Resp 12 | Ht 62.0 in | Wt 195.4 lb

## 2017-09-07 DIAGNOSIS — J4521 Mild intermittent asthma with (acute) exacerbation: Secondary | ICD-10-CM | POA: Diagnosis not present

## 2017-09-07 DIAGNOSIS — F419 Anxiety disorder, unspecified: Secondary | ICD-10-CM | POA: Diagnosis not present

## 2017-09-07 DIAGNOSIS — I10 Essential (primary) hypertension: Secondary | ICD-10-CM

## 2017-09-07 DIAGNOSIS — E1169 Type 2 diabetes mellitus with other specified complication: Secondary | ICD-10-CM | POA: Diagnosis not present

## 2017-09-07 LAB — CBC
HCT: 44.4 % (ref 36.0–46.0)
HEMOGLOBIN: 14.5 g/dL (ref 12.0–15.0)
MCHC: 32.6 g/dL (ref 30.0–36.0)
MCV: 92.8 fl (ref 78.0–100.0)
PLATELETS: 291 10*3/uL (ref 150.0–400.0)
RBC: 4.78 Mil/uL (ref 3.87–5.11)
RDW: 13.5 % (ref 11.5–15.5)
WBC: 9.6 10*3/uL (ref 4.0–10.5)

## 2017-09-07 LAB — BASIC METABOLIC PANEL
BUN: 18 mg/dL (ref 6–23)
CO2: 25 meq/L (ref 19–32)
Calcium: 9.6 mg/dL (ref 8.4–10.5)
Chloride: 105 mEq/L (ref 96–112)
Creatinine, Ser: 0.89 mg/dL (ref 0.40–1.20)
GFR: 73 mL/min (ref >60.00)
GLUCOSE: 95 mg/dL (ref 70–99)
POTASSIUM: 3.9 meq/L (ref 3.5–5.1)
SODIUM: 140 meq/L (ref 135–145)

## 2017-09-07 LAB — MICROALBUMIN / CREATININE URINE RATIO
Creatinine,U: 197.1 mg/dL
MICROALB/CREAT RATIO: 0.8 mg/g (ref 0.0–30.0)
Microalb, Ur: 1.5 mg/dL (ref 0.0–1.9)

## 2017-09-07 LAB — HEMOGLOBIN A1C: Hgb A1c MFr Bld: 5.9 % (ref 4.6–6.5)

## 2017-09-07 MED ORDER — PREDNISONE 20 MG PO TABS: 40.0000 mg | ORAL_TABLET | Freq: Every day | ORAL | 0 refills | Status: AC

## 2017-09-07 MED ORDER — MONTELUKAST SODIUM 10 MG PO TABS: 10.0000 mg | ORAL_TABLET | Freq: Every day | ORAL | 3 refills | Status: DC

## 2017-09-07 MED ORDER — VENLAFAXINE HCL ER 75 MG PO CP24: 75.0000 mg | ORAL_CAPSULE | Freq: Every day | ORAL | 2 refills | Status: DC

## 2017-09-07 NOTE — Patient Instructions (Signed)
A few things to remember from today's visit:   Mild intermittent reactive airway disease with wheezing with acute exacerbation - Plan: CBC  Type 2 diabetes mellitus with other specified complication, without long-term current use of insulin (HCC) - Plan: Hemoglobin A1c, Basic metabolic panel, Microalbumin / creatinine urine ratio, Fructosamine   Stop Zoloft and start Effexor.  Albuterol inh 2 puff every 6 hours for a week then as needed for wheezing or shortness of breath.     Please be sure medication list is accurate. If a new problem present, please set up appointment sooner than planned today.

## 2017-09-10 LAB — FRUCTOSAMINE: FRUCTOSAMINE: 209 umol/L (ref 190–270)

## 2017-09-16 ENCOUNTER — Other Ambulatory Visit: Payer: Self-pay | Admitting: Family Medicine

## 2017-09-16 DIAGNOSIS — I10 Essential (primary) hypertension: Secondary | ICD-10-CM

## 2017-09-17 NOTE — Telephone Encounter (Signed)
Dr Jordans pt 

## 2017-09-18 ENCOUNTER — Ambulatory Visit: Payer: 59 | Admitting: Family Medicine

## 2017-09-24 ENCOUNTER — Other Ambulatory Visit: Payer: Self-pay | Admitting: Family Medicine

## 2017-09-24 DIAGNOSIS — F32A Depression, unspecified: Secondary | ICD-10-CM

## 2017-09-24 DIAGNOSIS — F329 Major depressive disorder, single episode, unspecified: Secondary | ICD-10-CM

## 2017-09-24 NOTE — Telephone Encounter (Signed)
Dr Jordans pt 

## 2017-11-06 ENCOUNTER — Telehealth: Payer: Self-pay | Admitting: Family Medicine

## 2017-11-06 ENCOUNTER — Other Ambulatory Visit: Payer: Self-pay | Admitting: *Deleted

## 2017-11-06 DIAGNOSIS — F419 Anxiety disorder, unspecified: Secondary | ICD-10-CM

## 2017-11-06 DIAGNOSIS — J4521 Mild intermittent asthma with (acute) exacerbation: Secondary | ICD-10-CM

## 2017-11-06 MED ORDER — VENLAFAXINE HCL ER 75 MG PO CP24: 75.0000 mg | ORAL_CAPSULE | Freq: Every day | ORAL | 1 refills | Status: DC

## 2017-11-06 MED ORDER — MONTELUKAST SODIUM 10 MG PO TABS: 10.0000 mg | ORAL_TABLET | Freq: Every day | ORAL | 1 refills | Status: DC

## 2017-11-06 NOTE — Telephone Encounter (Signed)
Copied from CRM [id number]. Topic: Quick Communication - Rx Refill/Question >> Nov 06, 2017 12:40 PM Alexander BergeronBarksdale, Harvey B wrote: Medication: montelukast (SINGULAIR) 10 MG tablet [[id number]], venlafaxine XR (EFFEXOR XR) 75 MG 24 hr capsule [[id number]]  Pt got a letter from her insurance company stating that they will cover these Rx's for 90 day supply, contact pt if needed

## 2017-11-06 NOTE — Telephone Encounter (Signed)
90 day supply sent to pharmacy

## 2017-11-14 NOTE — Telephone Encounter (Signed)
Attempted to call the pt. to discuss having the requested Rx refills on Singulair and Effexor to be transferred to [facility] on [address]  I called [facility] and inquired how the patient can initiate this; [facility], the pt. can request the preferred pharmacy to request the specific medication to be transferred over from the pharmacy the pt. Wants it moved from.  Left this message on pt's voice mail.  Advised to call back with any questions.

## 2017-11-14 NOTE — Telephone Encounter (Signed)
Patient said she needs this sent [id number], [place] - [address] & [facility].

## 2017-11-27 ENCOUNTER — Ambulatory Visit (INDEPENDENT_AMBULATORY_CARE_PROVIDER_SITE_OTHER): Payer: 59 | Admitting: Family Medicine

## 2017-11-27 ENCOUNTER — Encounter: Payer: Self-pay | Admitting: Family Medicine

## 2017-11-27 VITALS — BP 122/78 | HR 92 | Temp 98.3°F | Resp 12 | Ht 62.0 in | Wt 196.4 lb

## 2017-11-27 DIAGNOSIS — Z1231 Encounter for screening mammogram for malignant neoplasm of breast: Secondary | ICD-10-CM

## 2017-11-27 DIAGNOSIS — Z1239 Encounter for other screening for malignant neoplasm of breast: Secondary | ICD-10-CM

## 2017-11-27 DIAGNOSIS — L02213 Cutaneous abscess of chest wall: Secondary | ICD-10-CM | POA: Diagnosis not present

## 2017-11-27 MED ORDER — SULFAMETHOXAZOLE-TRIMETHOPRIM 800-160 MG PO TABS: 1.0000 | ORAL_TABLET | Freq: Two times a day (BID) | ORAL | 0 refills | Status: AC

## 2017-11-27 NOTE — Patient Instructions (Addendum)
A few things to remember from today's visit:   Cutaneous abscess of chest wall - Plan: sulfamethoxazole-trimethoprim (BACTRIM DS,SEPTRA DS) 800-160 MG tablet  Breast cancer screening - Plan: MM SCREENING BREAST TOMO BILATERAL  Skin Abscess A skin abscess is an infected area on or under your skin that contains pus and other material. An abscess can happen almost anywhere on your body. Some abscesses break open (rupture) on their own. Most continue to get worse unless they are treated. The infection can spread deeper into the body and into your blood, which can make you feel sick. Treatment usually involves draining the abscess. Follow these instructions at home: Abscess Care  If you have an abscess that has not drained, place a warm, clean, wet washcloth over the abscess several times a day. Do this as told by your doctor.  Follow instructions from your doctor about how to take care of your abscess. Make sure you: ? Cover the abscess with a bandage (dressing). ? Change your bandage or gauze as told by your doctor. ? Wash your hands with soap and water before you change the bandage or gauze. If you cannot use soap and water, use hand sanitizer.  Check your abscess every day for signs that the infection is getting worse. Check for: ? More redness, swelling, or pain. ? More fluid or blood. ? Warmth. ? More pus or a bad smell. Medicines   Take over-the-counter and prescription medicines only as told by your doctor.  If you were prescribed an antibiotic medicine, take it as told by your doctor. Do not stop taking the antibiotic even if you start to feel better. General instructions  To avoid spreading the infection: ? Do not share personal care items, towels, or hot tubs with others. ? Avoid making skin-to-skin contact with other people.  Keep all follow-up visits as told by your doctor. This is important. Contact a doctor if:  You have more redness, swelling, or pain around your  abscess.  You have more fluid or blood coming from your abscess.  Your abscess feels warm when you touch it.  You have more pus or a bad smell coming from your abscess.  You have a fever.  Your muscles ache.  You have chills.  You feel sick. Get help right away if:  You have very bad (severe) pain.  You see red streaks on your skin spreading away from the abscess. This information is not intended to replace advice given to you by your health care provider. Make sure you discuss any questions you have with your health care provider. Document Released: 03/27/2008 Document Revised: 06/04/2016 Document Reviewed: 08/18/2015 Elsevier Interactive Patient Education  2018 [employer]   Please be sure medication list is accurate. If a new problem present, please set up appointment sooner than planned today.

## 2017-11-27 NOTE — Progress Notes (Signed)
ACUTE VISIT   HPI:  Chief Complaint  Patient presents with  . Knot in middle of chest    started as tiny pimple 2 weeks ago, now painful to touch, has gotten bigger and red     [name] is a 47 y.o. female, who is here today complaining of tender "lump" on the middle of her chest. She notes a lesion about 2 weeks ago, it was smaller and not tender. Yesterday when she was taking a shower she felt some "twitching" feeling around the area, she noted that lesion was bigger and erythematous. Intermittent sharp moderate pain, exacerbated by touch. Constant dull, milder pain.  No history of trauma. She denies fever, chills, body aches. She has no noted cough, wheezing, dyspnea.  She has not try OTC medications.  History of DM 2, diet controlled, she denies checking BS.  No breast masses, nipple discharge, or tenderness. She has not had a mammogram.  Review of Systems  Constitutional: Negative for appetite change, chills, fatigue and fever.  Respiratory: Negative for cough, shortness of breath and wheezing.   Cardiovascular: Negative for chest pain, palpitations and leg swelling.  Gastrointestinal: Negative for abdominal pain, diarrhea, nausea and vomiting.  Endocrine: Negative for polydipsia, polyphagia and polyuria.  Musculoskeletal: Negative for back pain and myalgias.  Skin: Positive for rash. Negative for wound.  Neurological: Negative for weakness and numbness.  Hematological: Negative for adenopathy. Does not bruise/bleed easily.  Psychiatric/Behavioral: Negative for confusion. The patient is nervous/anxious.       Current Outpatient Medications on File Prior to Visit  Medication Sig Dispense Refill  . albuterol (PROVENTIL HFA;VENTOLIN HFA) 108 (90 Base) MCG/ACT inhaler Inhale 2 puffs into the lungs every 6 (six) hours as needed for wheezing or shortness of breath. 1 Inhaler 0  . amLODipine (NORVASC) 5 MG tablet TAKE 1 TABLET BY MOUTH EVERY DAY 90  tablet 1  . fluticasone (FLONASE) 50 MCG/ACT nasal spray Place 2 sprays into both nostrils daily. 16 g 6  . ibuprofen (ADVIL,MOTRIN) 600 MG tablet Take 1 tablet (600 mg total) by mouth every 6 (six) hours as needed. 30 tablet 0  . losartan (COZAAR) 100 MG tablet TAKE 1 TABLET BY MOUTH EVERY DAY 90 tablet 1  . montelukast (SINGULAIR) 10 MG tablet Take 1 tablet (10 mg total) by mouth at bedtime. 90 tablet 1  . omeprazole (PRILOSEC) 20 MG capsule Take 1 capsule (20 mg total) by mouth daily. 90 capsule 3  . SYMBICORT 160-4.5 MCG/ACT inhaler Inhale 2 puffs into the lungs 2 (two) times daily.  11  . venlafaxine XR (EFFEXOR XR) 75 MG 24 hr capsule Take 1 capsule (75 mg total) by mouth daily with breakfast. 90 capsule 1   No current facility-administered medications on file prior to visit.      Past Medical History:  Diagnosis Date  . Allergy   . Asthma   . Depression   . Diabetes mellitus 2006  . GERD (gastroesophageal reflux disease)   . Hypertension    No Known Allergies  Social History   Socioeconomic History  . Marital status: Married    Spouse name: None  . Number of children: None  . Years of education: None  . Highest education level: None  Social Needs  . Financial resource strain: None  . Food insecurity - worry: None  . Food insecurity - inability: None  . Transportation needs - medical: None  . Transportation needs - non-medical: None  Occupational  History  . Occupation: Works at [employer]  Tobacco Use  . Smoking status: Former Smoker    Packs/day: 0.50    Years: 4.00    Pack years: 2.00    Types: Cigarettes    Last attempt to quit: 02/20/2014    Years since quitting: 3.7  . Smokeless tobacco: Never Used  Substance and Sexual Activity  . Alcohol use: No    Alcohol/week: 0.0 oz  . Drug use: No  . Sexual activity: Yes    Birth control/protection: None  Other Topics Concern  . None  Social History Narrative  . None    Vitals:   11/27/17 1550 11/27/17 1631   BP: 122/78   Pulse: (!) 101 92  Resp: 12   Temp: 98.3 F (36.8 C)   SpO2: 97%    Body mass index is 35.92 kg/m.   Physical Exam  Nursing note and vitals reviewed. Constitutional: She is oriented to person, place, and time. She appears well-developed. No distress.  HENT:  Head: Normocephalic and atraumatic.  Eyes: Conjunctivae are normal.  Cardiovascular: Normal rate and regular rhythm.  No murmur heard. Respiratory: Effort normal and breath sounds normal. No respiratory distress.  Genitourinary:  Genitourinary Comments: Breast: Fibrocystic changes L>R, mainly in outer upper quadrants.  No masses, nipple discharge, or skin changes bilateral.  Musculoskeletal: She exhibits no edema.  Lymphadenopathy:    She has no cervical adenopathy.    She has no axillary adenopathy.  Neurological: She is alert and oriented to person, place, and time. She has normal strength.  Skin: Skin is warm. No rash noted. Rash is not vesicular. There is erythema.     Sternal area, in between breasts, more towards the left there is a 2.5 nodular lesions and 3.5-4 surrounded erythema and mild induration. Tender,no fluctuant area. + Local heat.  Psychiatric: She has a normal mood and affect.  Well groomed, good eye contact.     ASSESSMENT AND PLAN:   [name] was seen today for knot in middle of chest.  Diagnoses and all orders for this visit:  Cutaneous abscess of chest wall  ? Underlying sebaceous cyst. Not ready for I&D. Recommend local warm compresses several times per day. Bactrim twice daily for 7 days recommended. Clearly instructed about warning signs. Follow-up in 2-3 days if lesion has not greatly improved.  -     sulfamethoxazole-trimethoprim (BACTRIM DS,SEPTRA DS) 800-160 MG tablet; Take 1 tablet by mouth 2 (two) times daily for 7 days.  Breast cancer screening -     MM SCREENING BREAST TOMO BILATERAL; Future    -[name] was advised to seek immediate medical  attention if sudden worsening symptoms or to follow if they persist or if new concerns arise.       Betty G. SwazilandJordan, MD  [facility]. [place] office.

## 2017-12-14 ENCOUNTER — Other Ambulatory Visit: Payer: Self-pay | Admitting: Family Medicine

## 2017-12-14 DIAGNOSIS — J309 Allergic rhinitis, unspecified: Secondary | ICD-10-CM

## 2017-12-19 ENCOUNTER — Other Ambulatory Visit: Payer: Self-pay | Admitting: Family Medicine

## 2017-12-19 DIAGNOSIS — K219 Gastro-esophageal reflux disease without esophagitis: Secondary | ICD-10-CM

## 2018-02-15 ENCOUNTER — Other Ambulatory Visit: Payer: Self-pay | Admitting: Internal Medicine

## 2018-03-27 ENCOUNTER — Other Ambulatory Visit: Payer: Self-pay | Admitting: Family Medicine

## 2018-03-27 DIAGNOSIS — I10 Essential (primary) hypertension: Secondary | ICD-10-CM

## 2018-04-17 ENCOUNTER — Telehealth: Payer: Self-pay | Admitting: Family Medicine

## 2018-04-17 ENCOUNTER — Encounter: Payer: Self-pay | Admitting: Family Medicine

## 2018-04-17 ENCOUNTER — Ambulatory Visit (INDEPENDENT_AMBULATORY_CARE_PROVIDER_SITE_OTHER): Payer: Self-pay | Admitting: Family Medicine

## 2018-04-17 VITALS — BP 98/70 | HR 124 | Temp 100.6°F | Wt 189.0 lb

## 2018-04-17 DIAGNOSIS — R059 Cough, unspecified: Secondary | ICD-10-CM

## 2018-04-17 DIAGNOSIS — R05 Cough: Secondary | ICD-10-CM

## 2018-04-17 LAB — D-DIMER, QUANTITATIVE (NOT AT ARMC): D DIMER QUANT: 0.48 ug{FEU}/mL (ref <0.50)

## 2018-04-17 NOTE — Progress Notes (Signed)
Subjective:    Patient ID: Terri Richardson, female    DOB: 10-Aug-1971, 47 y.o.   MRN: [id number]  No chief complaint on file. Pt accompanied by her husband and son.  HPI Patient was seen today for acute concern.  Pt endorses coughing, pain with breathing deep, and back pain x 1 day.  Pt has not taking anything for this. Pt endorses subjective fever and chills.  Pt denies sick contacts, wheezing.  Past Medical History:  Diagnosis Date  . Allergy   . Asthma   . Depression   . Diabetes mellitus 2006  . GERD (gastroesophageal reflux disease)   . Hypertension     No Known Allergies  ROS General: Denies fever, chills, night sweats, changes in weight, changes in appetite HEENT: Denies headaches, ear pain, changes in vision, rhinorrhea, sore throat CV: Denies CP, palpitations, SOB, orthopnea Pulm: Denies SOB,  Wheezing  +cough GI: Denies abdominal pain, nausea, vomiting, diarrhea, constipation GU: Denies dysuria, hematuria, frequency, vaginal discharge Msk: Denies muscle cramps, joint pains  +pain with breathing and back pain. Neuro: Denies weakness, numbness, tingling Skin: Denies rashes, bruising Psych: Denies depression, anxiety, hallucinations     Objective:    Blood pressure 98/70, pulse (!) 124, temperature (!) 100.6 F (38.1 C), temperature source Oral, weight 189 lb (85.7 kg), SpO2 97 %.   Gen. Pleasant, well-nourished, in no distress, flat affect. HEENT: [place]/AT, face symmetric, no scleral icterus, PERRLA, nares patent without drainage, pharynx without erythema or exudate.  TMs normal b/l. No cervical lymphadenopathy. Lungs: no accessory muscle use, mildly decreased bs in LLL, no wheezes or rales Cardiovascular: tachycardia, no m/r/g, no peripheral edema.  No TTP of chest. Abdomen: BS present, soft, NT/ND Musculoskeletal: No deformities, no cyanosis or clubbing, normal tone  Neuro:  A&Ox3, CN II-XII intact, normal gait   Wt Readings from Last 3 Encounters:  04/17/18  189 lb (85.7 kg)  11/27/17 196 lb 6 oz (89.1 kg)  09/07/17 195 lb 6 oz (88.6 kg)    Lab Results  Component Value Date   WBC 9.6 09/07/2017   HGB 14.5 09/07/2017   HCT 44.4 09/07/2017   PLT 291.0 09/07/2017   GLUCOSE 95 09/07/2017   CHOL 187 01/03/2016   ALT 23 01/03/2016   AST 19 01/03/2016   NA 140 09/07/2017   K 3.9 09/07/2017   CL 105 09/07/2017   CREATININE 0.89 09/07/2017   BUN 18 09/07/2017   CO2 25 09/07/2017   HGBA1C 5.9 09/07/2017   MICROALBUR 1.5 09/07/2017    Assessment/Plan:  Cough  -given tachycardia and h/o subjective fever concern for pna or PE vs viral cause. -will obtain stat labs -as after 5 pm pt to have CXR in am, but given strict ED precautions. - Plan: CBC with Differential/Platelet, D-dimer, Quantitative, DG Chest 2 View  Abbe AmsterdamShannon Adalene Gulotta, MD

## 2018-04-17 NOTE — Patient Instructions (Signed)
Cough, Adult  Coughing is a reflex that clears your throat and your airways. Coughing helps to heal and protect your lungs. It is normal to cough occasionally, but a cough that happens with other symptoms or lasts a long time may be a sign of a condition that needs treatment. A cough may last only 2-3 weeks (acute), or it may last longer than 8 weeks (chronic).  What are the causes?  Coughing is commonly caused by:   Breathing in substances that irritate your lungs.   A viral or bacterial respiratory infection.   Allergies.   Asthma.   Postnasal drip.   Smoking.   Acid backing up from the stomach into the esophagus (gastroesophageal reflux).   Certain medicines.   Chronic lung problems, including COPD (or rarely, lung cancer).   Other medical conditions such as heart failure.    Follow these instructions at home:  Pay attention to any changes in your symptoms. Take these actions to help with your discomfort:   Take medicines only as told by your health care provider.  ? If you were prescribed an antibiotic medicine, take it as told by your health care provider. Do not stop taking the antibiotic even if you start to feel better.  ? Talk with your health care provider before you take a cough suppressant medicine.   Drink enough fluid to keep your urine clear or pale yellow.   If the air is dry, use a cold steam vaporizer or humidifier in your bedroom or your home to help loosen secretions.   Avoid anything that causes you to cough at work or at home.   If your cough is worse at night, try sleeping in a semi-upright position.   Avoid cigarette smoke. If you smoke, quit smoking. If you need help quitting, ask your health care provider.   Avoid caffeine.   Avoid alcohol.   Rest as needed.    Contact a health care provider if:   You have new symptoms.   You cough up pus.   Your cough does not get better after 2-3 weeks, or your cough gets worse.   You cannot control your cough with suppressant  medicines and you are losing sleep.   You develop pain that is getting worse or pain that is not controlled with pain medicines.   You have a fever.   You have unexplained weight loss.   You have night sweats.  Get help right away if:   You cough up blood.   You have difficulty breathing.   Your heartbeat is very fast.  This information is not intended to replace advice given to you by your health care provider. Make sure you discuss any questions you have with your health care provider.  Document Released: 04/07/2011 Document Revised: 03/16/2016 Document Reviewed: 12/16/2014  Elsevier Interactive Patient Education  2018 Elsevier Inc.

## 2018-04-18 ENCOUNTER — Telehealth: Payer: Self-pay

## 2018-04-18 ENCOUNTER — Other Ambulatory Visit: Payer: Self-pay | Admitting: Family Medicine

## 2018-04-18 DIAGNOSIS — J189 Pneumonia, unspecified organism: Secondary | ICD-10-CM

## 2018-04-18 LAB — CBC WITH DIFFERENTIAL/PLATELET
Basophils Absolute: 0.1 10*3/uL (ref 0.0–0.1)
Basophils Relative: 0.6 % (ref 0.0–3.0)
EOS ABS: 0.1 10*3/uL (ref 0.0–0.7)
Eosinophils Relative: 0.5 % (ref 0.0–5.0)
HCT: 38.8 % (ref 36.0–46.0)
Hemoglobin: 12.8 g/dL (ref 12.0–15.0)
LYMPHS ABS: 2.2 10*3/uL (ref 0.7–4.0)
Lymphocytes Relative: 11.1 % — ABNORMAL LOW (ref 12.0–46.0)
MCHC: 33 g/dL (ref 30.0–36.0)
MCV: 92.6 fl (ref 78.0–100.0)
MONOS PCT: 8.3 % (ref 3.0–12.0)
Monocytes Absolute: 1.6 10*3/uL — ABNORMAL HIGH (ref 0.1–1.0)
NEUTROS ABS: 15.6 10*3/uL — AB (ref 1.4–7.7)
NEUTROS PCT: 79.5 % — AB (ref 43.0–77.0)
PLATELETS: 291 10*3/uL (ref 150.0–400.0)
RBC: 4.2 Mil/uL (ref 3.87–5.11)
RDW: 13.1 % (ref 11.5–15.5)
WBC: 19.6 10*3/uL (ref 4.0–10.5)

## 2018-04-18 MED ORDER — AZITHROMYCIN 250 MG PO TABS: ORAL_TABLET | ORAL | 0 refills | Status: DC

## 2018-04-18 NOTE — Telephone Encounter (Signed)
Called pt twice for her lab results, no option to leave a message, voicemail was full. Will try tomorrow.

## 2018-04-19 ENCOUNTER — Telehealth: Payer: Self-pay

## 2018-04-19 NOTE — Telephone Encounter (Signed)
Spoke with pt this morning clarified that she needs to get the Chest x-ray done due to her labs results showing elevated WBC, pt was advised to go to have the chest x ray done to rule out infection and to start the Antibiotic which was sent to her pharmacy by Dr Salomon FickBanks. Pt voiced understanding.

## 2018-04-19 NOTE — Telephone Encounter (Signed)
Message sent to Dr. Jordan for approval. 

## 2018-04-19 NOTE — Telephone Encounter (Signed)
Left detailed message with instructions per Dr. SwazilandJordan concerning medication. Patient instructed to call office to schedule follow-up.

## 2018-04-19 NOTE — Telephone Encounter (Signed)
We have not discussed indication/problem for which omeprazole was recommended before. We can discuss this problem during next follow-up appointment.  In the meantime she can get omeprazole 20 mg over-the-counter.  Thanks, BJ

## 2018-05-07 ENCOUNTER — Other Ambulatory Visit: Payer: Self-pay | Admitting: Family Medicine

## 2018-05-07 DIAGNOSIS — J4521 Mild intermittent asthma with (acute) exacerbation: Secondary | ICD-10-CM

## 2018-05-07 DIAGNOSIS — F419 Anxiety disorder, unspecified: Secondary | ICD-10-CM

## 2018-05-19 ENCOUNTER — Other Ambulatory Visit: Payer: Self-pay | Admitting: Internal Medicine

## 2018-05-24 ENCOUNTER — Ambulatory Visit (INDEPENDENT_AMBULATORY_CARE_PROVIDER_SITE_OTHER): Payer: Self-pay | Admitting: Family Medicine

## 2018-05-24 ENCOUNTER — Encounter: Payer: Self-pay | Admitting: Family Medicine

## 2018-05-24 VITALS — BP 100/66 | HR 66 | Temp 99.1°F | Resp 12 | Ht 62.0 in | Wt 192.5 lb

## 2018-05-24 DIAGNOSIS — R310 Gross hematuria: Secondary | ICD-10-CM

## 2018-05-24 DIAGNOSIS — F330 Major depressive disorder, recurrent, mild: Secondary | ICD-10-CM | POA: Insufficient documentation

## 2018-05-24 DIAGNOSIS — F419 Anxiety disorder, unspecified: Secondary | ICD-10-CM

## 2018-05-24 DIAGNOSIS — E1169 Type 2 diabetes mellitus with other specified complication: Secondary | ICD-10-CM

## 2018-05-24 DIAGNOSIS — F3340 Major depressive disorder, recurrent, in remission, unspecified: Secondary | ICD-10-CM

## 2018-05-24 DIAGNOSIS — I10 Essential (primary) hypertension: Secondary | ICD-10-CM

## 2018-05-24 LAB — URINALYSIS, ROUTINE W REFLEX MICROSCOPIC
BILIRUBIN URINE: NEGATIVE
KETONES UR: NEGATIVE
LEUKOCYTES UA: NEGATIVE
NITRITE: NEGATIVE
Specific Gravity, Urine: >=1.03 — AB (ref 1.000–1.030)
Urine Glucose: NEGATIVE
Urobilinogen, UA: 0.2 (ref 0.0–1.0)
pH: 6 (ref 5.0–8.0)

## 2018-05-24 LAB — POCT GLYCOSYLATED HEMOGLOBIN (HGB A1C): Hemoglobin A1C: 5.4 % (ref 4.0–5.6)

## 2018-05-24 MED ORDER — LOSARTAN POTASSIUM 25 MG PO TABS: 25.0000 mg | ORAL_TABLET | Freq: Every day | ORAL | 2 refills | Status: DC

## 2018-05-24 MED ORDER — VENLAFAXINE HCL ER 75 MG PO CP24: 75.0000 mg | ORAL_CAPSULE | Freq: Every day | ORAL | 3 refills | Status: DC

## 2018-05-24 MED ORDER — PRAVASTATIN SODIUM 20 MG PO TABS: 20.0000 mg | ORAL_TABLET | Freq: Every day | ORAL | 3 refills | Status: DC

## 2018-05-24 NOTE — Progress Notes (Signed)
HPI:   [name] is a 47 y.o. female, who is here today for 6 months follow up.   She was last seen on 11/27/2017 for acute visit, cutaneous abscess. Last follow-up appointment in 08/2017. Since her last visit she has been treated for pneumonia.  DM 2: Diagnosed around 2006. She is currently on nonpharmacologic treatment. According to the patient, she was diagnosed based on results of a 3 hrs glucose tolerance test.  She used to follow with endocrinologist and took metformin for a few years.  She is not doing fingersticks.  Denies abdominal pain, nausea,vomiting, polydipsia,polyuria, or polyphagia.   Lab Results  Component Value Date   HGBA1C 5.9 09/07/2017   Lab Results  Component Value Date   MICROALBUR 1.5 09/07/2017    Hypertension: Dx around 2014.  She ran out of Cozaar 100 mg and Norvasc 5 mg about a week ago.  BP readings: 90s/70s when taking medications. Last eye exam: A few years ago.  Lab Results  Component Value Date   CREATININE 0.89 09/07/2017   BUN 18 09/07/2017   NA 140 09/07/2017   K 3.9 09/07/2017   CL 105 09/07/2017   CO2 25 09/07/2017   Anxiety and depression: Currently she is on Effexor XR 75 mg daily, she was changed from Zoloft 100 mg last visit.  Zoloft was helping with depression but not helping with anxiety.  In the past she tried Paxil and did counseling. She is reporting symptoms well controlled, depression and anxiety. She denies suicidal thoughts.  Tolerating medication well, denies side effects.  No history of hyperlipidemia. Last lipid panel in 05/2017: HDL 38.5, LDL 92, TC 145, and TG 73.   Today she is reporting gross hematuria every time she drinks a soda, this has been going on for a while and she has not had any episodes since she stopped drinking sodas. Usually happens if she drinks 2 sodas. She has history of nephrolithiasis, she had a lithotripsy in 2017. With episodes she has mild dull left-sided  back pain but no nausea, vomiting, or changes in bowel habits. No associated abdominal pain, dysuria, urinary frequency, or urgency.  Mat uncle and aunt with history of renal cancer.   Former smoker.   Review of Systems  Constitutional: Negative for activity change, appetite change, fatigue, fever and unexpected weight change.  HENT: Negative for mouth sores, nosebleeds and trouble swallowing.   Eyes: Negative for redness and visual disturbance.  Respiratory: Negative for cough, shortness of breath and wheezing.   Cardiovascular: Negative for chest pain, palpitations and leg swelling.  Gastrointestinal: Negative for abdominal pain, nausea and vomiting.       Negative for changes in bowel habits.  Endocrine: Negative for polydipsia, polyphagia and polyuria.  Genitourinary: Negative for decreased urine volume and dysuria.  Musculoskeletal: Negative for gait problem and myalgias.  Skin: Negative for rash and wound.  Neurological: Negative for syncope, weakness, numbness and headaches.  Psychiatric/Behavioral: Negative for confusion and sleep disturbance. The patient is nervous/anxious.       Current Outpatient Medications on File Prior to Visit  Medication Sig Dispense Refill  . albuterol (PROVENTIL HFA;VENTOLIN HFA) 108 (90 Base) MCG/ACT inhaler Inhale 2 puffs into the lungs every 6 (six) hours as needed for wheezing or shortness of breath. 1 Inhaler 0  . ibuprofen (ADVIL,MOTRIN) 600 MG tablet Take 1 tablet (600 mg total) by mouth every 6 (six) hours as needed. 30 tablet 0  . montelukast (SINGULAIR) 10 MG  tablet Take 1 tablet (10 mg total) by mouth at bedtime. 90 tablet 1  . omeprazole (PRILOSEC) 20 MG capsule Take 1 capsule (20 mg total) by mouth daily. 90 capsule 3  . SYMBICORT 160-4.5 MCG/ACT inhaler Inhale 2 puffs into the lungs 2 (two) times daily.  11   No current facility-administered medications on file prior to visit.      Past Medical History:  Diagnosis Date  .  Allergy   . Asthma   . Depression   . Diabetes mellitus 2006  . GERD (gastroesophageal reflux disease)   . Hypertension    No Known Allergies  Social History   Socioeconomic History  . Marital status: Married    Spouse name: Not on file  . Number of children: Not on file  . Years of education: Not on file  . Highest education level: Not on file  Occupational History  . Occupation: Works at [employer]  . Financial resource strain: Not on file  . Food insecurity:    Worry: Not on file    Inability: Not on file  . Transportation needs:    Medical: Not on file    Non-medical: Not on file  Tobacco Use  . Smoking status: Former Smoker    Packs/day: 0.50    Years: 4.00    Pack years: 2.00    Types: Cigarettes    Last attempt to quit: 02/20/2014    Years since quitting: 4.2  . Smokeless tobacco: Never Used  Substance and Sexual Activity  . Alcohol use: No    Alcohol/week: 0.0 oz  . Drug use: No  . Sexual activity: Yes    Birth control/protection: None  Lifestyle  . Physical activity:    Days per week: Not on file    Minutes per session: Not on file  . Stress: Not on file  Relationships  . Social connections:    Talks on phone: Not on file    Gets together: Not on file    Attends religious service: Not on file    Active member of club or organization: Not on file    Attends meetings of clubs or organizations: Not on file    Relationship status: Not on file  Other Topics Concern  . Not on file  Social History Narrative  . Not on file    Vitals:   05/24/18 1041  BP: 100/66  Pulse: 66  Resp: 12  Temp: 99.1 F (37.3 C)  SpO2: 97%   Body mass index is 35.21 kg/m.  Physical Exam  Nursing note and vitals reviewed. Constitutional: She is oriented to person, place, and time. She appears well-developed. No distress.  HENT:  Head: Normocephalic and atraumatic.  Mouth/Throat: Oropharynx is clear and moist and mucous membranes are normal.  Eyes:  Pupils are equal, round, and reactive to light. Conjunctivae are normal.  Cardiovascular: Normal rate and regular rhythm.  No murmur heard. Pulses:      Dorsalis pedis pulses are 2+ on the right side, and 2+ on the left side.  Respiratory: Effort normal and breath sounds normal. No respiratory distress.  GI: Soft. She exhibits no mass. There is no hepatomegaly. There is no tenderness.  Musculoskeletal: She exhibits no edema.  Lymphadenopathy:    She has no cervical adenopathy.  Neurological: She is alert and oriented to person, place, and time. She has normal strength. Coordination normal.  Skin: Skin is warm. No erythema.  Psychiatric: She has a normal mood and  affect.  Well groomed, good eye contact.   Diabetic Foot Exam - Simple   Simple Foot Form Diabetic Foot exam was performed with the following findings:  Yes 05/24/2018 11:44 AM  Visual Inspection No deformities, no ulcerations, no other skin breakdown bilaterally:  Yes Sensation Testing Intact to touch and monofilament testing bilaterally:  Yes Pulse Check Posterior Tibialis and Dorsalis pulse intact bilaterally:  Yes Comments      ASSESSMENT AND PLAN:   [name] was seen today for 6 months follow-up.  Orders Placed This Encounter  Procedures  . Urinalysis, Routine w reflex microscopic  . Basic metabolic panel  . Lipid panel  . POCT glycosylated hemoglobin (Hb A1C)   Lab Results  Component Value Date   HGBA1C 5.4 05/24/2018     Type 2 diabetes mellitus (HCC) HgA1C at goal. Continue nonpharmacologic treatment. Regular exercise and healthy diet with avoidance of added sugar food intake is an important part of treatment and recommended. Annual eye exam, periodic dental and foot care recommended. F/U in 6 months   Essential hypertension She is reporting low BPs. Today BP is adequately controlled, on lower normal range. Recommend resuming Cozaar but a lower dose, 25 mg daily. For now we will not  resume amlodipine. Continue monitoring BPs at home. Follow-up in 6 months, before if needed.  Anxiety disorder Improved. No changes in Effexor XR 75 mg daily. Follow-up in 6 months, before if needed.  Recurrent major depression in remission [facility](HCC) Stay well controlled with Effexor. No changes in current management. Instructed about warning signs. Follow-up in 6 months.   Gross hematuria  We discussed possible etiologies, including benign and more serious process. She has a history of nephrolithiasis and has some dull pain with episodes of gross hematuria. She prefers to hold on imaging as well as urology referral. However with identified trigger factor, disorders. Further recommendation will be given according to UA results. Instructed about warning signs.   She will come back for fasting labs next week.   Betty G. Swazil[place]Jordan, MD  [facility]. [place] office.

## 2018-05-24 NOTE — Assessment & Plan Note (Signed)
Stay well controlled with Effexor. No changes in current management. Instructed about warning signs. Follow-up in 6 months.

## 2018-05-24 NOTE — Assessment & Plan Note (Signed)
She is reporting low BPs. Today BP is adequately controlled, on lower normal range. Recommend resuming Cozaar but a lower dose, 25 mg daily. For now we will not resume amlodipine. Continue monitoring BPs at home. Follow-up in 6 months, before if needed.

## 2018-05-24 NOTE — Assessment & Plan Note (Signed)
Improved. No changes in Effexor XR 75 mg daily. Follow-up in 6 months, before if needed.

## 2018-05-24 NOTE — Assessment & Plan Note (Signed)
HgA1C at goal. Continue nonpharmacologic treatment. Regular exercise and healthy diet with avoidance of added sugar food intake is an important part of treatment and recommended. Annual eye exam, periodic dental and foot care recommended. F/U in 6 months

## 2018-05-24 NOTE — Patient Instructions (Addendum)
A few things to remember from today's visit:   Type 2 diabetes mellitus with other specified complication, without long-term current use of insulin (HCC) - Plan: POCT glycosylated hemoglobin (Hb A1C)  Essential hypertension - Plan: losartan (COZAAR) 25 MG tablet  Anxiety disorder, unspecified type  Gross hematuria - Plan: Urinalysis, Routine w reflex microscopic  We will resume Cozaar lower dose. Blood pressure reading in 4-8 weeks. If blood in urine again please call me.   Please be sure medication list is accurate. If a new problem present, please set up appointment sooner than planned today.

## 2018-06-05 ENCOUNTER — Other Ambulatory Visit: Payer: Self-pay | Admitting: Family Medicine

## 2018-06-05 DIAGNOSIS — R310 Gross hematuria: Secondary | ICD-10-CM

## 2018-06-27 ENCOUNTER — Other Ambulatory Visit: Payer: Self-pay | Admitting: Family Medicine

## 2018-06-27 DIAGNOSIS — J4521 Mild intermittent asthma with (acute) exacerbation: Secondary | ICD-10-CM

## 2018-07-31 ENCOUNTER — Telehealth: Payer: Self-pay | Admitting: Internal Medicine

## 2018-08-01 NOTE — Telephone Encounter (Signed)
Patient is calling about this refill. She states the pharmacy sent it to the wrong provider and it needed to go to Dr. [place]. Please advise.

## 2018-08-01 NOTE — Telephone Encounter (Signed)
I do not see that Dr. [place] has filled this medication in the past.  Please advise.

## 2018-08-02 NOTE — Telephone Encounter (Signed)
Message sent to Dr. Jordan for review and approval. 

## 2018-08-05 ENCOUNTER — Other Ambulatory Visit: Payer: Self-pay | Admitting: Family Medicine

## 2018-08-05 MED ORDER — SYMBICORT 160-4.5 MCG/ACT IN AERO: 2.0000 | INHALATION_SPRAY | Freq: Two times a day (BID) | RESPIRATORY_TRACT | 0 refills | Status: DC

## 2018-08-05 NOTE — Telephone Encounter (Signed)
Prescription for Symbicort 160-4.5 mcg inhaler was sent to her pharmacy. Thanks, BJ

## 2018-08-12 ENCOUNTER — Telehealth: Payer: Self-pay

## 2018-08-12 NOTE — Telephone Encounter (Signed)
Copied from CRM [id number]. Topic: General - Other >> Aug 12, 2018 10:21 AM Terri Richardson wrote: Reason for CRM: Terri Richardson called in she says the medication SYMBICORT 160-4.5 MCG/ACT inhaler is to expensive at the pharmacy and she would like Richardson lease expensive alternative.   Please advise  Spoke with Terri Richardson and she states she has no insurance and even with pharmacy discount Symbicort is almost $300. She would like to know if generic can be sent in please.

## 2018-08-13 ENCOUNTER — Other Ambulatory Visit: Payer: Self-pay | Admitting: Family Medicine

## 2018-08-13 MED ORDER — FLUTICASONE-SALMETEROL 250-50 MCG/DOSE IN AEPB: 1.0000 | INHALATION_SPRAY | Freq: Two times a day (BID) | RESPIRATORY_TRACT | 3 refills | Status: DC

## 2018-08-13 NOTE — Telephone Encounter (Signed)
Spoke with patient and she will find out the costs of the medications and call back.

## 2018-08-13 NOTE — Telephone Encounter (Signed)
Patient calling and states that the pharmacy will not give her pricing of the two medications because they need to know the exact dosage of the medications. Please advise.

## 2018-08-13 NOTE — Telephone Encounter (Signed)
Prescription for Advair 250-50 mcg was sent to the pharmacy to use twice daily. Thanks, BJ

## 2018-08-13 NOTE — Telephone Encounter (Signed)
Options are Advair and Dulera. She can find out which one of these medications is cheaper or wish one her insurance will cover among this group of medications.  Thanks, BJ

## 2018-08-14 NOTE — Telephone Encounter (Signed)
Left message on machine for patient that Rx was sent in.

## 2018-08-30 ENCOUNTER — Encounter: Payer: Self-pay | Admitting: Family Medicine

## 2018-08-30 ENCOUNTER — Ambulatory Visit (INDEPENDENT_AMBULATORY_CARE_PROVIDER_SITE_OTHER): Payer: Self-pay | Admitting: Family Medicine

## 2018-08-30 VITALS — BP 110/80 | HR 78 | Temp 98.2°F | Wt 187.9 lb

## 2018-08-30 DIAGNOSIS — R1031 Right lower quadrant pain: Secondary | ICD-10-CM

## 2018-08-30 DIAGNOSIS — N200 Calculus of kidney: Secondary | ICD-10-CM

## 2018-08-30 LAB — POCT URINALYSIS DIPSTICK
Bilirubin, UA: NEGATIVE
Glucose, UA: NEGATIVE
LEUKOCYTES UA: NEGATIVE
Nitrite, UA: NEGATIVE
PH UA: 6 (ref 5.0–8.0)
PROTEIN UA: POSITIVE — AB
Spec Grav, UA: 1.025 (ref 1.010–1.025)
UROBILINOGEN UA: 0.2 U/dL

## 2018-08-30 MED ORDER — HYDROCODONE-ACETAMINOPHEN 10-325 MG PO TABS: 0.5000 | ORAL_TABLET | Freq: Three times a day (TID) | ORAL | 0 refills | Status: AC | PRN

## 2018-08-30 MED ORDER — KETOROLAC TROMETHAMINE 60 MG/2ML IM SOLN
60.0000 mg | Freq: Once | INTRAMUSCULAR | Status: AC
  Administered 2018-08-30: 60 mg via INTRAMUSCULAR

## 2018-08-30 MED ORDER — TAMSULOSIN HCL 0.4 MG PO CAPS: 0.4000 mg | ORAL_CAPSULE | Freq: Every day | ORAL | 2 refills | Status: DC

## 2018-08-30 NOTE — Progress Notes (Signed)
Terri Richardson DOB: Sep 13, 1971 Encounter date: 08/30/2018  This is a 47 y.o. female who presents with  Chief Complaint  Patient presents with  . Back Pain    right lower  . Abdominal Pain    right lower    Woke up this morning with severe pain right lower back radiating to front of groin. Has had similar pain in past with kidney stone. Chills; no known fever. Has thrown up a few times. Some nausea. Hasn't eaten anything; pain is so bad she can't think of anything else. Pain 6-7/10. Hasn't noted blood in urine.   Ended up getting lithotripsy in past for kidney stones.   On prior CT abd/pelvis there was a lower pole R kidney stone 0.4cm in size  Review of Systems  Constitutional: Negative for chills, fever and malaise/fatigue.  Respiratory: Negative for cough.   Cardiovascular: Negative for chest pain, palpitations and leg swelling.  Gastrointestinal: Positive for vomiting (had 2 episodes this morning). Negative for blood in stool, constipation, diarrhea, melena and nausea.  Genitourinary: Positive for flank pain. Negative for dysuria, frequency, hematuria (not noted) and urgency.      Objective:  BP 110/80 (BP Location: Right Arm, Patient Position: Sitting, Cuff Size: Large)   Pulse 78   Temp 98.2 F (36.8 C) (Oral)   Wt 187 lb 14.4 oz (85.2 kg)   LMP 08/27/2018 (Approximate)   SpO2 95%   BMI 34.37 kg/m   Weight: 187 lb 14.4 oz (85.2 kg)   Physical Exam  Constitutional: She is oriented to person, place, and time. She appears well-developed and well-nourished. No distress.  She is uncomfortable due to stone  Cardiovascular: Normal rate, regular rhythm and normal heart sounds. Exam reveals no friction rub.  No murmur heard. No lower extremity edema  Pulmonary/Chest: Effort normal and breath sounds normal. No respiratory distress. She has no wheezes. She has no rales.  Abdominal: Soft. Normal appearance and bowel sounds are normal. There is no hepatosplenomegaly. There is  CVA tenderness (right). There is no rigidity, no rebound, no guarding, no tenderness at McBurney's point and negative Murphy's sign.  Negative psoas sign  Neurological: She is alert and oriented to person, place, and time.  Psychiatric: Her behavior is normal. Cognition and memory are normal.    Assessment/Plan:  1. Right lower quadrant abdominal pain Exam and history as well as UA positive for blood without leuks point to stone. We discussed options and I think reasonable at this point to work on keeping her more comfortable and trial of flomax to facilitate stone passing. I will check in with her once we get those to see how she is feeling at that point.  We discussed that if she has worsening of pain over the weekend or additional symptoms like fever, vomiting that she should proceed to the ER for evaluation. - POC Urinalysis Dipstick - Urine Culture  2. Kidney stone See above - tamsulosin (FLOMAX) 0.4 MG CAPS capsule; Take 1 capsule (0.4 mg total) by mouth daily.  Dispense: 30 capsule; Refill: 2 - ketorolac (TORADOL) injection 60 mg - HYDROcodone-acetaminophen (NORCO) 10-325 MG tablet; Take 0.5-1 tablets by mouth every 8 (eight) hours as needed for up to 5 days for severe pain.  Dispense: 15 tablet; Refill: 0  Return pending update/culture results.  Theodis Shove, MD

## 2018-08-30 NOTE — Patient Instructions (Addendum)
Faxed referral / notes to [facility] Address: [address] [place], [place] [address] Phone: [phone] Fax [phone] Their office will contact pt to schedule directly and inform our office of appointment   (call their office and say referral was processed but you didn't hear from them).   If worsening of symptoms, please go to the ER.   Flomax as directed, keep up with fluids. OK to take ibuprofen 800mg  three times daily to help with pain. Hydrocodone for more significant pain relief.

## 2018-08-31 LAB — URINE CULTURE
MICRO NUMBER: 91348474
Result:: NO GROWTH
SPECIMEN QUALITY: ADEQUATE

## 2018-09-02 NOTE — Progress Notes (Signed)
ATC patient, mail box was full so unable to leave a message. CRM created.

## 2018-11-25 ENCOUNTER — Other Ambulatory Visit: Payer: Self-pay | Admitting: Family Medicine

## 2018-12-29 ENCOUNTER — Other Ambulatory Visit: Payer: Self-pay | Admitting: Family Medicine

## 2018-12-29 DIAGNOSIS — J4521 Mild intermittent asthma with (acute) exacerbation: Secondary | ICD-10-CM

## 2019-02-16 ENCOUNTER — Other Ambulatory Visit: Payer: Self-pay | Admitting: Family Medicine

## 2019-02-16 DIAGNOSIS — I10 Essential (primary) hypertension: Secondary | ICD-10-CM

## 2019-03-16 ENCOUNTER — Other Ambulatory Visit: Payer: Self-pay | Admitting: Family Medicine

## 2019-03-16 DIAGNOSIS — I10 Essential (primary) hypertension: Secondary | ICD-10-CM

## 2019-03-28 ENCOUNTER — Other Ambulatory Visit: Payer: Self-pay

## 2019-03-28 ENCOUNTER — Encounter: Payer: Self-pay | Admitting: Family Medicine

## 2019-03-28 ENCOUNTER — Ambulatory Visit (INDEPENDENT_AMBULATORY_CARE_PROVIDER_SITE_OTHER): Payer: Self-pay | Admitting: Family Medicine

## 2019-03-28 DIAGNOSIS — F419 Anxiety disorder, unspecified: Secondary | ICD-10-CM

## 2019-03-28 DIAGNOSIS — E1169 Type 2 diabetes mellitus with other specified complication: Secondary | ICD-10-CM

## 2019-03-28 DIAGNOSIS — E785 Hyperlipidemia, unspecified: Secondary | ICD-10-CM

## 2019-03-28 DIAGNOSIS — I10 Essential (primary) hypertension: Secondary | ICD-10-CM

## 2019-03-28 MED ORDER — PRAVASTATIN SODIUM 20 MG PO TABS: 20.0000 mg | ORAL_TABLET | Freq: Every day | ORAL | 2 refills | Status: DC

## 2019-03-28 MED ORDER — LOSARTAN POTASSIUM 25 MG PO TABS: 25.0000 mg | ORAL_TABLET | Freq: Every day | ORAL | 1 refills | Status: DC

## 2019-03-28 NOTE — Progress Notes (Signed)
Virtual Visit via Video Note   I connected with Terri Richardson on 03/29/19 at  2:00 PM EDT by a video enabled telemedicine application and verified that I am speaking with the correct person using two identifiers.  Location patient: home Location provider:work office Persons participating in the virtual visit: patient, provider  I discussed the limitations of evaluation and management by telemedicine and the availability of in person appointments. The patient expressed understanding and agreed to proceed.   HPI: Terri Richardson is a 48 yo female last seen on 05/24/18. No new problems since her last OV.  DM II,has been well controlled with non pharmacologic treatment. She is not checking BS's. Denies abdominal pain, nausea,vomiting, polydipsia,polyuria, or polyphagia.   Lab Results  Component Value Date   HGBA1C 5.4 05/24/2018   Lab Results  Component Value Date   MICROALBUR 1.5 09/07/2017    Hypertension, currently she is on losartan 25 mg daily. She checks her BP periodically, usually 120s/80s. Tolerating medication well. Denies severe/frequent headache, visual changes, chest pain, dyspnea, palpitation, claudication, focal weakness, or edema.  Lab Results  Component Value Date   CREATININE 0.89 09/07/2017   BUN 18 09/07/2017   NA 140 09/07/2017   K 3.9 09/07/2017   CL 105 09/07/2017   CO2 25 09/07/2017   Lab Results  Component Value Date   CHOL 187 01/03/2016   Anxiety and depression, currently she is on Effexor XR 75 mg daily. Medication has helped with symptoms, mainly with anxiety. She denies depressed mood or suicidal thoughts.   Hyperlipidemia: Currently on pravastatin 20 mg daily Following a low fat diet: Not consistently.  She has not noted side effects with medication.  Lab Results  Component Value Date   CHOL 187 01/03/2016    ROS: See pertinent positives and negatives per HPI.  Past Medical History:  Diagnosis Date  . Allergy   . Asthma   . Depression    . Diabetes mellitus 2006  . GERD (gastroesophageal reflux disease)   . Hypertension     Past Surgical History:  Procedure Laterality Date  . APPENDECTOMY  1st grade  . CESAREAN SECTION  2008  . CHOLECYSTECTOMY  11/11/2011   Procedure: LAPAROSCOPIC CHOLECYSTECTOMY WITH INTRAOPERATIVE CHOLANGIOGRAM;  Surgeon: Jetty Duhamel, MD;  Location: [facility] OR;  Service: General;  Laterality: N/A;  . TONSILLECTOMY  6th grade    Family History  Problem Relation Age of Onset  . Hyperlipidemia Mother   . Hypertension Father   . Tongue cancer Maternal Grandfather   . Prostate cancer Paternal Uncle   . Kidney cancer Maternal Uncle   . Heart disease Brother   . Hypertension Brother     Social History   Socioeconomic History  . Marital status: Married    Spouse name: Not on file  . Number of children: Not on file  . Years of education: Not on file  . Highest education level: Not on file  Occupational History  . Occupation: Works at [employer]  . Financial resource strain: Not on file  . Food insecurity:    Worry: Not on file    Inability: Not on file  . Transportation needs:    Medical: Not on file    Non-medical: Not on file  Tobacco Use  . Smoking status: Former Smoker    Packs/day: 0.50    Years: 4.00    Pack years: 2.00    Types: Cigarettes    Last attempt to quit: 02/20/2014  Years since quitting: 5.1  . Smokeless tobacco: Never Used  Substance and Sexual Activity  . Alcohol use: No    Alcohol/week: 0.0 standard drinks  . Drug use: No  . Sexual activity: Yes    Birth control/protection: None  Lifestyle  . Physical activity:    Days per week: Not on file    Minutes per session: Not on file  . Stress: Not on file  Relationships  . Social connections:    Talks on phone: Not on file    Gets together: Not on file    Attends religious service: Not on file    Active member of club or organization: Not on file    Attends meetings of clubs or organizations:  Not on file    Relationship status: Not on file  . Intimate partner violence:    Fear of current or ex partner: Not on file    Emotionally abused: Not on file    Physically abused: Not on file    Forced sexual activity: Not on file  Other Topics Concern  . Not on file  Social History Narrative  . Not on file     Current Outpatient Medications:  .  albuterol (PROVENTIL HFA;VENTOLIN HFA) 108 (90 Base) MCG/ACT inhaler, Inhale 2 puffs into the lungs every 6 (six) hours as needed for wheezing or shortness of breath., Disp: 1 Inhaler, Rfl: 0 .  ibuprofen (ADVIL,MOTRIN) 600 MG tablet, Take 1 tablet (600 mg total) by mouth every 6 (six) hours as needed., Disp: 30 tablet, Rfl: 0 .  losartan (COZAAR) 25 MG tablet, Take 1 tablet (25 mg total) by mouth daily., Disp: 90 tablet, Rfl: 1 .  montelukast (SINGULAIR) 10 MG tablet, TAKE 1 TABLET BY MOUTH AT BEDTIME, Disp: 90 tablet, Rfl: 1 .  omeprazole (PRILOSEC) 20 MG capsule, Take 1 capsule (20 mg total) by mouth daily., Disp: 90 capsule, Rfl: 3 .  pravastatin (PRAVACHOL) 20 MG tablet, Take 1 tablet (20 mg total) by mouth daily., Disp: 90 tablet, Rfl: 2 .  venlafaxine XR (EFFEXOR XR) 75 MG 24 hr capsule, Take 1 capsule (75 mg total) by mouth daily with breakfast., Disp: 90 capsule, Rfl: 3 .  WIXELA INHUB 250-50 MCG/DOSE AEPB, TAKE 1 PUFF BY MOUTH TWICE A DAY, Disp: 60 each, Rfl: 3  EXAM:  VITALS per patient if applicable:BP 120/80   GENERAL: alert, oriented, appears well and in no acute distress  HEENT: atraumatic, conjunctiva clear, no obvious facial abnormalities on inspection.  NECK: normal movements of the head and neck  LUNGS: on inspection no signs of respiratory distress, breathing rate appears normal, no obvious gross SOB, gasping or wheezing  CV: no obvious cyanosis  Terri: moves all visible extremities without noticeable abnormality  PSYCH/NEURO: pleasant and cooperative, no obvious depression or anxiety, speech and thought processing  grossly intact  ASSESSMENT AND PLAN:  Discussed the following assessment and plan:  Orders Placed This Encounter  Procedures  . Microalbumin / creatinine urine ratio  . Comprehensive metabolic panel  . Hemoglobin A1c  . Lipid panel     Type 2 diabetes mellitus (HCC) A1c has been at goal with nonpharmacologic treatment. She will come next week for lab work. Continue annual eye exam and appropriate foot care. Further recommendation will be given according to A1c.  Essential hypertension BP has been adequately controlled, per patient report. Continue losartan 25 mg daily. Continue monitoring BP regularly and following low-salt diet.  Hyperlipidemia associated with type 2 diabetes mellitus (HCC) No  changes in pravastatin 20 mg daily. Low-fat diet also recommended. Further recommendation will be given according to lipid panel results.  Anxiety disorder She is reporting problem as well controlled. No changes in Effexor XR 75 mg daily.     She will come back next week for fasting labs.  I discussed the assessment and treatment plan with the patient. She was provided an opportunity to ask questions and all were answered. The patient agreed with the plan and demonstrated an understanding of the instructions.     Return in about 6 months (around 09/27/2019) for cpe.    Ariella Voit [place], MD

## 2019-03-28 NOTE — Assessment & Plan Note (Signed)
She is reporting problem as well controlled. No changes in Effexor XR 75 mg daily.

## 2019-03-28 NOTE — Assessment & Plan Note (Signed)
BP has been adequately controlled, per patient report. Continue losartan 25 mg daily. Continue monitoring BP regularly and following low-salt diet.

## 2019-03-28 NOTE — Assessment & Plan Note (Signed)
A1c has been at goal with nonpharmacologic treatment. She will come next week for lab work. Continue annual eye exam and appropriate foot care. Further recommendation will be given according to A1c.

## 2019-03-28 NOTE — Assessment & Plan Note (Signed)
No changes in pravastatin 20 mg daily. Low-fat diet also recommended. Further recommendation will be given according to lipid panel results.

## 2019-04-02 ENCOUNTER — Other Ambulatory Visit: Payer: Self-pay

## 2019-04-04 ENCOUNTER — Encounter: Payer: Self-pay | Admitting: Family Medicine

## 2019-04-04 ENCOUNTER — Other Ambulatory Visit: Payer: Self-pay

## 2019-04-04 ENCOUNTER — Ambulatory Visit (INDEPENDENT_AMBULATORY_CARE_PROVIDER_SITE_OTHER): Payer: Self-pay | Admitting: Family Medicine

## 2019-04-04 DIAGNOSIS — L309 Dermatitis, unspecified: Secondary | ICD-10-CM

## 2019-04-04 DIAGNOSIS — M2550 Pain in unspecified joint: Secondary | ICD-10-CM

## 2019-04-04 MED ORDER — CETIRIZINE HCL 10 MG PO TABS: 10.0000 mg | ORAL_TABLET | Freq: Every day | ORAL | 3 refills | Status: DC

## 2019-04-04 NOTE — Progress Notes (Signed)
Virtual Visit via Video Note  I connected with Terri Richardson on 04/04/19 at  9:00 AM EDT by a video enabled telemedicine application and verified that I am speaking with the correct person using two identifiers.  Location patient: home Location provider:work or home office Persons participating in the virtual visit: patient, provider  I discussed the limitations of evaluation and management by telemedicine and the availability of in person appointments. The patient expressed understanding and agreed to proceed.   HPI: Pt with rash/dry skin on face around eyes that is peeling.  Pt states is started a few days ago.  Pt states the irritation is causing eyelid edema.  Has a spot on chest and an area of erythema under L armpit.  Washed white sheets with bleach when the rashes started.  Has since changed detergent and sheets, but thinks skin has become worse.  Pt tried OTC hydrocortisone cream on her face.  Using oil of olay products on face.  Drinking 3- twenty oz bottles of water per day.  Denies changes in foods, soaps, lotions, or meds.  Pt denies increased sun exposure.  Also having joint pain in shoulders, knees, and fingers.  Pt endorses mild edema in hands, unable to completely close fist.    Pt mentions she was suppose to have labs drawn this am in clinic but they were cancelled 2/2 her having a temp of 99.38F.  Pt also feels her bp is slightly elevated.   ROS: See pertinent positives and negatives per HPI.  Past Medical History:  Diagnosis Date  . Allergy   . Asthma   . Depression   . Diabetes mellitus 2006  . GERD (gastroesophageal reflux disease)   . Hypertension     Past Surgical History:  Procedure Laterality Date  . APPENDECTOMY  1st grade  . CESAREAN SECTION  2008  . CHOLECYSTECTOMY  11/11/2011   Procedure: LAPAROSCOPIC CHOLECYSTECTOMY WITH INTRAOPERATIVE CHOLANGIOGRAM;  Surgeon: Belva Crome, MD;  Location: [facility] OR;  Service: General;  Laterality: N/A;  . TONSILLECTOMY   6th grade    Family History  Problem Relation Age of Onset  . Hyperlipidemia Mother   . Hypertension Father   . Tongue cancer Maternal Grandfather   . Prostate cancer Paternal Uncle   . Kidney cancer Maternal Uncle   . Heart disease Brother   . Hypertension Brother      Current Outpatient Medications:  .  albuterol (PROVENTIL HFA;VENTOLIN HFA) 108 (90 Base) MCG/ACT inhaler, Inhale 2 puffs into the lungs every 6 (six) hours as needed for wheezing or shortness of breath., Disp: 1 Inhaler, Rfl: 0 .  ibuprofen (ADVIL,MOTRIN) 600 MG tablet, Take 1 tablet (600 mg total) by mouth every 6 (six) hours as needed., Disp: 30 tablet, Rfl: 0 .  losartan (COZAAR) 25 MG tablet, Take 1 tablet (25 mg total) by mouth daily., Disp: 90 tablet, Rfl: 1 .  montelukast (SINGULAIR) 10 MG tablet, TAKE 1 TABLET BY MOUTH AT BEDTIME, Disp: 90 tablet, Rfl: 1 .  omeprazole (PRILOSEC) 20 MG capsule, Take 1 capsule (20 mg total) by mouth daily., Disp: 90 capsule, Rfl: 3 .  pravastatin (PRAVACHOL) 20 MG tablet, Take 1 tablet (20 mg total) by mouth daily., Disp: 90 tablet, Rfl: 2 .  venlafaxine XR (EFFEXOR XR) 75 MG 24 hr capsule, Take 1 capsule (75 mg total) by mouth daily with breakfast., Disp: 90 capsule, Rfl: 3 .  WIXELA INHUB 250-50 MCG/DOSE AEPB, TAKE 1 PUFF BY MOUTH TWICE A DAY, Disp:  60 each, Rfl: 3  EXAM:  VITALS per patient if applicable:  Temp 99.1  BP 124/89  GENERAL: alert, oriented, appears well and in no acute distress  HEENT: atraumatic, conjunctiva clear, no obvious abnormalities on inspection of external nose and ears  NECK: normal movements of the head and neck  LUNGS: on inspection no signs of respiratory distress, breathing rate appears normal, no obvious gross SOB, gasping or wheezing  CV: no obvious cyanosis  SKIN:  Mild erythema of face periorbital and on forehead with peeling dry skin.  Mild eyelid edema R>L.   L arm pit with mild erythema and small abrasion though difficult to see on  camera.    MS: moves all visible extremities without noticeable abnormality  PSYCH/NEURO: pleasant and cooperative, no obvious depression or anxiety, speech and thought processing grossly intact  ASSESSMENT AND PLAN:  Discussed the following assessment and plan:  Dermatitis  -discussed using gentle cleansers, lotions, etc on skin -hydration encouraged. -ok to use cortisone cream prn, aloe vera - Plan: cetirizine (ZYRTEC) 10 MG tablet -consider CMP  Arthralgia of multiple joints -discussed carious causes.  arthritis, autoimmune d/o -pt advised to have labs done next wk if able.  Consider CBC, GC, autoimmune labs.  Pt to f/u next wk with PCP for further eval of rash and bp check.   I discussed the assessment and treatment plan with the patient. The patient was provided an opportunity to ask questions and all were answered. The patient agreed with the plan and demonstrated an understanding of the instructions.   The patient was advised to call back or seek an in-person evaluation if the symptoms worsen or if the condition fails to improve as anticipated.  Deeann SaintShannon R Macee Venables, MD

## 2019-04-07 ENCOUNTER — Telehealth: Payer: Self-pay | Admitting: General Practice

## 2019-04-07 ENCOUNTER — Ambulatory Visit (INDEPENDENT_AMBULATORY_CARE_PROVIDER_SITE_OTHER): Payer: Self-pay | Admitting: Family Medicine

## 2019-04-07 ENCOUNTER — Encounter: Payer: Self-pay | Admitting: Family Medicine

## 2019-04-07 ENCOUNTER — Other Ambulatory Visit: Payer: Self-pay

## 2019-04-07 VITALS — BP 118/70 | HR 92 | Temp 98.5°F | Resp 16 | Ht 62.0 in | Wt 189.0 lb

## 2019-04-07 DIAGNOSIS — Z20828 Contact with and (suspected) exposure to other viral communicable diseases: Secondary | ICD-10-CM

## 2019-04-07 DIAGNOSIS — J029 Acute pharyngitis, unspecified: Secondary | ICD-10-CM

## 2019-04-07 DIAGNOSIS — L298 Other pruritus: Secondary | ICD-10-CM

## 2019-04-07 DIAGNOSIS — Z20822 Contact with and (suspected) exposure to covid-19: Secondary | ICD-10-CM

## 2019-04-07 MED ORDER — KETOCONAZOLE 2 % EX CREA: 1.0000 "application " | TOPICAL_CREAM | Freq: Every day | CUTANEOUS | 1 refills | Status: DC

## 2019-04-07 MED ORDER — TRIAMCINOLONE ACETONIDE 0.1 % EX CREA: 1.0000 "application " | TOPICAL_CREAM | Freq: Two times a day (BID) | CUTANEOUS | 0 refills | Status: DC

## 2019-04-07 MED ORDER — KETOCONAZOLE 2 % EX SHAM: 1.0000 "application " | MEDICATED_SHAMPOO | CUTANEOUS | 0 refills | Status: DC

## 2019-04-07 MED ORDER — MAGIC MOUTHWASH W/LIDOCAINE: 5.0000 mL | Freq: Three times a day (TID) | ORAL | 0 refills | Status: AC | PRN

## 2019-04-07 NOTE — Progress Notes (Signed)
HPI:   Terri Richardson is a 48 y.o. female, who is here today to follow on recent OV.  She was seen on 04/04/2019 because of pruritic, erythematous rash. She has been taking Zyrtec as recommended but it has not helped with pruritus. Problem is also affecting the scalp, it "itches a lot." She denies any new medication, detergent, soap, or body product.  No known insect bite or outdoor exposures to plants. No sick contact, including COVID-19. No Hx of eczema or similar rash in the past. She has history of allergy rhinitis and asthma.  She has had some nonproductive cough, she states that is not more than her usual.  Arthralgias of upper extremities (MCP, wrist, elbows, and shoulders bilateral) and hips. She has not noted joint edema or erythema, no limitation of range of motion.  Fever of 101.7 F 4 days ago. Moderate to severe sore throat, negative for dysphasia or stridor.  She is also reporting decrease of smell and taste. OTC medication for this problem: Cetirizine.    She denies oral lesions/edema wheezing, dyspnea, abdominal pain, nausea, or vomiting.  Review of Systems  Constitutional: Negative for appetite change, chills and fatigue (No more than usual.).  HENT: Negative for congestion, facial swelling, rhinorrhea and voice change.   Eyes: Negative for redness.  Cardiovascular: Negative for chest pain and leg swelling.  Genitourinary: Negative for decreased urine volume, dysuria and hematuria.  Skin: Negative for wound.  Allergic/Immunologic: Positive for environmental allergies.  Neurological: Negative for weakness and headaches.  Hematological: Negative for adenopathy. Does not bruise/bleed easily.  Psychiatric/Behavioral: Negative for confusion. The patient is nervous/anxious.   Rest see pertinent positives and negatives per HPI.   Current Outpatient Medications on File Prior to Visit  Medication Sig Dispense Refill  . albuterol (PROVENTIL  HFA;VENTOLIN HFA) 108 (90 Base) MCG/ACT inhaler Inhale 2 puffs into the lungs every 6 (six) hours as needed for wheezing or shortness of breath. 1 Inhaler 0  . cetirizine (ZYRTEC) 10 MG tablet Take 1 tablet (10 mg total) by mouth daily. 30 tablet 3  . ibuprofen (ADVIL,MOTRIN) 600 MG tablet Take 1 tablet (600 mg total) by mouth every 6 (six) hours as needed. 30 tablet 0  . losartan (COZAAR) 25 MG tablet Take 1 tablet (25 mg total) by mouth daily. 90 tablet 1  . montelukast (SINGULAIR) 10 MG tablet TAKE 1 TABLET BY MOUTH AT BEDTIME 90 tablet 1  . omeprazole (PRILOSEC) 20 MG capsule Take 1 capsule (20 mg total) by mouth daily. 90 capsule 3  . pravastatin (PRAVACHOL) 20 MG tablet Take 1 tablet (20 mg total) by mouth daily. 90 tablet 2  . venlafaxine XR (EFFEXOR XR) 75 MG 24 hr capsule Take 1 capsule (75 mg total) by mouth daily with breakfast. 90 capsule 3  . WIXELA INHUB 250-50 MCG/DOSE AEPB TAKE 1 PUFF BY MOUTH TWICE A DAY 60 each 3   No current facility-administered medications on file prior to visit.      Past Medical History:  Diagnosis Date  . Allergy   . Asthma   . Depression   . Diabetes mellitus 2006  . GERD (gastroesophageal reflux disease)   . Hypertension    No Known Allergies  Social History   Socioeconomic History  . Marital status: Married    Spouse name: Not on file  . Number of children: Not on file  . Years of education: Not on file  . Highest education level: Not on file  Occupational History  . Occupation: Works at [employer]  Social Needs  . Financial resource strain: Not on file  . Food insecurity    Worry: Not on file    Inability: Not on file  . Transportation needs    Medical: Not on file    Non-medical: Not on file  Tobacco Use  . Smoking status: Former Smoker    Packs/day: 0.50    Years: 4.00    Pack years: 2.00    Types: Cigarettes    Quit date: 02/20/2014    Years since quitting: 5.1  . Smokeless tobacco: Never Used  Substance and Sexual  Activity  . Alcohol use: No    Alcohol/week: 0.0 standard drinks  . Drug use: No  . Sexual activity: Yes    Birth control/protection: None  Lifestyle  . Physical activity    Days per week: Not on file    Minutes per session: Not on file  . Stress: Not on file  Relationships  . Social Musicianconnections    Talks on phone: Not on file    Gets together: Not on file    Attends religious service: Not on file    Active member of club or organization: Not on file    Attends meetings of clubs or organizations: Not on file    Relationship status: Not on file  Other Topics Concern  . Not on file  Social History Narrative  . Not on file    Vitals:   04/07/19 1054  BP: 118/70  Pulse: 92  Resp: 16  Temp: 98.5 F (36.9 C)  SpO2: 97%   Body mass index is 34.57 kg/m.   Physical Exam  Constitutional: She is oriented to person, place, and time. She appears well-developed. No distress.  HENT:  Head: Normocephalic and atraumatic.  Mouth/Throat: Uvula is midline and mucous membranes are normal. Posterior oropharyngeal erythema present.  Eyes: Conjunctivae are normal.  Cardiovascular: Normal rate and regular rhythm.  No murmur heard. Respiratory: Effort normal and breath sounds normal. No respiratory distress.  GI: Soft. She exhibits no mass. There is no hepatomegaly. There is no abdominal tenderness.  Musculoskeletal:        General: No edema.     Right shoulder: She exhibits no deformity.     Comments: No significant limitation of range of motion of affected joints. No tenderness upon palpation of MCP, wrist, and elbows bilateral. No signs of synovitis.  Lymphadenopathy:    She has cervical adenopathy.       Right cervical: Posterior cervical adenopathy present.  Neurological: She is alert and oriented to person, place, and time.  Skin: Skin is warm. Rash noted.     Erythematous ,dry,fine scaly rash in between eyebrows, anterior aspect of neck, in between breast, inner thighs, and  lower back. Scaly areas on scalp.  Psychiatric: She has a normal mood and affect.  Well groomed, good eye contact.    ASSESSMENT AND PLAN:  Ms [name] was seen today because rash and sore throat.  Orders Placed This Encounter  Procedures  . Culture, Group A Strep   Suspected Covid-19 Virus Infection Because sore throat, fever, and changes in taste/smell, COVID-19 testing will be arranged. Recommend social distancing. We will determine quarantine time depending of lab results. Clearly instructed about warning signs.  -     Novel Coronavirus, NAA ([facility]); Future  Pruritic erythematous rash I do not think rash is related to fever and arthralgias, distribution suggest seborrheic dermatitis. Educated about diagnosis  and treatment options. If rash is persistent, we need to consider dermatology referral.  -     triamcinolone cream (KENALOG) 0.1 %; Apply 1 application topically 2 (two) times daily. -     ketoconazole (NIZORAL) 2 % shampoo; Apply 1 application topically 2 (two) times a week. -     ketoconazole (NIZORAL) 2 % cream; Apply 1 application topically daily.  Sore throat Rapid strep here in the office negative. We will follow throat culture. Symptomatic treatment with Magic mouthwash with lidocaine gargles recommended. Instructed about warning signs.  -     magic mouthwash w/lidocaine SOLN; Take 5 mLs by mouth 3 (three) times daily as needed for up to 10 days for mouth pain. 50 ml of diphenhydramine, alum and mag hydroxide, and lidocaine to make 150 ml -     Culture, Group A Strep    Return if symptoms worsen or fail to improve.   Kieana Livesay G. [place], MD  [facility]. [place] office.

## 2019-04-07 NOTE — Telephone Encounter (Signed)
Pt has been scheduled for covid-19 testing.   Scheduled with pt directly.  Pt was referred by : Martinique, Betty G, MD

## 2019-04-07 NOTE — Addendum Note (Signed)
Addended by: Dimple Nanas on: 04/07/2019 01:07 PM   Modules accepted: Orders

## 2019-04-07 NOTE — Patient Instructions (Addendum)
  A few things to remember from today's visit:   Suspected Covid-19 Virus Infection - Plan: Novel Coronavirus, NAA ([facility]),   Pruritic erythematous rash - Plan: triamcinolone cream (KENALOG) 0.1 %, ketoconazole (NIZORAL) 2 % shampoo, ketoconazole (NIZORAL) 2 % cream,   Sore throat - Plan: magic mouthwash w/lidocaine SOLN,    Please be sure medication list is accurate. If a new problem present, please set up appointment sooner than planned today.          Seborrheic Dermatitis, Adult Seborrheic dermatitis is a skin disease that causes red, scaly patches. It usually occurs on the scalp, and it is often called dandruff. The patches may appear on other parts of the body. Skin patches tend to appear where there are many oil glands in the skin. Areas of the body that are commonly affected include:  Scalp.  Skin folds of the body.  Ears.  Eyebrows.  Neck.  Face.  Armpits.  The bearded area of men's faces. The condition may come and go for no known reason, and it is often long-lasting (chronic). What are the causes? The cause of this condition is not known. What increases the risk? This condition is more likely to develop in people who:  Have certain conditions, such as: ? HIV (human immunodeficiency virus). ? AIDS (acquired immunodeficiency syndrome). ? Parkinson disease. ? Mood disorders, such as depression.  Are 71-56 years old. What are the signs or symptoms? Symptoms of this condition include:  Thick scales on the scalp.  Redness on the face or in the armpits.  Skin that is flaky. The flakes may be white or yellow.  Skin that seems oily or dry but is not helped with moisturizers.  Itching or burning in the affected areas. How is this diagnosed? This condition is diagnosed with a medical history and physical exam. A sample of your skin may be tested (skin biopsy). You may need to see a skin specialist (dermatologist). How is this treated? There is no  cure for this condition, but treatment can help to manage the symptoms. You may get treatment to remove scales, lower the risk of skin infection, and reduce swelling or itching. Treatment may include:  Creams that reduce swelling and irritation (steroids).  Creams that reduce skin yeast.  Medicated shampoo, soaps, moisturizing creams, or ointments.  Medicated moisturizing creams or ointments. Follow these instructions at home:  Apply over-the-counter and prescription medicines only as told by your health care provider.  Use any medicated shampoo, soaps, skin creams, or ointments only as told by your health care provider.  Keep all follow-up visits as told by your health care provider. This is important. Contact a health care provider if:  Your symptoms do not improve with treatment.  Your symptoms get worse.  You have new symptoms. This information is not intended to replace advice given to you by your health care provider. Make sure you discuss any questions you have with your health care provider. Document Released: 10/09/2005 Document Revised: 04/28/2016 Document Reviewed: 01/27/2016 Elsevier Interactive Patient Education  2019 [employer].

## 2019-04-07 NOTE — Telephone Encounter (Signed)
-----   Message from Betty G Martinique, MD sent at 04/07/2019 11:30 AM EDT ----- Having sore throat, had fever last week (101.7 F and reporting decreased taste and smelling sensation. No hx of exposure.  Thanks, Betty Martinique, MD

## 2019-04-09 LAB — CULTURE, GROUP A STREP
MICRO NUMBER:: 569790
SPECIMEN QUALITY:: ADEQUATE

## 2019-04-09 LAB — NOVEL CORONAVIRUS, NAA: SARS-CoV-2, NAA: NOT DETECTED

## 2019-04-14 ENCOUNTER — Other Ambulatory Visit: Payer: Self-pay | Admitting: Family Medicine

## 2019-04-18 ENCOUNTER — Telehealth: Payer: Self-pay | Admitting: *Deleted

## 2019-04-18 NOTE — Telephone Encounter (Signed)
Results faxed to employer as requested

## 2019-04-18 NOTE — Telephone Encounter (Signed)
Copied from [place] [id number]. Topic: Medical Record Request - Provider/Facility Request >> Apr 17, 2019 10:38 AM Erick Blinks wrote: Patient Name/DOB/MRN #: Terri Richardson  [birth year]  [id number] Fax Number #: (to the attention of [facility]) [phone]  Information Requested: Copy of Negative Covid 19 lab results    Route to [facility] for [place] clinics. For all other clinics, route to the clinic's PEC Pool. >> Apr 18, 2019  8:08 AM Leward Quan A wrote: Patient called back to inquire about copy of COVID 19 test results to be faxed  To her place of employment. Per previous note  >> Apr 17, 2019  3:30 PM Rayann Heman wrote: Pt calling to check status.pt is trying to return to work tomorrow 04/18/19.

## 2019-05-16 ENCOUNTER — Other Ambulatory Visit: Payer: Self-pay | Admitting: Family Medicine

## 2019-05-16 DIAGNOSIS — F419 Anxiety disorder, unspecified: Secondary | ICD-10-CM

## 2019-07-07 ENCOUNTER — Other Ambulatory Visit: Payer: Self-pay | Admitting: Family Medicine

## 2019-07-07 DIAGNOSIS — J4521 Mild intermittent asthma with (acute) exacerbation: Secondary | ICD-10-CM

## 2019-07-25 ENCOUNTER — Other Ambulatory Visit: Payer: Self-pay | Admitting: Family Medicine

## 2019-08-04 ENCOUNTER — Other Ambulatory Visit: Payer: Self-pay

## 2019-08-04 ENCOUNTER — Encounter: Payer: Self-pay | Admitting: Family Medicine

## 2019-08-04 ENCOUNTER — Ambulatory Visit (INDEPENDENT_AMBULATORY_CARE_PROVIDER_SITE_OTHER): Payer: Self-pay | Admitting: Family Medicine

## 2019-08-04 VITALS — BP 124/82 | HR 67 | Temp 98.6°F | Resp 12 | Ht 62.0 in | Wt 195.1 lb

## 2019-08-04 DIAGNOSIS — J4520 Mild intermittent asthma, uncomplicated: Secondary | ICD-10-CM

## 2019-08-04 DIAGNOSIS — E785 Hyperlipidemia, unspecified: Secondary | ICD-10-CM

## 2019-08-04 DIAGNOSIS — I10 Essential (primary) hypertension: Secondary | ICD-10-CM

## 2019-08-04 DIAGNOSIS — E1169 Type 2 diabetes mellitus with other specified complication: Secondary | ICD-10-CM

## 2019-08-04 LAB — LIPID PANEL
Cholesterol: 133 mg/dL (ref 0–200)
HDL: 59.6 mg/dL (ref >39.00)
LDL Cholesterol: 53 mg/dL (ref 0–99)
NonHDL: 73.22
Total CHOL/HDL Ratio: 2
Triglycerides: 100 mg/dL (ref 0.0–149.0)
VLDL: 20 mg/dL (ref 0.0–40.0)

## 2019-08-04 LAB — COMPREHENSIVE METABOLIC PANEL
ALT: 13 U/L (ref 0–35)
AST: 12 U/L (ref 0–37)
Albumin: 4.4 g/dL (ref 3.5–5.2)
Alkaline Phosphatase: 48 U/L (ref 39–117)
BUN: 18 mg/dL (ref 6–23)
CO2: 25 mEq/L (ref 19–32)
Calcium: 9.5 mg/dL (ref 8.4–10.5)
Chloride: 105 mEq/L (ref 96–112)
Creatinine, Ser: 0.9 mg/dL (ref 0.40–1.20)
GFR: 67.23 mL/min (ref >60.00)
Glucose, Bld: 99 mg/dL (ref 70–99)
Potassium: 4.2 mEq/L (ref 3.5–5.1)
Sodium: 139 mEq/L (ref 135–145)
Total Bilirubin: 0.6 mg/dL (ref 0.2–1.2)
Total Protein: 6.9 g/dL (ref 6.0–8.3)

## 2019-08-04 LAB — HEMOGLOBIN A1C: Hgb A1c MFr Bld: 6.3 % (ref 4.6–6.5)

## 2019-08-04 LAB — CBC WITH DIFFERENTIAL/PLATELET
Basophils Absolute: 0.1 10*3/uL (ref 0.0–0.1)
Basophils Relative: 0.8 % (ref 0.0–3.0)
Eosinophils Absolute: 1.2 10*3/uL — ABNORMAL HIGH (ref 0.0–0.7)
Eosinophils Relative: 12.4 % — ABNORMAL HIGH (ref 0.0–5.0)
HCT: 43.5 % (ref 36.0–46.0)
Hemoglobin: 14.4 g/dL (ref 12.0–15.0)
Lymphocytes Relative: 28.9 % (ref 12.0–46.0)
Lymphs Abs: 2.8 10*3/uL (ref 0.7–4.0)
MCHC: 33.2 g/dL (ref 30.0–36.0)
MCV: 92.6 fl (ref 78.0–100.0)
Monocytes Absolute: 0.6 10*3/uL (ref 0.1–1.0)
Monocytes Relative: 6 % (ref 3.0–12.0)
Neutro Abs: 4.9 10*3/uL (ref 1.4–7.7)
Neutrophils Relative %: 51.9 % (ref 43.0–77.0)
Platelets: 287 10*3/uL (ref 150.0–400.0)
RBC: 4.7 Mil/uL (ref 3.87–5.11)
RDW: 13.2 % (ref 11.5–15.5)
WBC: 9.5 10*3/uL (ref 4.0–10.5)

## 2019-08-04 LAB — MICROALBUMIN / CREATININE URINE RATIO
Creatinine,U: 135 mg/dL
Microalb Creat Ratio: 12.8 mg/g (ref 0.0–30.0)
Microalb, Ur: 17.3 mg/dL — ABNORMAL HIGH (ref 0.0–1.9)

## 2019-08-04 MED ORDER — FLUTICASONE-SALMETEROL 250-50 MCG/DOSE IN AEPB: INHALATION_SPRAY | RESPIRATORY_TRACT | 6 refills | Status: DC

## 2019-08-04 NOTE — Progress Notes (Signed)
HPI:   Terri Richardson is a 48 y.o. female, who is here today for chronic disease management. She was last seen for acute visit on 04/07/2019.  DM2: Dx'ed in 2006.She has been checking BS. Currently she is on nonpharmacologic treatment.  Denies abdominal pain, nausea,vomiting, polydipsia,polyuria, or polyphagia.   Lab Results  Component Value Date   HGBA1C 5.4 05/24/2018   Lab Results  Component Value Date   MICROALBUR 1.5 09/07/2017   Hypertension: Currently she is on losartan 25 mg daily. She is not checking BP at home. Denies severe/frequent headache, visual changes, chest pain, dyspnea, palpitation, claudication, focal weakness, or edema.  Lab Results  Component Value Date   CREATININE 0.89 09/07/2017   BUN 18 09/07/2017   NA 140 09/07/2017   K 3.9 09/07/2017   CL 105 09/07/2017   CO2 25 09/07/2017   Elevated WBC's, she was acutely sick with respiratory symptoms, resolved.  Lab Results  Component Value Date   WBC 19.6 Repeated and verified X2. (HH) 04/17/2018   HGB 12.8 04/17/2018   HCT 38.8 04/17/2018   MCV 92.6 04/17/2018   PLT 291.0 04/17/2018   HLD: Currently she is on pravastatin 20 mg daily. She is tolerating the medication well.  Lab Results  Component Value Date   CHOL 187 01/03/2016   Asthma: Currently she is on Singulair 10 mg daily, albuterol inhaler 2 puffs every 4-6 hours as needed, and Wixela 250-50 mcg bid. She needs albuterol inh about 2 times per month. Occasional wheezing. She uses albuterol about once or twice per month. Currently she is not having cough, wheezing, or dyspnea.  Review of Systems  Constitutional: Negative for activity change, appetite change and fatigue.  HENT: Negative for mouth sores, nosebleeds and trouble swallowing.   Eyes: Negative for pain and redness.  Respiratory: Negative for chest tightness and stridor.   Gastrointestinal: Negative for blood in stool.       Negative for changes in bowel  habits.  Genitourinary: Negative for decreased urine volume, dysuria and hematuria.  Musculoskeletal: Negative for gait problem and myalgias.  Allergic/Immunologic: Positive for environmental allergies.  Neurological: Negative for syncope, facial asymmetry and numbness.  Psychiatric/Behavioral: Negative for confusion. The patient is nervous/anxious.   Rest see pertinent positives and negatives per HPI.   Current Outpatient Medications on File Prior to Visit  Medication Sig Dispense Refill  . albuterol (PROVENTIL HFA;VENTOLIN HFA) 108 (90 Base) MCG/ACT inhaler Inhale 2 puffs into the lungs every 6 (six) hours as needed for wheezing or shortness of breath. 1 Inhaler 0  . cetirizine (ZYRTEC) 10 MG tablet Take 1 tablet (10 mg total) by mouth daily. 30 tablet 3  . ibuprofen (ADVIL,MOTRIN) 600 MG tablet Take 1 tablet (600 mg total) by mouth every 6 (six) hours as needed. 30 tablet 0  . ketoconazole (NIZORAL) 2 % cream Apply 1 application topically daily. 30 g 1  . ketoconazole (NIZORAL) 2 % shampoo Apply 1 application topically 2 (two) times a week. 120 mL 0  . losartan (COZAAR) 25 MG tablet Take 1 tablet (25 mg total) by mouth daily. 90 tablet 1  . montelukast (SINGULAIR) 10 MG tablet TAKE 1 TABLET BY MOUTH EVERYDAY AT BEDTIME 90 tablet 1  . omeprazole (PRILOSEC) 20 MG capsule Take 1 capsule (20 mg total) by mouth daily. 90 capsule 3  . pravastatin (PRAVACHOL) 20 MG tablet Take 1 tablet (20 mg total) by mouth daily. 90 tablet 2  . triamcinolone cream (KENALOG) 0.1 %  Apply 1 application topically 2 (two) times daily. 45 g 0  . venlafaxine XR (EFFEXOR-XR) 75 MG 24 hr capsule TAKE 1 CAPSULE BY MOUTH DAILY WITH BREAKFAST. 90 capsule 2   No current facility-administered medications on file prior to visit.      Past Medical History:  Diagnosis Date  . Allergy   . Asthma   . Depression   . Diabetes mellitus 2006  . GERD (gastroesophageal reflux disease)   . Hypertension    No Known Allergies   Social History   Socioeconomic History  . Marital status: Married    Spouse name: Not on file  . Number of children: Not on file  . Years of education: Not on file  . Highest education level: Not on file  Occupational History  . Occupation: Works at [employer]  . Financial resource strain: Not on file  . Food insecurity    Worry: Not on file    Inability: Not on file  . Transportation needs    Medical: Not on file    Non-medical: Not on file  Tobacco Use  . Smoking status: Former Smoker    Packs/day: 0.50    Years: 4.00    Pack years: 2.00    Types: Cigarettes    Quit date: 02/20/2014    Years since quitting: 5.4  . Smokeless tobacco: Never Used  Substance and Sexual Activity  . Alcohol use: No    Alcohol/week: 0.0 standard drinks  . Drug use: No  . Sexual activity: Yes    Birth control/protection: None  Lifestyle  . Physical activity    Days per week: Not on file    Minutes per session: Not on file  . Stress: Not on file  Relationships  . Social Musician on phone: Not on file    Gets together: Not on file    Attends religious service: Not on file    Active member of club or organization: Not on file    Attends meetings of clubs or organizations: Not on file    Relationship status: Not on file  Other Topics Concern  . Not on file  Social History Narrative  . Not on file    Vitals:   08/04/19 0935  BP: 124/82  Pulse: 67  Resp: 12  Temp: 98.6 F (37 C)  SpO2: 97%   Body mass index is 35.69 kg/m.  Physical Exam  Nursing note and vitals reviewed. Constitutional: She is oriented to person, place, and time. She appears well-developed. No distress.  HENT:  Head: Normocephalic and atraumatic.  Mouth/Throat: Oropharynx is clear and moist and mucous membranes are normal.  Eyes: Pupils are equal, round, and reactive to light. Conjunctivae are normal.  Cardiovascular: Normal rate and regular rhythm.  No murmur heard. Pulses:       Dorsalis pedis pulses are 2+ on the right side and 2+ on the left side.  Respiratory: Effort normal. No respiratory distress. She has wheezes (sporadic with force expiration.).  GI: Soft. She exhibits no mass. There is no hepatomegaly. There is no abdominal tenderness.  Musculoskeletal:        General: No edema.  Lymphadenopathy:    She has no cervical adenopathy.  Neurological: She is alert and oriented to person, place, and time. She has normal strength. No cranial nerve deficit. Gait normal.  Skin: Skin is warm. No rash noted. No erythema.  Psychiatric:  Flat mood. Well groomed, good eye contact.  ASSESSMENT AND PLAN:  Ms. Charleston was seen today for medication refill.  Diagnoses and all orders for this visit:  Orders Placed This Encounter  Procedures  . CBC with Differential/Platelet  . Comprehensive metabolic panel  . Hemoglobin A1c  . Lipid panel  . Microalbumin / creatinine urine ratio   Lab Results  Component Value Date   HGBA1C 6.3 08/04/2019   Lab Results  Component Value Date   MICROALBUR 17.3 (H) 08/04/2019   Lab Results  Component Value Date   ALT 13 08/04/2019   AST 12 08/04/2019   ALKPHOS 48 08/04/2019   BILITOT 0.6 08/04/2019   Lab Results  Component Value Date   CREATININE 0.90 08/04/2019   BUN 18 08/04/2019   NA 139 08/04/2019   K 4.2 08/04/2019   CL 105 08/04/2019   CO2 25 08/04/2019   Lab Results  Component Value Date   CHOL 133 08/04/2019   HDL 59.60 08/04/2019   LDLCALC 53 08/04/2019   TRIG 100.0 08/04/2019   CHOLHDL 2 08/04/2019   Lab Results  Component Value Date   WBC 9.5 08/04/2019   HGB 14.4 08/04/2019   HCT 43.5 08/04/2019   MCV 92.6 08/04/2019   PLT 287.0 08/04/2019    Type 2 diabetes mellitus with other specified complication, without long-term current use of insulin ([place]) HgA1C has been at goal. Continue nonpharmacologic treatment. Regular exercise and healthy diet with avoidance of added sugar food intake is an  important part of treatment and recommended. Annual eye exam, periodic dental and foot care recommended. F/U in 5-6 months  -     CBC with Differential/Platelet -     Comprehensive metabolic panel -     Hemoglobin A1c -     Microalbumin / creatinine urine ratio  Hyperlipidemia associated with type 2 diabetes mellitus (HCC) Continue pravastatin 20 mg daily. Further recommendation will be given according to lipid panel results.  Essential hypertension BP adequately controlled. Recommend monitoring BP at home. Continue low-salt diet. No changes in current management.  Mild intermittent reactive airway disease with wheezing without complication Problem is better control. Sporadic wheezing heard today during auscultation. Continue albuterol 2 puffs every 4-6 hours as needed. No changes in Wixela 250-50 mcg twice daily. Continue Singulair 10 mg daily. Instructed about warning signs.  -     Fluticasone-Salmeterol ([place]) 250-50 MCG/DOSE AEPB; INHALE 1 PUFF BY MOUTH TWICE A DAY   Return in about 6 months (around 02/02/2020) for cpe.   -Ms. Salaya Holtrop was advised to return sooner than planned today if new concerns arise.   Jamesa Tedrick G. Martinique, MD  [facility]. [place] office.

## 2019-08-04 NOTE — Patient Instructions (Signed)
A few things to remember from today's visit:   Type 2 diabetes mellitus with other specified complication, without long-term current use of insulin ([place]) - Plan: CBC with Differential/Platelet, Comprehensive metabolic panel, Hemoglobin A1c, Microalbumin / creatinine urine ratio  Hyperlipidemia associated with type 2 diabetes mellitus ([place]) - Plan: Lipid panel  Essential hypertension - Plan: Comprehensive metabolic panel  Mild intermittent reactive airway disease with wheezing without complication - Plan: Fluticasone-Salmeterol (WIXELA INHUB) 250-50 MCG/DOSE AEPB  Today no changes or new medications. You can schedule a 27-month follow-up for a physical. Please a schedule appointment with your gynecologist, you are overdue for a Pap smear. You can go to the breast center for the mammogram, I am placing an order today.  You also need an eye exam.  Please be sure medication list is accurate. If a new problem present, please set up appointment sooner than planned today.

## 2019-08-11 ENCOUNTER — Other Ambulatory Visit: Payer: Self-pay | Admitting: Family Medicine

## 2019-08-11 ENCOUNTER — Telehealth: Payer: Self-pay | Admitting: Family Medicine

## 2019-08-11 DIAGNOSIS — J4520 Mild intermittent asthma, uncomplicated: Secondary | ICD-10-CM

## 2019-08-11 DIAGNOSIS — I10 Essential (primary) hypertension: Secondary | ICD-10-CM

## 2019-08-11 MED ORDER — FLUTICASONE-SALMETEROL 250-50 MCG/DOSE IN AEPB: INHALATION_SPRAY | RESPIRATORY_TRACT | 6 refills | Status: DC

## 2019-08-11 NOTE — Telephone Encounter (Signed)
Patient calling and states that the Fluticasone-Salmeterol [facility] INHUB) 250-50 MCG/DOSE AEPB  Was not at her pharmacy. States that she just got off the phone with them and they did not have a prescription for her. States that she would like the prescription sent to [facility] # [place] due to it being cheaper there. Please advise

## 2019-08-11 NOTE — Telephone Encounter (Signed)
Rx has been sent to the correct pharmacy. Nothing further needed.

## 2019-12-01 ENCOUNTER — Other Ambulatory Visit: Payer: Self-pay | Admitting: Family Medicine

## 2019-12-01 DIAGNOSIS — J4521 Mild intermittent asthma with (acute) exacerbation: Secondary | ICD-10-CM

## 2019-12-01 DIAGNOSIS — F419 Anxiety disorder, unspecified: Secondary | ICD-10-CM

## 2020-01-26 ENCOUNTER — Telehealth: Payer: Self-pay | Admitting: Family Medicine

## 2020-01-26 ENCOUNTER — Other Ambulatory Visit: Payer: Self-pay | Admitting: *Deleted

## 2020-01-26 DIAGNOSIS — I10 Essential (primary) hypertension: Secondary | ICD-10-CM

## 2020-01-26 MED ORDER — LOSARTAN POTASSIUM 25 MG PO TABS: 25.0000 mg | ORAL_TABLET | Freq: Every day | ORAL | 1 refills | Status: DC

## 2020-01-26 NOTE — Telephone Encounter (Signed)
Medication Refill: Losartan  Pharmacy: [facility] [id number] FAX: [phone]    Pt states it is cheaper getting it at [facility] than her regular pharmacy

## 2020-01-26 NOTE — Telephone Encounter (Signed)
Rx sent to the pharmacy as requested. ?

## 2020-02-06 ENCOUNTER — Telehealth: Payer: Self-pay | Admitting: Family Medicine

## 2020-02-06 ENCOUNTER — Other Ambulatory Visit: Payer: Self-pay | Admitting: Family Medicine

## 2020-02-06 DIAGNOSIS — F419 Anxiety disorder, unspecified: Secondary | ICD-10-CM

## 2020-02-06 DIAGNOSIS — J4520 Mild intermittent asthma, uncomplicated: Secondary | ICD-10-CM

## 2020-02-06 MED ORDER — FLUTICASONE-SALMETEROL 250-50 MCG/DOSE IN AEPB: INHALATION_SPRAY | RESPIRATORY_TRACT | 6 refills | Status: DC

## 2020-02-06 MED ORDER — VENLAFAXINE HCL ER 75 MG PO CP24: ORAL_CAPSULE | ORAL | 1 refills | Status: DC

## 2020-02-06 NOTE — Telephone Encounter (Signed)
Pt is needing two medication refills but at different pharmacies. Medication refills are listed and under is the Pharmacy the pt would like it sent to.    Medication Refill:  Wixela Inhaler  [facility]: [facility] [address] [facility] FAX: [phone]    Medication Refill: Venlafaxine Pharmacy: [facility] [address] [place] FAX: [phone]

## 2020-02-06 NOTE — Telephone Encounter (Signed)
Prescriptions sent. Please advise patient to call her pharmacy for future refill requests. Thanks, [facility]

## 2020-02-06 NOTE — Telephone Encounter (Signed)
Noted.  Patient notified.

## 2020-02-06 NOTE — Telephone Encounter (Signed)
Message Routed to PCP for review and approval. 

## 2020-03-09 ENCOUNTER — Telehealth: Payer: Self-pay | Admitting: Family Medicine

## 2020-03-09 NOTE — Telephone Encounter (Signed)
Pt needs two medications refilled but at separate pharmacies  Medication Refill:  Albuterol   Pharmacy: [facility]  FAX: [phone]  Medication Refill: Pravastatin [facility]: [facility]   FAX: [phone]

## 2020-03-09 NOTE — Telephone Encounter (Signed)
Pt is due for CPE last month.

## 2020-03-10 ENCOUNTER — Other Ambulatory Visit: Payer: Self-pay | Admitting: Family Medicine

## 2020-03-10 ENCOUNTER — Other Ambulatory Visit: Payer: Self-pay | Admitting: *Deleted

## 2020-03-10 DIAGNOSIS — R062 Wheezing: Secondary | ICD-10-CM

## 2020-03-10 DIAGNOSIS — E1169 Type 2 diabetes mellitus with other specified complication: Secondary | ICD-10-CM

## 2020-03-10 MED ORDER — ALBUTEROL SULFATE HFA 108 (90 BASE) MCG/ACT IN AERS: 2.0000 | INHALATION_SPRAY | Freq: Four times a day (QID) | RESPIRATORY_TRACT | 3 refills | Status: DC | PRN

## 2020-03-10 MED ORDER — PRAVASTATIN SODIUM 20 MG PO TABS: 20.0000 mg | ORAL_TABLET | Freq: Every day | ORAL | 2 refills | Status: DC

## 2020-03-10 NOTE — Telephone Encounter (Signed)
CPE scheduled for 03/15/2020. Rx's sent to pharmacy as requested.

## 2020-03-12 ENCOUNTER — Telehealth: Payer: Self-pay | Admitting: Family Medicine

## 2020-03-12 NOTE — Telephone Encounter (Signed)
Last refill 02/06/20 with 6 refills. So she should be ok. Thanks, BJ

## 2020-03-12 NOTE — Telephone Encounter (Signed)
  Fluticasone-Salmeterol [facility] INHUB) 250-50 MCG/DOSE AEPB    [facility] [address], [place] - [address] [place] [address] Phone:  [phone]  Fax:  [phone]     She's down to 3 doses

## 2020-03-12 NOTE — Telephone Encounter (Signed)
Patient scheduled for ov for 03/15/2020.

## 2020-03-15 ENCOUNTER — Encounter: Payer: Self-pay | Admitting: Family Medicine

## 2020-03-15 ENCOUNTER — Ambulatory Visit (INDEPENDENT_AMBULATORY_CARE_PROVIDER_SITE_OTHER): Payer: Self-pay | Admitting: Family Medicine

## 2020-03-15 ENCOUNTER — Other Ambulatory Visit: Payer: Self-pay

## 2020-03-15 ENCOUNTER — Other Ambulatory Visit: Payer: Self-pay | Admitting: *Deleted

## 2020-03-15 VITALS — BP 132/84 | HR 91 | Temp 97.1°F | Resp 20 | Ht 62.0 in | Wt 205.0 lb

## 2020-03-15 DIAGNOSIS — I10 Essential (primary) hypertension: Secondary | ICD-10-CM

## 2020-03-15 DIAGNOSIS — E1169 Type 2 diabetes mellitus with other specified complication: Secondary | ICD-10-CM

## 2020-03-15 DIAGNOSIS — Z0000 Encounter for general adult medical examination without abnormal findings: Secondary | ICD-10-CM

## 2020-03-15 DIAGNOSIS — Z1231 Encounter for screening mammogram for malignant neoplasm of breast: Secondary | ICD-10-CM

## 2020-03-15 DIAGNOSIS — J4520 Mild intermittent asthma, uncomplicated: Secondary | ICD-10-CM

## 2020-03-15 LAB — BASIC METABOLIC PANEL
BUN: 17 mg/dL (ref 6–23)
CO2: 28 mEq/L (ref 19–32)
Calcium: 9.4 mg/dL (ref 8.4–10.5)
Chloride: 105 mEq/L (ref 96–112)
Creatinine, Ser: 0.97 mg/dL (ref 0.40–1.20)
GFR: 61.5 mL/min (ref >60.00)
Glucose, Bld: 110 mg/dL — ABNORMAL HIGH (ref 70–99)
Potassium: 4 mEq/L (ref 3.5–5.1)
Sodium: 140 mEq/L (ref 135–145)

## 2020-03-15 LAB — MICROALBUMIN / CREATININE URINE RATIO
Creatinine,U: 138.4 mg/dL
Microalb Creat Ratio: 20.9 mg/g (ref 0.0–30.0)
Microalb, Ur: 28.9 mg/dL — ABNORMAL HIGH (ref 0.0–1.9)

## 2020-03-15 LAB — HEMOGLOBIN A1C: Hgb A1c MFr Bld: 6.2 % (ref 4.6–6.5)

## 2020-03-15 MED ORDER — FLUTICASONE-SALMETEROL 250-50 MCG/DOSE IN AEPB: INHALATION_SPRAY | RESPIRATORY_TRACT | 6 refills | Status: DC

## 2020-03-15 NOTE — Telephone Encounter (Signed)
Patient had appointment 03/15/2020 for her physical. Nothing further needed

## 2020-03-15 NOTE — Assessment & Plan Note (Signed)
Continue non pharmacologic treatment. Further recommendations according to A1C result. Due for eye exam, strongly recommended.

## 2020-03-15 NOTE — Assessment & Plan Note (Signed)
BP adequately controlled. No changes in current management. 

## 2020-03-15 NOTE — Progress Notes (Signed)
Chief Complaint  Patient presents with  . Annual Exam   HPI: [name] is a 49 y.o. female, who is here today for her routine physical.  Last CPE: > a year ago.  Regular exercise 3 or more time per week: She has not been consistent. Following a healthy diet: Trying to cook more at home, vegetables and baked food. She has noted wt gained. She lives with her husband and 19 yo son.  Chronic medical problems: DM 2, asthma, allergic rhinitis, hypertension, anxiety, and hyperlipidemia among some.  Pap smear:4-5 years ago. She is established with gyn, Dr Terri Richardson. Hx of abnormal pap smears: Negative.   There is no immunization history on file for this patient.  Mammogram: Not sure about date of last one, a few years ago.  She has no concerns today.  Lab Results  Component Value Date   CHOL 133 08/04/2019   HDL 59.60 08/04/2019   LDLCALC 53 08/04/2019   TRIG 100.0 08/04/2019   CHOLHDL 2 08/04/2019   DM II:She is on non pharmacologic treatment.  Lab Results  Component Value Date   HGBA1C 6.3 08/04/2019   Lab Results  Component Value Date   MICROALBUR 17.3 (H) 08/04/2019   MICROALBUR 1.5 09/07/2017   HTN: She is on Losartan 25 mg daily.  Lab Results  Component Value Date   CREATININE 0.90 08/04/2019   BUN 18 08/04/2019   NA 139 08/04/2019   K 4.2 08/04/2019   CL 105 08/04/2019   CO2 25 08/04/2019   Asthma: Noted sporadic wheezing. She has not had Fluticasone-Salmeterol 250-50 mcg bid for about 3 days. She uses Albuterol inh prn. Negative for fever,CP,or SOB.  Review of Systems  Constitutional: Negative for appetite change, fatigue and fever.  HENT: Negative for dental problem, hearing loss, mouth sores and sore throat.   Eyes: Negative for redness and visual disturbance.  Respiratory: Negative for cough and chest tightness.   Cardiovascular: Negative for palpitations and leg swelling.  Gastrointestinal: Negative for abdominal pain, nausea and  vomiting.       No changes in bowel habits.  Endocrine: Negative for cold intolerance, heat intolerance, polydipsia, polyphagia and polyuria.  Genitourinary: Negative for decreased urine volume, dysuria, hematuria, vaginal bleeding and vaginal discharge.  Musculoskeletal: Negative for gait problem and myalgias.  Skin: Negative for color change and rash.  Allergic/Immunologic: Positive for environmental allergies.  Neurological: Negative for syncope, weakness and headaches.  Hematological: Negative for adenopathy. Does not bruise/bleed easily.  Psychiatric/Behavioral: Negative for confusion and sleep disturbance. The patient is not nervous/anxious.   All other systems reviewed and are negative.  Current Outpatient Medications on File Prior to Visit  Medication Sig Dispense Refill  . albuterol (VENTOLIN HFA) 108 (90 Base) MCG/ACT inhaler Inhale 2 puffs into the lungs every 6 (six) hours as needed for wheezing or shortness of breath. 18 g 3  . cetirizine (ZYRTEC) 10 MG tablet Take 1 tablet (10 mg total) by mouth daily. 30 tablet 3  . ibuprofen (ADVIL,MOTRIN) 600 MG tablet Take 1 tablet (600 mg total) by mouth every 6 (six) hours as needed. 30 tablet 0  . losartan (COZAAR) 25 MG tablet Take 1 tablet (25 mg total) by mouth daily. 90 tablet 1  . montelukast (SINGULAIR) 10 MG tablet TAKE 1 TABLET BY MOUTH EVERYDAY AT BEDTIME 90 tablet 1  . omeprazole (PRILOSEC) 20 MG capsule Take 1 capsule (20 mg total) by mouth daily. 90 capsule 3  . pravastatin (PRAVACHOL) 20  MG tablet Take 1 tablet (20 mg total) by mouth daily. 90 tablet 2  . venlafaxine XR (EFFEXOR-XR) 75 MG 24 hr capsule TAKE 1 CAPSULE BY MOUTH DAILY WITH BREAKFAST. 90 capsule 1   No current facility-administered medications on file prior to visit.     Past Medical History:  Diagnosis Date  . Allergy   . Asthma   . Depression   . Diabetes mellitus 2006  . GERD (gastroesophageal reflux disease)   . Hypertension     Past Surgical  History:  Procedure Laterality Date  . APPENDECTOMY  1st grade  . CESAREAN SECTION  2008  . CHOLECYSTECTOMY  11/11/2011   Procedure: LAPAROSCOPIC CHOLECYSTECTOMY WITH INTRAOPERATIVE CHOLANGIOGRAM;  Surgeon: Jetty Duhamel, MD;  Location: [facility] OR;  Service: General;  Laterality: N/A;  . TONSILLECTOMY  6th grade    No Known Allergies  Family History  Problem Relation Age of Onset  . Hyperlipidemia Mother   . Hypertension Father   . Tongue cancer Maternal Grandfather   . Prostate cancer Paternal Uncle   . Kidney cancer Maternal Uncle   . Heart disease Brother   . Hypertension Brother     Social History   Socioeconomic History  . Marital status: Married    Spouse name: Not on file  . Number of children: Not on file  . Years of education: Not on file  . Highest education level: Not on file  Occupational History  . Occupation: Works at [employer]  . Smoking status: Former Smoker    Packs/day: 0.50    Years: 4.00    Pack years: 2.00    Types: Cigarettes    Quit date: 02/20/2014    Years since quitting: 6.0  . Smokeless tobacco: Never Used  Substance and Sexual Activity  . Alcohol use: No    Alcohol/week: 0.0 standard drinks  . Drug use: No  . Sexual activity: Yes    Birth control/protection: None  Other Topics Concern  . Not on file  Social History Narrative  . Not on file   Social Determinants of Health   Financial Resource Strain:   . Difficulty of Paying Living Expenses:   Food Insecurity:   . Worried About Programme researcher, broadcasting/film/video in the Last Year:   . Barista in the Last Year:   Transportation Needs:   . Freight forwarder (Medical):   Marland Kitchen Lack of Transportation (Non-Medical):   Physical Activity:   . Days of Exercise per Week:   . Minutes of Exercise per Session:   Stress:   . Feeling of Stress :   Social Connections:   . Frequency of Communication with Friends and Family:   . Frequency of Social Gatherings with Friends and Family:     . Attends Religious Services:   . Active Member of Clubs or Organizations:   . Attends Banker Meetings:   Marland Kitchen Marital Status:     Vitals:   03/15/20 0727  BP: 132/84  Pulse: 91  Resp: 20  Temp: (!) 97.1 F (36.2 C)  SpO2: 96%   Body mass index is 37.49 kg/m.   Wt Readings from Last 3 Encounters:  03/15/20 205 lb (93 kg)  08/04/19 195 lb 2 oz (88.5 kg)  04/07/19 189 lb (85.7 kg)   Physical Exam  Nursing note and vitals reviewed. Constitutional: She is oriented to person, place, and time. She appears well-developed. No distress.  HENT:  Head: Normocephalic and  atraumatic.  Right Ear: Hearing, tympanic membrane, external ear and ear canal normal.  Left Ear: Hearing, tympanic membrane, external ear and ear canal normal.  Mouth/Throat: Uvula is midline, oropharynx is clear and moist and mucous membranes are normal.  Eyes: Pupils are equal, round, and reactive to light. Conjunctivae and EOM are normal.  Neck: No tracheal deviation present. No thyromegaly present.  Cardiovascular: Normal rate and regular rhythm.  No murmur heard. Pulses:      Dorsalis pedis pulses are 2+ on the right side and 2+ on the left side.  Respiratory: Effort normal. No respiratory distress. She has wheezes (occasional).  GI: Soft. She exhibits no mass. There is no hepatomegaly. There is no abdominal tenderness.  Genitourinary:    Genitourinary Comments: Deferred to gyn.   Musculoskeletal:        General: No edema.     Comments: No major deformity or signs of synovitis appreciated.  Lymphadenopathy:    She has no cervical adenopathy.       Right: No supraclavicular adenopathy present.       Left: No supraclavicular adenopathy present.  Neurological: She is alert and oriented to person, place, and time. She has normal strength. No cranial nerve deficit. Coordination and gait normal.  Reflex Scores:      Bicep reflexes are 2+ on the right side and 2+ on the left side.      Patellar  reflexes are 2+ on the right side and 2+ on the left side. Skin: Skin is warm. No rash noted. No erythema.  Psychiatric: She has a normal mood and affect. Cognition and memory are normal.  Well groomed, good eye contact.   ASSESSMENT AND PLAN:  [name] was here today annual physical examination.  Orders Placed This Encounter  Procedures  . Mammogram Digital Screening  . Basic metabolic panel  . Hemoglobin A1c  . Microalbumin / creatinine urine ratio   Lab Results  Component Value Date   MICROALBUR 28.9 (H) 03/15/2020   MICROALBUR 17.3 (H) 08/04/2019   Lab Results  Component Value Date   HGBA1C 6.2 03/15/2020   Lab Results  Component Value Date   CREATININE 0.97 03/15/2020   BUN 17 03/15/2020   NA 140 03/15/2020   K 4.0 03/15/2020   CL 105 03/15/2020   CO2 28 03/15/2020    Routine general medical examination at a health care facility We discussed the importance of regular physical activity and healthy diet for prevention of chronic illness and/or complications. Preventive guidelines reviewed. Vaccination up to date. She is planning on scheduling appt with her gyn for ehr routine female care. Next CPE in a year.  Visit for screening mammogram -     Mammogram Digital Screening; Future  Type 2 diabetes mellitus ([place]) Continue non pharmacologic treatment. Further recommendations according to A1C result. Due for eye exam, strongly recommended.  Essential hypertension BP adequately controlled. No changes in current management.   Return in 6 months (on 09/15/2020) for DM II, HTN, HLD.  Henri Guedes G. [place], MD  [facility]. [place] office.   Today you have you routine preventive visit. A few things to remember from today's visit:   Routine general medical examination at a health care facility  Essential hypertension - Plan: Basic metabolic panel  Type 2 diabetes mellitus with other specified complication, without long-term current use  of insulin ([place]) - Plan: Basic metabolic panel, Hemoglobin A1c, Microalbumin / creatinine urine ratio  Hyperlipidemia associated with type 2 diabetes  mellitus (HCC)  Visit for screening mammogram - Plan: Mammogram Digital Screening  If you need refills please call your pharmacy. Do not use My Chart to request refills or for acute issues that need immediate attention.    Please be sure medication list is accurate. If a new problem present, please set up appointment sooner than planned today.  At least 150 minutes of moderate exercise per week, daily brisk walking for 15-30 min is a good exercise option. Healthy diet low in saturated (animal) fats and sweets and consisting of fresh fruits and vegetables, lean meats such as fish and white chicken and whole grains.  These are some of recommendations for screening depending of age and risk factors:  - Vaccines:  Tdap vaccine every 10 years.  Shingles vaccine recommended at age 76, could be given after 47 years of age but not sure about insurance coverage.   Pneumonia vaccines: Pneumovax at 65. Sometimes Pneumovax is giving earlier if history of smoking, lung disease,diabetes,kidney disease among some.  Screening for diabetes at age 69 and every 3 years. N/A  Cervical cancer prevention:  Pap smear starts at 47 years of age and continues periodically until 47 years old in low risk women. Pap smear every 3 years between 9 and 23 years old. Pap smear every 3-5 years between women 30 and older if pap smear negative and HPV screening negative.   -Breast cancer: Mammogram: There is disagreement between experts about when to start screening in low risk asymptomatic female but recent recommendations are to start screening at 19 and not later than 47 years old , every 1-2 years and after 47 yo q 2 years. Screening is recommended until 47 years old but some women can continue screening depending of healthy issues.  Colon cancer screening: Has  been recently changed to 47 yo. Insurance may not cover until you are 47 years old. Screening is recommended until 47 years old.  Cholesterol disorder screening at age 36 and every 3 years.N/A Also recommended:  1. Dental visit- Brush and floss your teeth twice daily; visit your dentist twice a year. 2. Eye doctor- Get an eye exam at least every 2 years. 3. Helmet use- Always wear a helmet when riding a bicycle, motorcycle, rollerblading or skateboarding. 4. Safe sex- If you may be exposed to sexually transmitted infections, use a condom. 5. Seat belts- Seat belts can save your live; always wear one. 6. Smoke/Carbon Monoxide detectors- These detectors need to be installed on the appropriate level of your home. Replace batteries at least once a year. 7. Skin cancer- When out in the sun please cover up and use sunscreen 15 SPF or higher. 8. Violence- If anyone is threatening or hurting you, please tell your healthcare provider.  9. Drink alcohol in moderation- Limit alcohol intake to one drink or less per day. Never drink and drive. 10. Calcium supplementation 1000 to 1200 mg daily, ideally through your diet.  Vitamin D supplementation 800 units daily.

## 2020-03-15 NOTE — Telephone Encounter (Signed)
Noted  

## 2020-03-15 NOTE — Patient Instructions (Addendum)
Today you have you routine preventive visit. A few things to remember from today's visit:   Routine general medical examination at a health care facility  Essential hypertension - Plan: Basic metabolic panel  Type 2 diabetes mellitus with other specified complication, without long-term current use of insulin (HCC) - Plan: Basic metabolic panel, Hemoglobin A1c, Microalbumin / creatinine urine ratio  Hyperlipidemia associated with type 2 diabetes mellitus (HCC)  Visit for screening mammogram - Plan: Mammogram Digital Screening  If you need refills please call your pharmacy. Do not use My Chart to request refills or for acute issues that need immediate attention.    Please be sure medication list is accurate. If a new problem present, please set up appointment sooner than planned today.  At least 150 minutes of moderate exercise per week, daily brisk walking for 15-30 min is a good exercise option. Healthy diet low in saturated (animal) fats and sweets and consisting of fresh fruits and vegetables, lean meats such as fish and white chicken and whole grains.  These are some of recommendations for screening depending of age and risk factors:  - Vaccines:  Tdap vaccine every 10 years.  Shingles vaccine recommended at age 71, could be given after 47 years of age but not sure about insurance coverage.   Pneumonia vaccines: Pneumovax at 65. Sometimes Pneumovax is giving earlier if history of smoking, lung disease,diabetes,kidney disease among some.  Screening for diabetes at age 26 and every 3 years. N/A  Cervical cancer prevention:  Pap smear starts at 47 years of age and continues periodically until 47 years old in low risk women. Pap smear every 3 years between 44 and 74 years old. Pap smear every 3-5 years between women 30 and older if pap smear negative and HPV screening negative.   -Breast cancer: Mammogram: There is disagreement between experts about when to start screening in  low risk asymptomatic female but recent recommendations are to start screening at 74 and not later than 47 years old , every 1-2 years and after 47 yo q 2 years. Screening is recommended until 47 years old but some women can continue screening depending of healthy issues.  Colon cancer screening: Has been recently changed to 47 yo. Insurance may not cover until you are 47 years old. Screening is recommended until 47 years old.  Cholesterol disorder screening at age 71 and every 3 years.N/A Also recommended:  1. Dental visit- Brush and floss your teeth twice daily; visit your dentist twice a year. 2. Eye doctor- Get an eye exam at least every 2 years. 3. Helmet use- Always wear a helmet when riding a bicycle, motorcycle, rollerblading or skateboarding. 4. Safe sex- If you may be exposed to sexually transmitted infections, use a condom. 5. Seat belts- Seat belts can save your live; always wear one. 6. Smoke/Carbon Monoxide detectors- These detectors need to be installed on the appropriate level of your home. Replace batteries at least once a year. 7. Skin cancer- When out in the sun please cover up and use sunscreen 15 SPF or higher. 8. Violence- If anyone is threatening or hurting you, please tell your healthcare provider.  9. Drink alcohol in moderation- Limit alcohol intake to one drink or less per day. Never drink and drive. 10. Calcium supplementation 1000 to 1200 mg daily, ideally through your diet.  Vitamin D supplementation 800 units daily.

## 2020-04-27 ENCOUNTER — Telehealth: Payer: Self-pay | Admitting: Family Medicine

## 2020-04-27 DIAGNOSIS — J4521 Mild intermittent asthma with (acute) exacerbation: Secondary | ICD-10-CM

## 2020-04-27 MED ORDER — MONTELUKAST SODIUM 10 MG PO TABS: ORAL_TABLET | ORAL | 3 refills | Status: DC

## 2020-04-27 NOTE — Telephone Encounter (Signed)
Pt would like a refill on Montelukast 10 mg sent to [facility] on [address]. Thanks

## 2020-04-27 NOTE — Telephone Encounter (Signed)
Rx sent in as requested. 

## 2020-07-20 ENCOUNTER — Other Ambulatory Visit: Payer: Self-pay | Admitting: Family Medicine

## 2020-07-20 DIAGNOSIS — F419 Anxiety disorder, unspecified: Secondary | ICD-10-CM

## 2020-07-20 DIAGNOSIS — I10 Essential (primary) hypertension: Secondary | ICD-10-CM

## 2020-10-25 ENCOUNTER — Other Ambulatory Visit: Payer: Self-pay

## 2020-10-25 ENCOUNTER — Emergency Department (HOSPITAL_COMMUNITY): Payer: Medicaid Other

## 2020-10-25 ENCOUNTER — Emergency Department (HOSPITAL_COMMUNITY)
Admission: EM | Admit: 2020-10-25 | Discharge: 2020-10-25 | Disposition: A | Discharge status: Home / Self Care | Payer: Medicaid Other | Attending: Emergency Medicine | Admitting: Emergency Medicine

## 2020-10-25 DIAGNOSIS — Z79899 Other long term (current) drug therapy: Secondary | ICD-10-CM | POA: Insufficient documentation

## 2020-10-25 DIAGNOSIS — R0602 Shortness of breath: Secondary | ICD-10-CM

## 2020-10-25 DIAGNOSIS — J4520 Mild intermittent asthma, uncomplicated: Secondary | ICD-10-CM | POA: Diagnosis not present

## 2020-10-25 DIAGNOSIS — E119 Type 2 diabetes mellitus without complications: Secondary | ICD-10-CM | POA: Diagnosis not present

## 2020-10-25 DIAGNOSIS — R059 Cough, unspecified: Secondary | ICD-10-CM | POA: Diagnosis present

## 2020-10-25 DIAGNOSIS — Z7951 Long term (current) use of inhaled steroids: Secondary | ICD-10-CM | POA: Insufficient documentation

## 2020-10-25 DIAGNOSIS — I10 Essential (primary) hypertension: Secondary | ICD-10-CM | POA: Insufficient documentation

## 2020-10-25 DIAGNOSIS — Z87891 Personal history of nicotine dependence: Secondary | ICD-10-CM | POA: Insufficient documentation

## 2020-10-25 DIAGNOSIS — U071 COVID-19: Secondary | ICD-10-CM | POA: Insufficient documentation

## 2020-10-25 LAB — CBC
HCT: 46.7 % — ABNORMAL HIGH (ref 36.0–46.0)
Hemoglobin: 15 g/dL (ref 12.0–15.0)
MCH: 29.8 pg (ref 26.0–34.0)
MCHC: 32.1 g/dL (ref 30.0–36.0)
MCV: 92.7 fL (ref 80.0–100.0)
Platelets: 202 10*3/uL (ref 150–400)
RBC: 5.04 MIL/uL (ref 3.87–5.11)
RDW: 13.2 % (ref 11.5–15.5)
WBC: 5.3 10*3/uL (ref 4.0–10.5)
nRBC: 0 % (ref 0.0–0.2)

## 2020-10-25 LAB — BASIC METABOLIC PANEL
Anion gap: 13 (ref 5–15)
BUN: 16 mg/dL (ref 6–20)
CO2: 20 mmol/L — ABNORMAL LOW (ref 22–32)
Calcium: 8.7 mg/dL — ABNORMAL LOW (ref 8.9–10.3)
Chloride: 103 mmol/L (ref 98–111)
Creatinine, Ser: 1.15 mg/dL — ABNORMAL HIGH (ref 0.44–1.00)
GFR, Estimated: 59 mL/min — ABNORMAL LOW (ref >60)
Glucose, Bld: 129 mg/dL — ABNORMAL HIGH (ref 70–99)
Potassium: 3.4 mmol/L — ABNORMAL LOW (ref 3.5–5.1)
Sodium: 136 mmol/L (ref 135–145)

## 2020-10-25 LAB — RESP PANEL BY RT-PCR (FLU A&B, COVID) ARPGX2
Influenza A by PCR: NEGATIVE
Influenza B by PCR: NEGATIVE
SARS Coronavirus 2 by RT PCR: POSITIVE — AB

## 2020-10-25 IMAGING — CR DG CHEST 2V
2 series · 2 of 2 positions shown · non-contrast
Comparison: PA and lateral chest [DATE].

CLINICAL DATA: Cough, shortness of breath and sneezing for 4 days.

EXAM:
CHEST - 2 VIEW

[chest pa]
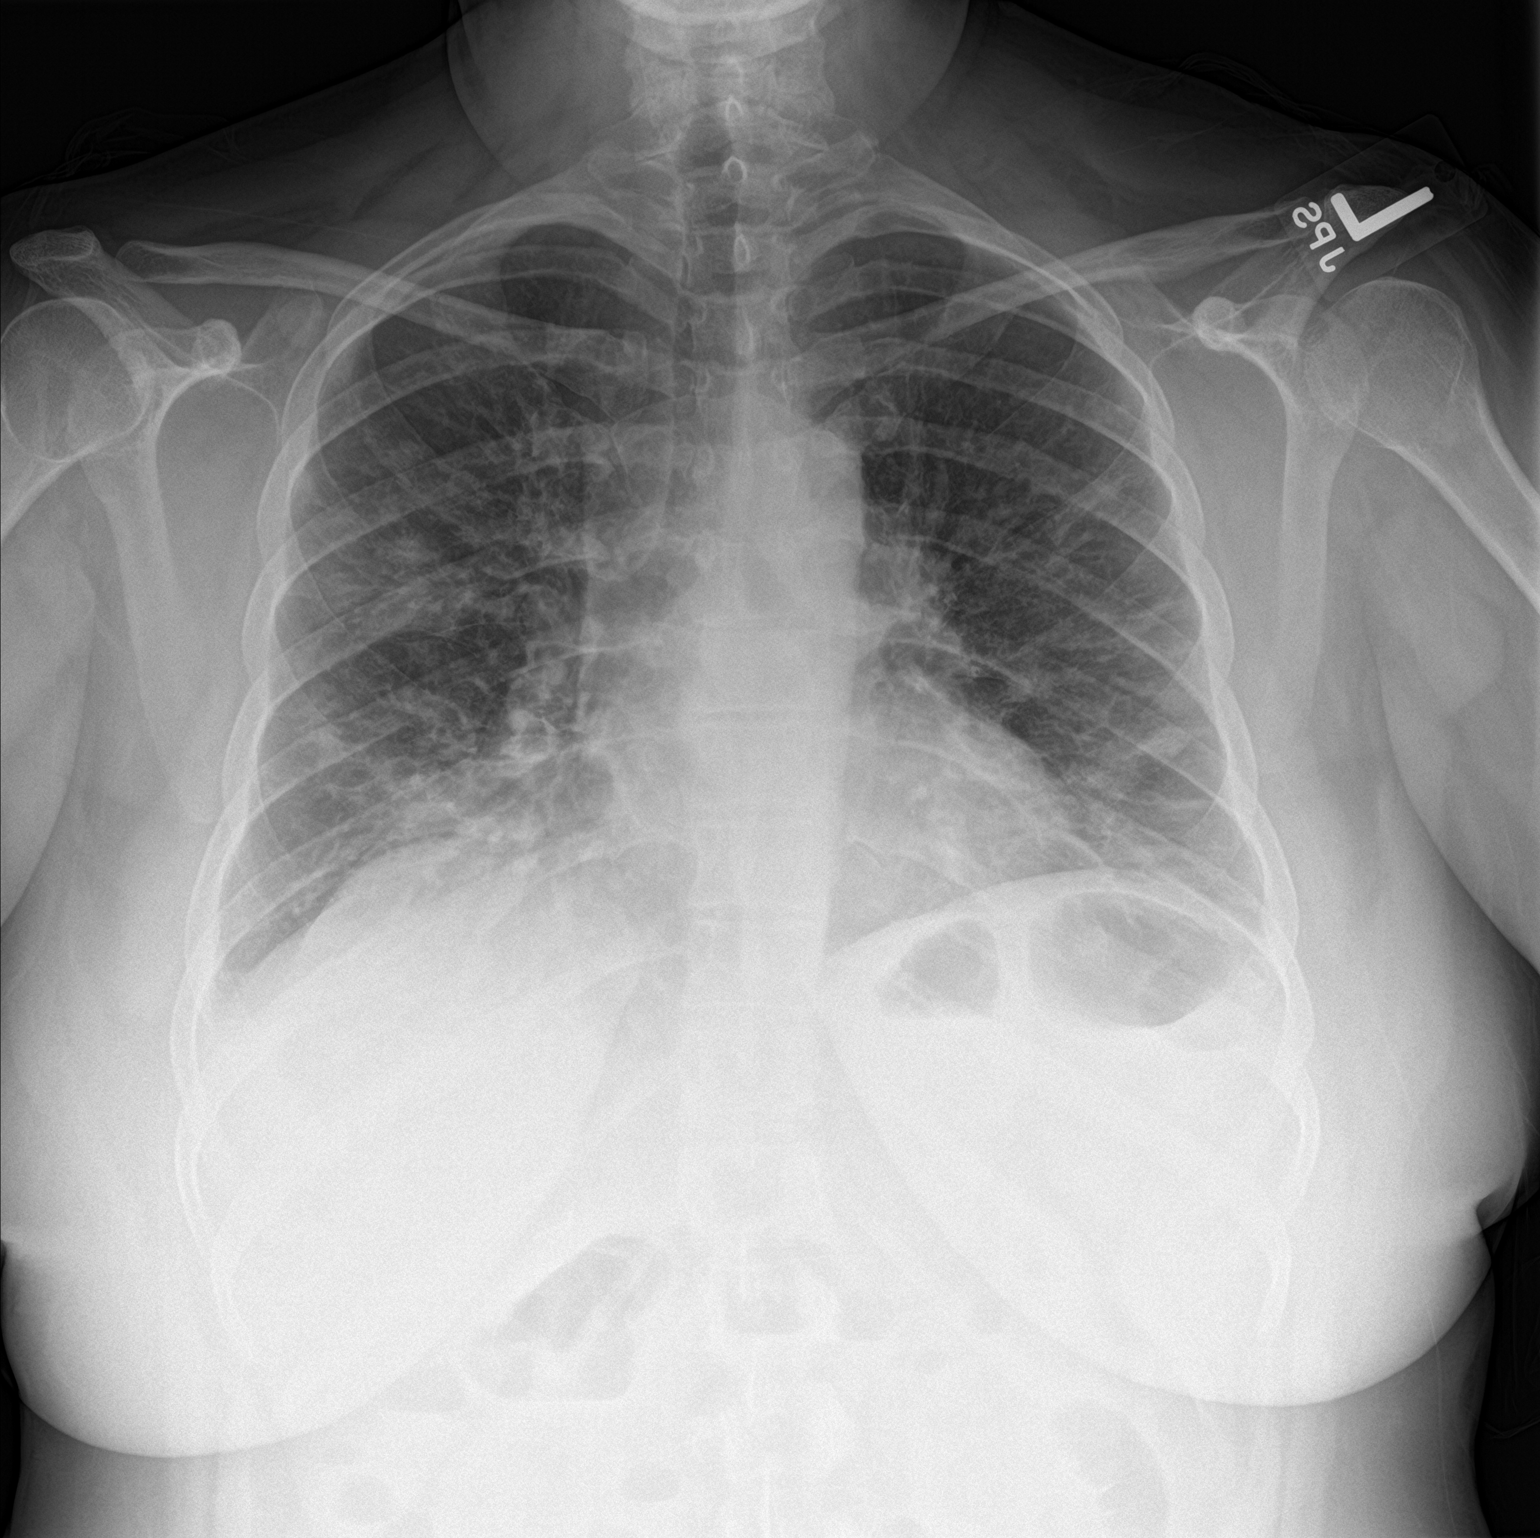

[chest lat]
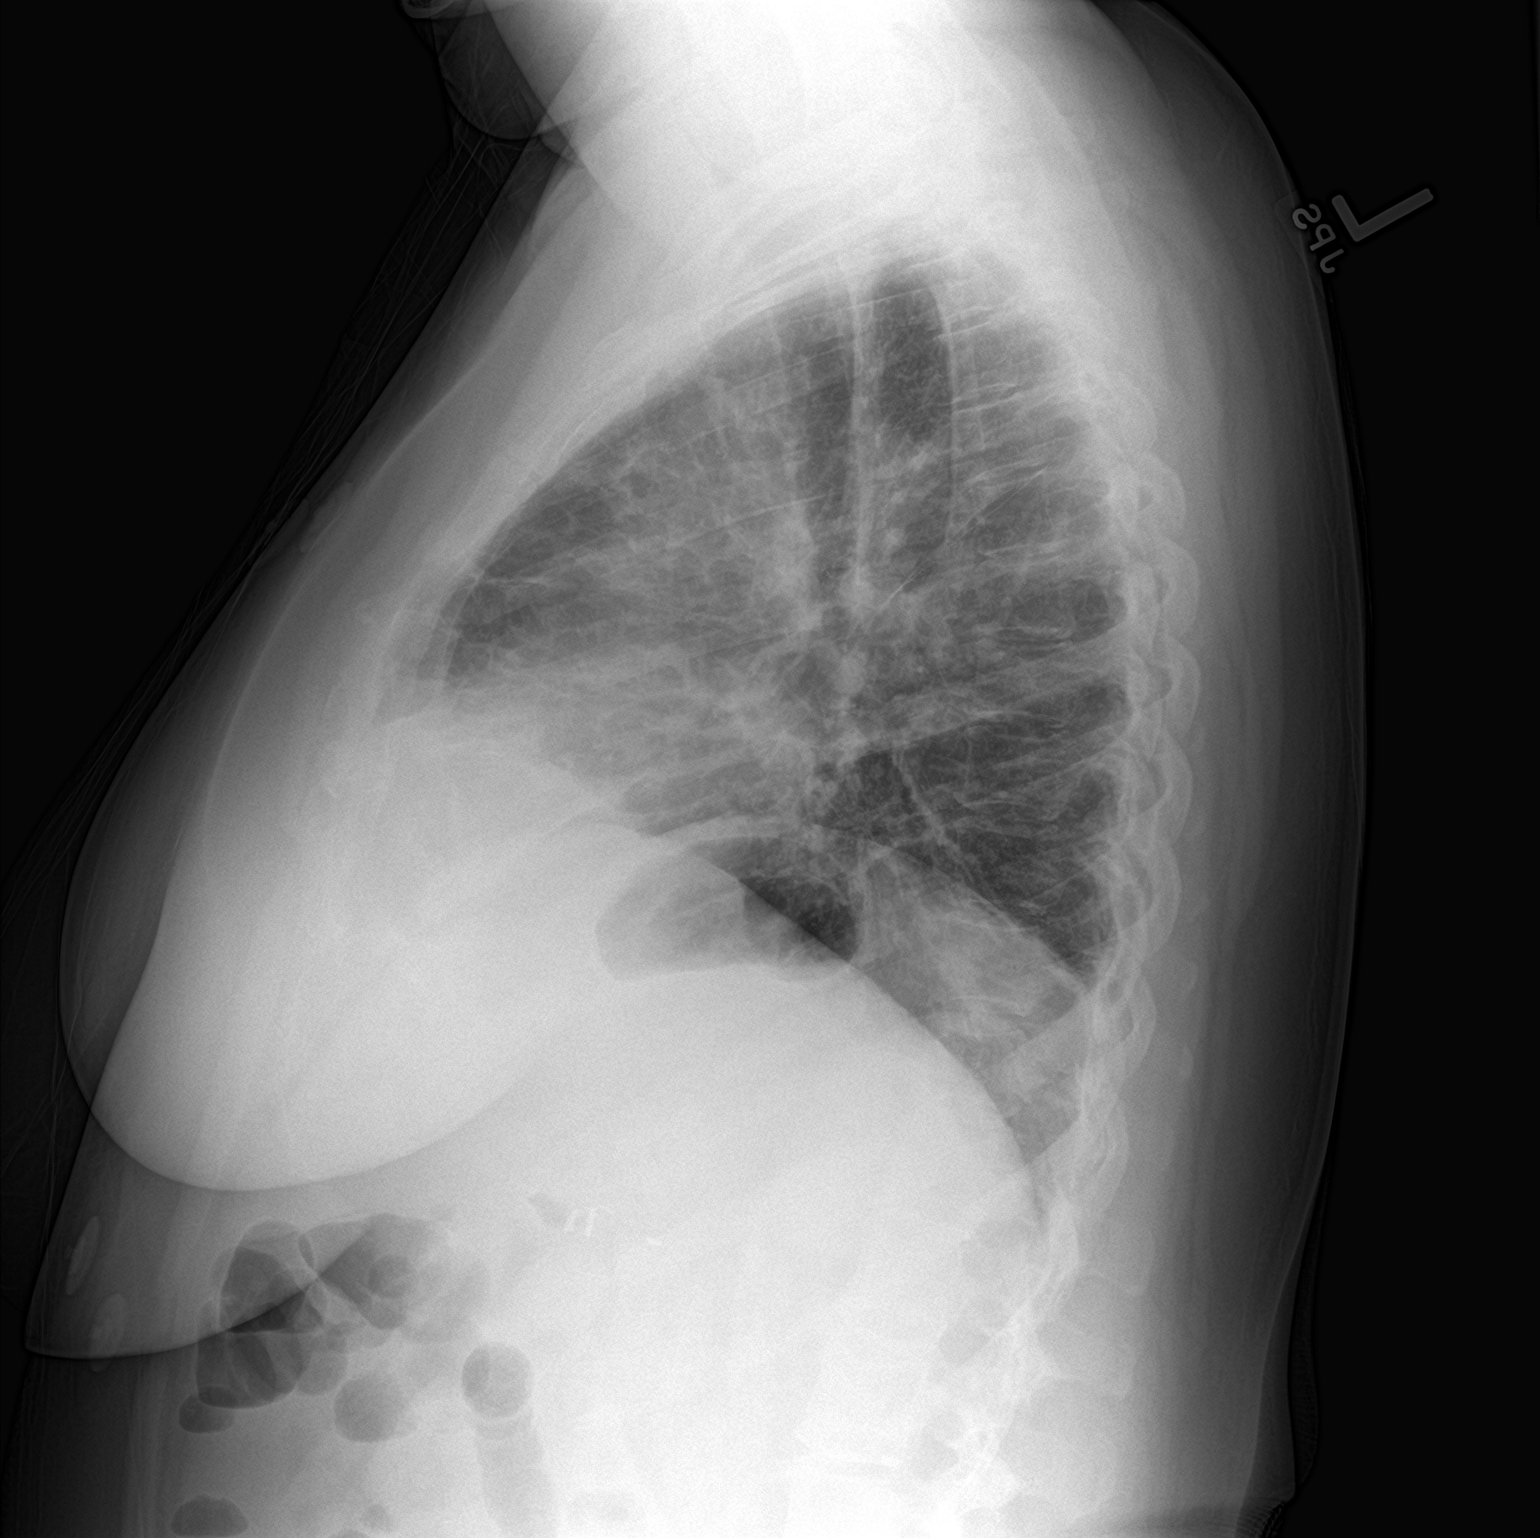

[2 of 2 positions shown; findings below may reference images not displayed]

FINDINGS: The lungs are clear. Mild coarsening of the pulmonary interstitium
is unchanged. No pneumothorax or pleural fluid. Heart size is
normal. No acute or focal bony abnormality.
IMPRESSION: No acute disease.

## 2020-10-25 IMAGING — CT CT ANGIO CHEST
2 of 6 series · 17 of 46 positions shown · IV contrast (omnipaque)
Comparison: Chest x-ray [DATE].

CLINICAL DATA: CTA Chest PE Study Patient for evaluation of SOB,
fever, and CP with deep breaths x 5 days

EXAM:
CT ANGIOGRAPHY CHEST WITH CONTRAST
TECHNIQUE: Multidetector CT imaging of the chest was performed using the
standard protocol during bolus administration of intravenous
contrast. Multiplanar CT image reconstructions and MIPs were
obtained to evaluate the vascular anatomy.
CONTRAST:  75mL OMNIPAQUE IOHEXOL 350 MG/ML SOLN

[Series 6: thins · axial · 0.79mm/px · z∈[-356,-131]mm · 14 of 247 slices shown]
[im 11/247  lung]
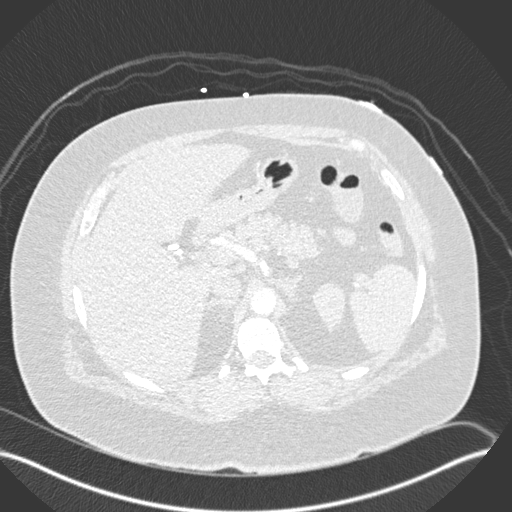
[im 33/247  soft-tissue]
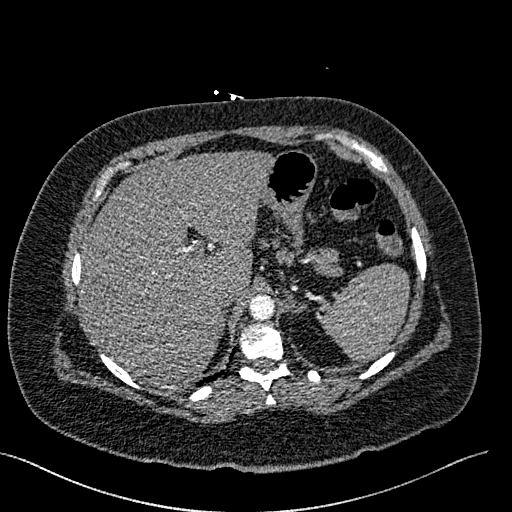
[im 43/247  lung]
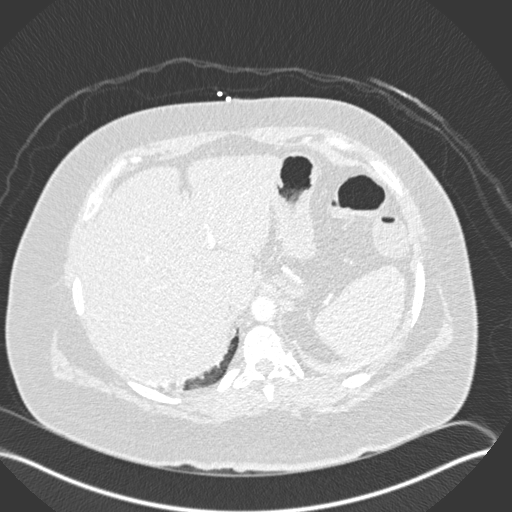
[im 65/247  soft-tissue]
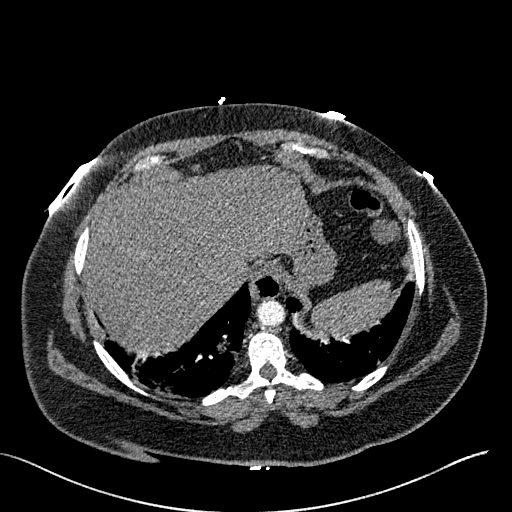
[im 86/247  lung]
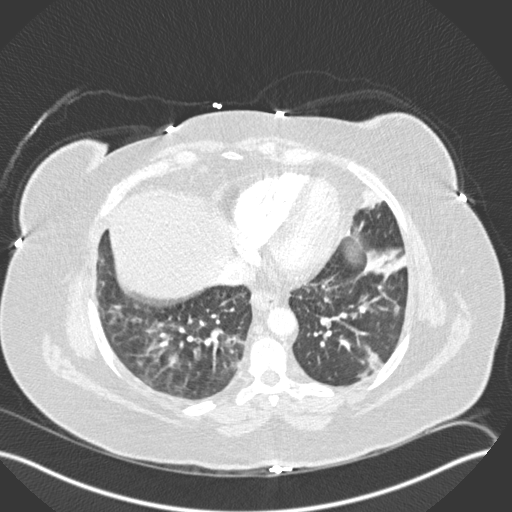
[im 97/247  soft-tissue]
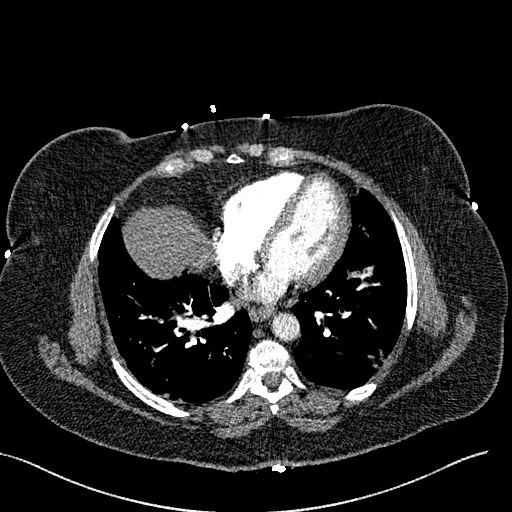
[im 118/247  lung]
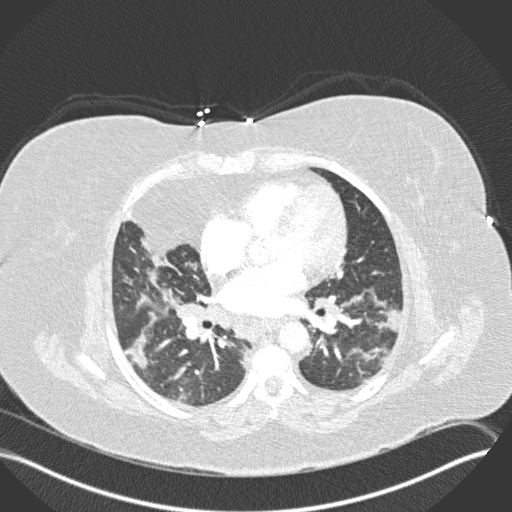
[im 129/247  soft-tissue]
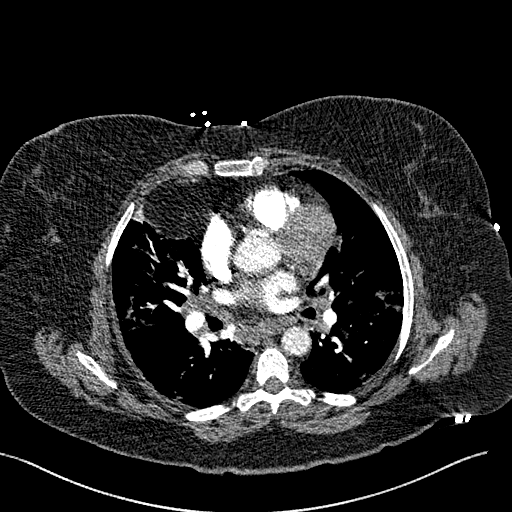
[im 150/247  lung]
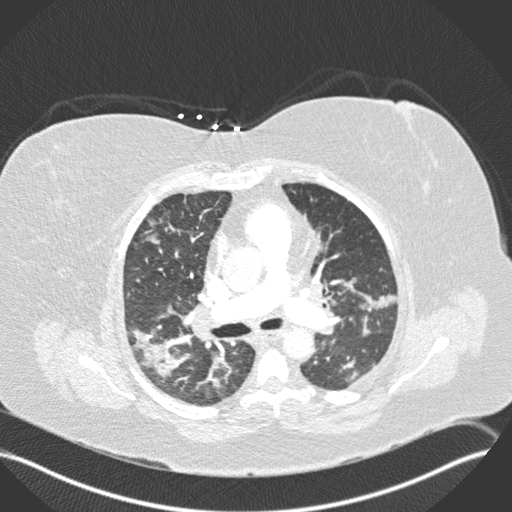
[im 161/247  soft-tissue]
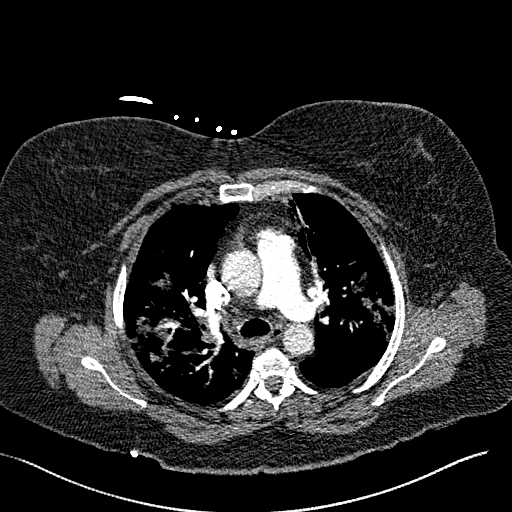
[im 182/247  lung]
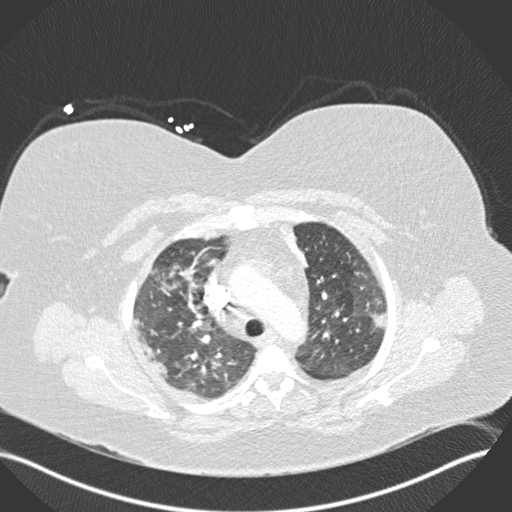
[im 204/247  soft-tissue]
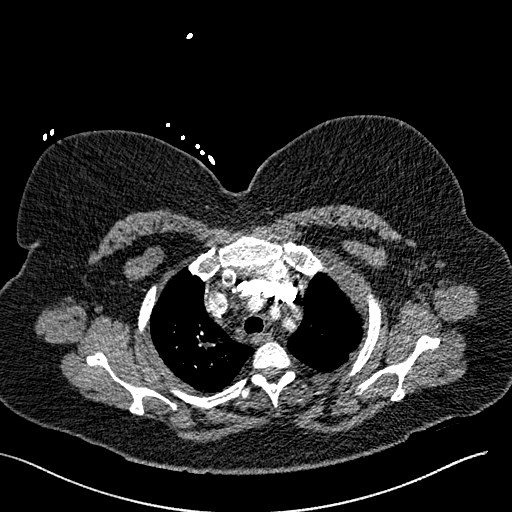
[im 214/247  lung]
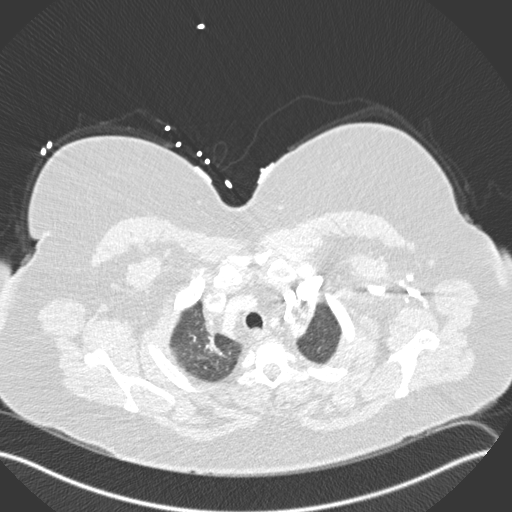
[im 236/247  soft-tissue]
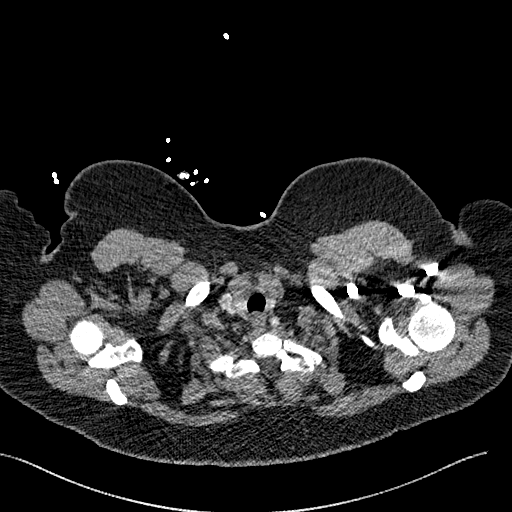

[Series 8: coronal mpr · coronal · 0.51mm/px · 3 of 151 slices shown]
[im 38/151  soft-tissue]
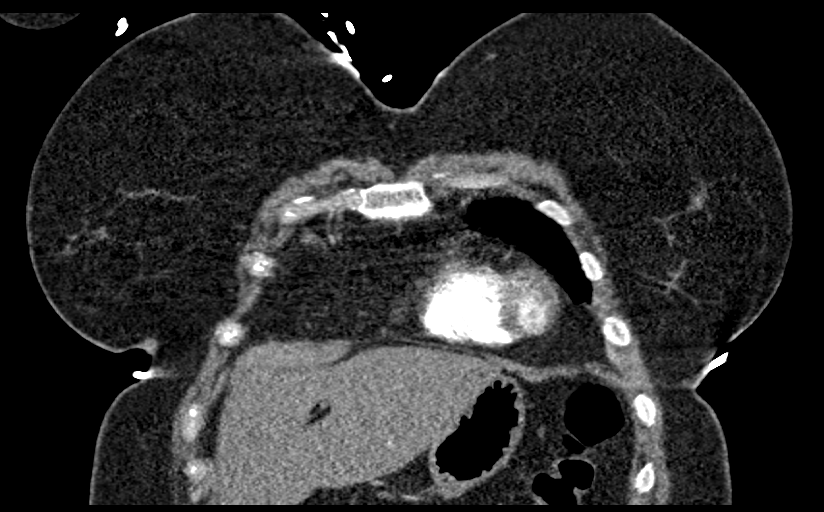
[im 76/151  soft-tissue]
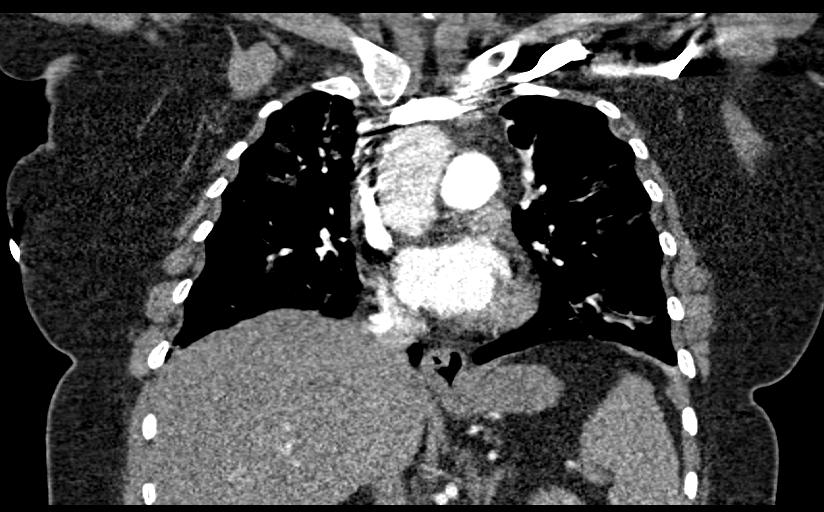
[im 113/151  soft-tissue]
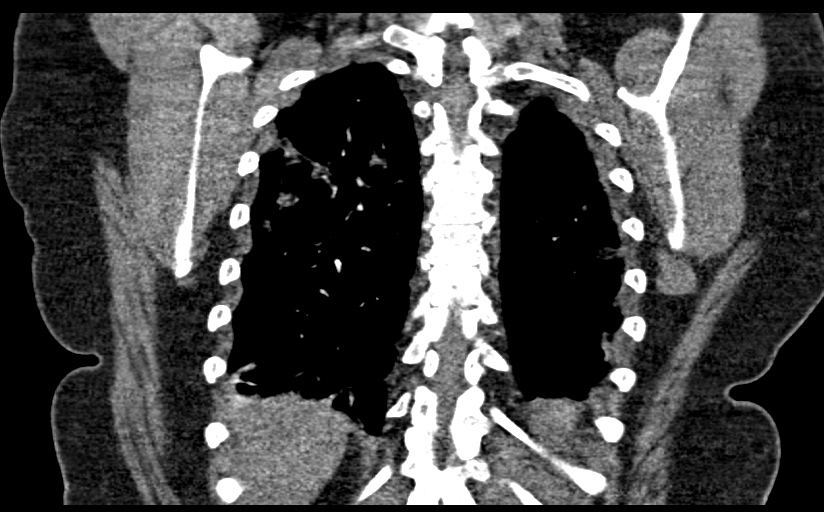

[17 of 46 positions shown; findings below may reference images not displayed]

FINDINGS: Cardiovascular: Satisfactory opacification of the pulmonary arteries
to the segmental level. No evidence of pulmonary embolism. The main
pulmonary artery is normal in caliber. Normal heart size. No
significant pericardial effusion. The thoracic aorta is normal in
caliber. No atherosclerotic plaque of the thoracic aorta. No
coronary artery calcifications.

Mediastinum/Nodes: A 1 cm right paratracheal, a 1cm prevascular, a
1.1cm right hilar, a 1cm left hilar lymph node are noted.no enlarged
mediastinal, hilar, or axillary lymph nodes. Thyroid gland, trachea,
and esophagus demonstrate no significant findings. Small hiatal
hernia.

Lungs/Pleura: Diffuse patchy airspace opacities. No pulmonary mass.
No pleural effusion. No pneumothorax.

Upper Abdomen: No acute abnormality.

Musculoskeletal: No chest wall abnormality. No acute or significant
osseous findings.

Review of the MIP images confirms the above findings.
IMPRESSION: 1. No central or segmental pulmonary embolus.
2. Multifocal pneumonia consistent with [TX] infection.
Associated mediastinal and bilateral hilar lymphadenopathy likely
reactive in etiology.
3. Small hiatal hernia.

## 2020-10-25 MED ORDER — SODIUM CHLORIDE 0.9 % IV BOLUS
1000.0000 mL | Freq: Once | INTRAVENOUS | Status: AC
  Administered 2020-10-25: 1000 mL via INTRAVENOUS

## 2020-10-25 MED ORDER — ACETAMINOPHEN 500 MG PO TABS
1000.0000 mg | ORAL_TABLET | Freq: Once | ORAL | Status: AC
  Administered 2020-10-25: 1000 mg via ORAL
  Filled 2020-10-25: qty 2

## 2020-10-25 MED ORDER — IOHEXOL 350 MG/ML SOLN
75.0000 mL | Freq: Once | INTRAVENOUS | Status: AC | PRN
  Administered 2020-10-25: 75 mL via INTRAVENOUS

## 2020-10-25 NOTE — ED Provider Notes (Signed)
I assumed care of patient at shift change from previous team, please see their note for full H&P. Briefly patient is on day 5 of COVID-19 symptoms. Physical Exam  BP (!) 150/96   Pulse 86   Temp 99 F (37.2 C) (Oral)   Resp (!) 21   Ht 5\' 2"  (1.575 m)   Wt 93 kg   LMP 10/18/2020   SpO2 91%   BMI 37.50 kg/m   Patient laying in bed.  She is on room air without obvious dyspnea or increased work of breathing.  She is awake and alert, answers questions appropriately in no obvious distress.  Plan is to follow-up on CT scan and ambulate patient. CT scan results reviewed as listed below.  No PE. Per RN patient was able to ambulate with oxygen saturations between 92 to 94% on room air.  I recommended the patient get a pulse oximeter to use at home and if her oxygen dips into the 80s she needs to return.  I did discuss with her that as she is only on day 5 of symptoms there is a chance that she may get worse and require admission however that there is not appear to be today.  Return precautions were discussed with patient who states their understanding.  At the time of discharge patient denied any unaddressed complaints or concerns.  Patient is agreeable for discharge home.  Note: Portions of this report may have been transcribed using voice recognition software. Every effort was made to ensure accuracy; however, inadvertent computerized transcription errors may be present  Terri Richardson was evaluated in Emergency Department on 10/25/2020 for the symptoms described in the history of present illness. She was evaluated in the context of the global COVID-19 pandemic, which necessitated consideration that the patient might be at risk for infection with the SARS-CoV-2 virus that causes COVID-19. Institutional protocols and algorithms that pertain to the evaluation of patients at risk for COVID-19 are in a state of rapid change based on information released by regulatory bodies including the CDC and  federal and state organizations. These policies and algorithms were followed during the patient's care in the ED.   Labs Reviewed  RESP PANEL BY RT-PCR (FLU A&B, COVID) ARPGX2 - Abnormal; Notable for the following components:      Result Value   SARS Coronavirus 2 by RT PCR POSITIVE (*)    All other components within normal limits  BASIC METABOLIC PANEL - Abnormal; Notable for the following components:   Potassium 3.4 (*)    CO2 20 (*)    Glucose, Bld 129 (*)    Creatinine, Ser 1.15 (*)    Calcium 8.7 (*)    GFR, Estimated 59 (*)    All other components within normal limits  CBC - Abnormal; Notable for the following components:   HCT 46.7 (*)    All other components within normal limits    DG Chest 2 View  Result Date: 10/25/2020 CLINICAL DATA:  Cough, shortness of breath and sneezing for 4 days. EXAM: CHEST - 2 VIEW COMPARISON:  PA and lateral chest 08/13/2017. FINDINGS: The lungs are clear. Mild coarsening of the pulmonary interstitium is unchanged. No pneumothorax or pleural fluid. Heart size is normal. No acute or focal bony abnormality. IMPRESSION: No acute disease. Electronically Signed   By: 08/15/2017 M.D.   On: 10/25/2020 10:06   CT Angio Chest PE W and/or Wo Contrast  Result Date: 10/25/2020 CLINICAL DATA:  CTA Chest PE Study Patient  for evaluation of SOB, fever, and CP with deep breaths x 5 days EXAM: CT ANGIOGRAPHY CHEST WITH CONTRAST TECHNIQUE: Multidetector CT imaging of the chest was performed using the standard protocol during bolus administration of intravenous contrast. Multiplanar CT image reconstructions and MIPs were obtained to evaluate the vascular anatomy. CONTRAST:  66mL OMNIPAQUE IOHEXOL 350 MG/ML SOLN COMPARISON:  Chest x-ray 10/25/2020. FINDINGS: Cardiovascular: Satisfactory opacification of the pulmonary arteries to the segmental level. No evidence of pulmonary embolism. The main pulmonary artery is normal in caliber. Normal heart size. No significant  pericardial effusion. The thoracic aorta is normal in caliber. No atherosclerotic plaque of the thoracic aorta. No coronary artery calcifications. Mediastinum/Nodes: A 1 cm right paratracheal, a 1cm prevascular, a 1.1cm right hilar, a 1cm left hilar lymph node are noted.no enlarged mediastinal, hilar, or axillary lymph nodes. Thyroid gland, trachea, and esophagus demonstrate no significant findings. Small hiatal hernia. Lungs/Pleura: Diffuse patchy airspace opacities. No pulmonary mass. No pleural effusion. No pneumothorax. Upper Abdomen: No acute abnormality. Musculoskeletal: No chest wall abnormality. No acute or significant osseous findings. Review of the MIP images confirms the above findings. IMPRESSION: 1. No central or segmental pulmonary embolus. 2. Multifocal pneumonia consistent with COVID-19 infection. Associated mediastinal and bilateral hilar lymphadenopathy likely reactive in etiology. 3. Small hiatal hernia. Electronically Signed   By: Tish Frederickson M.D.   On: 10/25/2020 19:45       Cristina Gong, PA-C 10/25/20 2045    Derwood Kaplan, MD 10/26/20 [phone]

## 2020-10-25 NOTE — Discharge Instructions (Addendum)
Please get a pulse oxygen sensor.  If your oxygen number is dipping into the 80s and you need to return to the emergency room for additional evaluation.  As we discussed there is a chance that you may get worse before you get better. Please do not hesitate to return to the emergency room or seek additional medical care.  Please take Tylenol (acetaminophen) to relieve your pain.  You may take tylenol, up to 1,000 mg (two extra strength pills).  Do not take more than 3,000 mg tylenol in a 24 hour period.  Please check all medication labels as many medications such as pain and cold medications may contain tylenol. Please do not drink alcohol while taking this medication.

## 2020-10-25 NOTE — ED Provider Notes (Signed)
[facility] EMERGENCY DEPARTMENT Provider Note   CSN: [id number] Arrival date & time: 10/25/20  4034     History Chief Complaint  Patient presents with  . Shortness of Breath    Terri Richardson is a 50 y.o. female who presents emergency department for flulike symptoms.  She is an onset of symptoms 5 days ago.  She complains of body aches, painful cough, cough that is productive of phlegm, fevers.  She states that she feels like her symptoms are getting worse.  She has a history of asthma and feels like she is unable to take a deep breath to get her inhaler medications and because her chest hurts when she breathes deeply.  She denies hemoptysis, unilateral leg swelling, history of DVT or PE.  HPI     Past Medical History:  Diagnosis Date  . Allergy   . Asthma   . Depression   . Diabetes mellitus 2006  . GERD (gastroesophageal reflux disease)   . Hypertension     Patient Active Problem List   Diagnosis Date Noted  . Hyperlipidemia associated with type 2 diabetes mellitus (HCC) 03/28/2019  . Recurrent major depression in remission (HCC) 05/24/2018  . Anxiety disorder 09/07/2017  . Morbid obesity (HCC) 03/14/2016  . Asthmatic bronchitis , chronic (HCC) 01/26/2016  . Dyspnea 01/25/2016  . Essential hypertension 01/25/2016  . Type 2 diabetes mellitus (HCC) 01/03/2016  . Elevated blood pressure 01/03/2016  . Mild intermittent reactive airway disease with wheezing with acute exacerbation 01/03/2016  . Acute cholecystitis 11/12/2011    Past Surgical History:  Procedure Laterality Date  . APPENDECTOMY  1st grade  . CESAREAN SECTION  2008  . CHOLECYSTECTOMY  11/11/2011   Procedure: LAPAROSCOPIC CHOLECYSTECTOMY WITH INTRAOPERATIVE CHOLANGIOGRAM;  Surgeon: Jetty Duhamel, MD;  Location: [facility] OR;  Service: General;  Laterality: N/A;  . TONSILLECTOMY  6th grade     OB History   No obstetric history on file.     Family History  Problem Relation Age of  Onset  . Hyperlipidemia Mother   . Hypertension Father   . Tongue cancer Maternal Grandfather   . Prostate cancer Paternal Uncle   . Kidney cancer Maternal Uncle   . Heart disease Brother   . Hypertension Brother     Social History   Tobacco Use  . Smoking status: Former Smoker    Packs/day: 0.50    Years: 4.00    Pack years: 2.00    Types: Cigarettes    Quit date: 02/20/2014    Years since quitting: 6.6  . Smokeless tobacco: Never Used  Substance Use Topics  . Alcohol use: No    Alcohol/week: 0.0 standard drinks  . Drug use: No    Home Medications Prior to Admission medications   Medication Sig Start Date End Date Taking? Authorizing Provider  albuterol (VENTOLIN HFA) 108 (90 Base) MCG/ACT inhaler Inhale 2 puffs into the lungs every 6 (six) hours as needed for wheezing or shortness of breath. 03/10/20   [place], Betty G, MD  cetirizine (ZYRTEC) 10 MG tablet Take 1 tablet (10 mg total) by mouth daily. 04/04/19   Deeann Saint, MD  Fluticasone-Salmeterol [facility] INHUB) 250-50 MCG/DOSE AEPB INHALE 1 PUFF BY MOUTH TWICE A DAY 03/15/20   [place], Betty G, MD  ibuprofen (ADVIL,MOTRIN) 600 MG tablet Take 1 tablet (600 mg total) by mouth every 6 (six) hours as needed. 04/26/16   Lyndal Pulley, MD  losartan (COZAAR) 25 MG tablet TAKE ONE  TABLET BY MOUTH ONE TIME DAILY 07/20/20   [place], Betty G, MD  montelukast (SINGULAIR) 10 MG tablet TAKE 1 TABLET BY MOUTH EVERYDAY AT BEDTIME 04/27/20   [place], Betty G, MD  omeprazole (PRILOSEC) 20 MG capsule Take 1 capsule (20 mg total) by mouth daily. 08/23/16   Roddie Mc, FNP  pravastatin (PRAVACHOL) 20 MG tablet Take 1 tablet (20 mg total) by mouth daily. 03/10/20   [place], Betty G, MD  venlafaxine XR (EFFEXOR-XR) 75 MG 24 hr capsule TAKE ONE CAPSULE BY MOUTH EVERY MORNING WITH BREAKFAST 07/20/20   [place], Betty G, MD    Allergies    Patient has no known allergies.  Review of Systems   Review of Systems Ten systems reviewed and are negative  for acute change, except as noted in the HPI.   Physical Exam Updated Vital Signs BP 113/80   Pulse 86   Temp 99.3 F (37.4 C)   Resp 15   LMP 10/18/2020   SpO2 96%   Physical Exam Vitals and nursing note reviewed.  Constitutional:      General: She is not in acute distress.    Appearance: She is well-developed and well-nourished. She is not diaphoretic.  HENT:     Head: Normocephalic and atraumatic.  Eyes:     General: No scleral icterus.    Conjunctiva/sclera: Conjunctivae normal.  Cardiovascular:     Rate and Rhythm: Normal rate and regular rhythm.     Heart sounds: Normal heart sounds. No murmur heard. No friction rub. No gallop.   Pulmonary:     Effort: Pulmonary effort is normal. No respiratory distress.     Breath sounds: Rhonchi present. No wheezing.  Abdominal:     General: Bowel sounds are normal. There is no distension.     Palpations: Abdomen is soft. There is no mass.     Tenderness: There is no abdominal tenderness. There is no guarding.  Musculoskeletal:     Cervical back: Normal range of motion.  Skin:    General: Skin is warm and dry.  Neurological:     Mental Status: She is alert and oriented to person, place, and time.  Psychiatric:        Behavior: Behavior normal.     ED Results / Procedures / Treatments   Labs (all labs ordered are listed, but only abnormal results are displayed) Labs Reviewed  RESP PANEL BY RT-PCR (FLU A&B, COVID) ARPGX2 - Abnormal; Notable for the following components:      Result Value   SARS Coronavirus 2 by RT PCR POSITIVE (*)    All other components within normal limits  BASIC METABOLIC PANEL - Abnormal; Notable for the following components:   Potassium 3.4 (*)    CO2 20 (*)    Glucose, Bld 129 (*)    Creatinine, Ser 1.15 (*)    Calcium 8.7 (*)    GFR, Estimated 59 (*)    All other components within normal limits  CBC - Abnormal; Notable for the following components:   HCT 46.7 (*)    All other components  within normal limits    EKG EKG Interpretation  Date/Time:  Monday October 25 2020 09:34:03 EST Ventricular Rate:  113 PR Interval:  118 QRS Duration: 68 QT Interval:  314 QTC Calculation: 430 R Axis:   17 Text Interpretation: Sinus tachycardia Cannot rule out Anterior infarct , age undetermined Abnormal ECG Confirmed by Gilda Crease [phone]) on 10/26/2020 3:50:07 PM   Radiology DG Chest  2 View  Result Date: 10/25/2020 CLINICAL DATA:  Cough, shortness of breath and sneezing for 4 days. EXAM: CHEST - 2 VIEW COMPARISON:  PA and lateral chest 08/13/2017. FINDINGS: The lungs are clear. Mild coarsening of the pulmonary interstitium is unchanged. No pneumothorax or pleural fluid. Heart size is normal. No acute or focal bony abnormality. IMPRESSION: No acute disease. Electronically Signed   By: Inge Rise M.D.   On: 10/25/2020 10:06    Procedures Procedures (including critical care time)  Medications Ordered in ED Medications  acetaminophen (TYLENOL) tablet 1,000 mg (1,000 mg Oral Given 10/25/20 0951)  sodium chloride 0.9 % bolus 1,000 mL (1,000 mLs Intravenous New Bag/Given 10/25/20 1724)    ED Course  I have reviewed the triage vital signs and the nursing notes.  Pertinent labs & imaging results that were available during my care of the patient were reviewed by me and considered in my medical decision making (see chart for details).    MDM Rules/Calculators/A&P                         Patient here with complaint of flulike symptoms.  Covid test is positive.  Labs show no elevated white blood cell count, BMP with mildly elevated creatinine of insignificant value.  I ordered and reviewed two-view chest x-ray which shows no acute abnormalities on my interpretation.  EKG shows sinus tachycardia at a rate of 113.  Patient also complaining of pleuritic chest pain.  CT angiogram currently pending to rule out pulmonary embolus.  I have given signout to [place] who will assume  care of the patient Asenath Balash was evaluated in Emergency Department on 10/26/2020 for the symptoms described in the history of present illness. She was evaluated in the context of the global COVID-19 pandemic, which necessitated consideration that the patient might be at risk for infection with the SARS-CoV-2 virus that causes COVID-19. Institutional protocols and algorithms that pertain to the evaluation of patients at risk for COVID-19 are in a state of rapid change based on information released by regulatory bodies including the CDC and federal and state organizations. These policies and algorithms were followed during the patient's care in the ED.  Final Clinical Impression(s) / ED Diagnoses Final diagnoses:  SOB (shortness of breath)    Rx / DC Orders ED Discharge Orders    None       Margarita Mail, PA-C 10/26/20 [place], [place], MD 10/29/20 [phone]

## 2020-10-25 NOTE — ED Notes (Signed)
Pt ambulated in room; O2 sat steady at 92-94% with mild SOB; O2 sat remains at 92-94% upon return to bed; provider advised.

## 2020-10-25 NOTE — ED Triage Notes (Signed)
Pt for eval of shob, fever, and pain with deep breaths x 5 days.

## 2020-11-22 ENCOUNTER — Other Ambulatory Visit: Payer: Self-pay | Admitting: Family Medicine

## 2020-11-22 DIAGNOSIS — J4520 Mild intermittent asthma, uncomplicated: Secondary | ICD-10-CM

## 2020-11-25 ENCOUNTER — Other Ambulatory Visit: Payer: Self-pay | Admitting: Family Medicine

## 2020-11-25 DIAGNOSIS — J4520 Mild intermittent asthma, uncomplicated: Secondary | ICD-10-CM

## 2020-11-25 NOTE — Telephone Encounter (Signed)
Pt is calling in stating that she has new insurance and they will not allow her to use [facility] for her medications and would like to see if Rx Fluticasone-Salmeterol (ADVAIR)250-50 MCG can be cancelled and sent to the new pharmacy.  Pharm:  [facility] [address]  --[facility] [address] [facility] [employer])

## 2020-12-27 ENCOUNTER — Telehealth: Payer: Self-pay | Admitting: Family Medicine

## 2020-12-27 DIAGNOSIS — J4520 Mild intermittent asthma, uncomplicated: Secondary | ICD-10-CM

## 2020-12-27 MED ORDER — FLUTICASONE-SALMETEROL 250-50 MCG/DOSE IN AEPB: INHALATION_SPRAY | RESPIRATORY_TRACT | 2 refills | Status: DC

## 2020-12-27 NOTE — Telephone Encounter (Signed)
Rx sent 

## 2020-12-27 NOTE — Telephone Encounter (Signed)
Patient is calling and requesting a refill for WIXELA INHUB 250-50 MCG/DOSE AEPB to be sent to [facility] on [address], please advise. CB is [phone]

## 2021-01-25 ENCOUNTER — Other Ambulatory Visit: Payer: Self-pay

## 2021-01-25 ENCOUNTER — Ambulatory Visit (INDEPENDENT_AMBULATORY_CARE_PROVIDER_SITE_OTHER): Payer: BC Managed Care – PPO | Admitting: Family Medicine

## 2021-01-25 ENCOUNTER — Encounter: Payer: Self-pay | Admitting: Family Medicine

## 2021-01-25 VITALS — BP 124/70 | HR 112 | Resp 16 | Ht 62.0 in | Wt 194.0 lb

## 2021-01-25 DIAGNOSIS — I10 Essential (primary) hypertension: Secondary | ICD-10-CM

## 2021-01-25 DIAGNOSIS — E1169 Type 2 diabetes mellitus with other specified complication: Secondary | ICD-10-CM | POA: Diagnosis not present

## 2021-01-25 DIAGNOSIS — L659 Nonscarring hair loss, unspecified: Secondary | ICD-10-CM

## 2021-01-25 DIAGNOSIS — F419 Anxiety disorder, unspecified: Secondary | ICD-10-CM

## 2021-01-25 DIAGNOSIS — E785 Hyperlipidemia, unspecified: Secondary | ICD-10-CM

## 2021-01-25 DIAGNOSIS — J4521 Mild intermittent asthma with (acute) exacerbation: Secondary | ICD-10-CM

## 2021-01-25 MED ORDER — MONTELUKAST SODIUM 10 MG PO TABS: ORAL_TABLET | ORAL | 3 refills | Status: DC

## 2021-01-25 MED ORDER — LOSARTAN POTASSIUM 25 MG PO TABS: 25.0000 mg | ORAL_TABLET | Freq: Every day | ORAL | 2 refills | Status: DC

## 2021-01-25 MED ORDER — PRAVASTATIN SODIUM 20 MG PO TABS: 20.0000 mg | ORAL_TABLET | Freq: Every day | ORAL | 2 refills | Status: DC

## 2021-01-25 MED ORDER — VENLAFAXINE HCL ER 75 MG PO CP24: ORAL_CAPSULE | ORAL | 3 refills | Status: DC

## 2021-01-25 NOTE — Assessment & Plan Note (Signed)
Stable. Continue Effexor XR 75 mg daily.

## 2021-01-25 NOTE — Assessment & Plan Note (Signed)
Continue Pravastatin 20 mg daily. She is not fasting today, so we will plan on checking FLP next visit.

## 2021-01-25 NOTE — Progress Notes (Signed)
Chief Complaint  Patient presents with  . Alopecia    Started a month ago. Only change pt has had is her insurance won't cover the generic advair any longer so she had to switch to the name brand.    HPI: [name] is a 50 y.o. female, who is here today with above complaint. Exacerbated by brushing her hair, mainly after watching it. No areas with alopecia but she can see wider gaps/scalp, mainly on temporoparietal areas. She has lost "50%" of her hair. She has pictures.  Negative for FHx of alopecia. She has not noted hair loss or changes in hair distribution on other area of her body. She has not tried OTC medications.  + Fatigue, which she attributes to shift work changes.  Negative for recent stressful event or serious illness. COVID 19 infection in 10/2020.  Regular menses. No associated skin rash or arthralgias. She has noted finger nail bridges.  Anxiety on Effexor XR 75 mg, which is helping. Denies depressed mood.  DM II: Dx'ed in 2006. She was on Metformin. She is on non pharmacologic treatment. Negative for polydipsia,polyuria, or polyphagia.  Lab Results  Component Value Date   HGBA1C 6.2 03/15/2020   HTN: She is on Losartan 25 mg daily. Negative for severe/frequent headache, visual changes, chest pain, palpitation, focal weakness, or edema.  Lab Results  Component Value Date   CREATININE 1.15 (H) 10/25/2020   BUN 16 10/25/2020   NA 136 10/25/2020   K 3.4 (L) 10/25/2020   CL 103 10/25/2020   CO2 20 (L) 10/25/2020   HLD on Pravastatin 20 mg daily.  She switched pharmacy, so needs refills of all her meds. She was last seen on 03/15/20.  Lab Results  Component Value Date   CHOL 133 08/04/2019   HDL 59.60 08/04/2019   LDLCALC 53 08/04/2019   TRIG 100.0 08/04/2019   CHOLHDL 2 08/04/2019   Noted diffused wheezing on auscultation. She is on Fluticasone-Salmeterol 250-50 mcg bid and Singulair 10 mg daily. She also takes Zyrtec 10 mg  daily. Wheezing and dyspnea exacerbated by exercise. Occasional cough. Former smoker.  Albuterol inh a few minutes before exercise does not help much.  Review of Systems  Constitutional: Positive for fatigue. Negative for activity change, appetite change and fever.  HENT: Negative for mouth sores and nosebleeds.   Respiratory: Negative for wheezing.   Gastrointestinal: Negative for abdominal pain, nausea and vomiting.       Negative for changes in bowel habits.  Genitourinary: Negative for decreased urine volume and hematuria.  Musculoskeletal: Negative for gait problem and myalgias.  Allergic/Immunologic: Positive for environmental allergies.  Neurological: Negative for syncope, facial asymmetry and weakness.  Rest see pertinent positives and negatives per HPI.  Current Outpatient Medications on File Prior to Visit  Medication Sig Dispense Refill  . albuterol (VENTOLIN HFA) 108 (90 Base) MCG/ACT inhaler Inhale 2 puffs into the lungs every 6 (six) hours as needed for wheezing or shortness of breath. 18 g 3  . cetirizine (ZYRTEC) 10 MG tablet Take 1 tablet (10 mg total) by mouth daily. 30 tablet 3  . Fluticasone-Salmeterol (WIXELA INHUB) 250-50 MCG/DOSE AEPB TAKE 1 PUFF BY MOUTH TWICE A DAY 180 each 2  . ibuprofen (ADVIL,MOTRIN) 600 MG tablet Take 1 tablet (600 mg total) by mouth every 6 (six) hours as needed. 30 tablet 0  . omeprazole (PRILOSEC) 20 MG capsule Take 1 capsule (20 mg total) by mouth daily. 90 capsule 3   No  current facility-administered medications on file prior to visit.   Past Medical History:  Diagnosis Date  . Allergy   . Asthma   . Depression   . Diabetes mellitus 2006  . GERD (gastroesophageal reflux disease)   . Hypertension    No Known Allergies  Social History   Socioeconomic History  . Marital status: Married    Spouse name: Not on file  . Number of children: Not on file  . Years of education: Not on file  . Highest education level: Not on file   Occupational History  . Occupation: Works at [employer]  . Smoking status: Former Smoker    Packs/day: 0.50    Years: 4.00    Pack years: 2.00    Types: Cigarettes    Quit date: 02/20/2014    Years since quitting: 6.9  . Smokeless tobacco: Never Used  Substance and Sexual Activity  . Alcohol use: No    Alcohol/week: 0.0 standard drinks  . Drug use: No  . Sexual activity: Yes    Birth control/protection: None  Other Topics Concern  . Not on file  Social History Narrative  . Not on file   Social Determinants of Health   Financial Resource Strain: Not on file  Food Insecurity: Not on file  Transportation Needs: Not on file  Physical Activity: Not on file  Stress: Not on file  Social Connections: Not on file   Vitals:   01/25/21 1453  BP: 124/70  Pulse: (!) 112  Resp: 16  SpO2: 94%   Body mass index is 35.48 kg/m.  Physical Exam Vitals and nursing note reviewed.  Constitutional:      General: She is not in acute distress.    Appearance: She is well-developed.  HENT:     Head: Normocephalic and atraumatic.     Mouth/Throat:     Mouth: Mucous membranes are moist.  Eyes:     Conjunctiva/sclera: Conjunctivae normal.  Cardiovascular:     Rate and Rhythm: Normal rate and regular rhythm.     Heart sounds: No murmur heard.   Pulmonary:     Effort: Pulmonary effort is normal. No respiratory distress.     Breath sounds: Wheezing present. No rhonchi or rales.  Abdominal:     Palpations: Abdomen is soft. There is no hepatomegaly or mass.     Tenderness: There is no abdominal tenderness.  Lymphadenopathy:     Cervical: No cervical adenopathy.  Skin:    General: Skin is warm.     Findings: No erythema or rash.     Comments: Thin and gray hair on temporoparietal areas.  Pulling hair test 2-3 hairs at the time.  Neurological:     General: No focal deficit present.     Mental Status: She is alert and oriented to person, place, and time.     Cranial  Nerves: No cranial nerve deficit.     Gait: Gait normal.  Psychiatric:     Comments: Well groomed, good eye contact.   ASSESSMENT AND PLAN:  [name] was seen today for alopecia.  Diagnoses and all orders for this visit: Orders Placed This Encounter  Procedures  . CBC  . TSH  . Hemoglobin A1c  . Comprehensive metabolic panel  . Ferritin  . ANA  . C-reactive protein  . Anti-nuclear ab-titer (ANA titer)  . Ambulatory referral to Immunology   Lab Results  Component Value Date   HGBA1C 6.0 01/25/2021   Lab Results  Component Value Date   WBC 9.7 01/25/2021   HGB 14.6 01/25/2021   HCT 44.1 01/25/2021   MCV 92.1 01/25/2021   PLT 307.0 01/25/2021   Lab Results  Component Value Date   TSH 2.21 01/25/2021   Lab Results  Component Value Date   CREATININE 1.18 01/25/2021   BUN 18 01/25/2021   NA 140 01/25/2021   K 4.1 01/25/2021   CL 106 01/25/2021   CO2 22 01/25/2021   Lab Results  Component Value Date   ALT 11 01/25/2021   AST 10 01/25/2021   ALKPHOS 56 01/25/2021   BILITOT 0.4 01/25/2021   Lab Results  Component Value Date   CRP <1.0 01/25/2021   Hair loss disorder We discussed possible etiologies. Problem could have been related to COVID 19 illness. ? Telogen effluvium. OTC Rogaine may help. Further recommendations according to lab results. We could consider derma evaluation.  Type 2 diabetes mellitus (HCC) Problem has been well controlled. Continue non pharmacologic treatment. Annual eye exam and appropriate foot care.  Essential hypertension BP adequately controlled. Continue Losartan 25 mg daily and low salt diet.  Mild intermittent reactive airway disease with wheezing with acute exacerbation Problem has not been well controlled. She may benefit from immunotherapy. She agrees with immunologist evaluation to discuss other treatment options.  Hyperlipidemia associated with type 2 diabetes mellitus (HCC) Continue Pravastatin 20 mg  daily. She is not fasting today, so we will plan on checking FLP next visit.  Anxiety disorder Stable. Continue Effexor XR 75 mg daily.   Return in about 6 months (around 07/27/2021) for cpe.   Tierre Netto G. [place], MD  [facility]. [place] office.   A few things to remember from today's visit:   Hair loss disorder - Plan: CBC, TSH, Ferritin, ANA, C-reactive protein  Essential hypertension - Plan: losartan (COZAAR) 25 MG tablet, Comprehensive metabolic panel  Mild intermittent reactive airway disease with wheezing with acute exacerbation - Plan: montelukast (SINGULAIR) 10 MG tablet, Ambulatory referral to Immunology  Type 2 diabetes mellitus with other specified complication, without long-term current use of insulin (HCC) - Plan: pravastatin (PRAVACHOL) 20 MG tablet, Hemoglobin A1c  Anxiety disorder, unspecified type - Plan: venlafaxine XR (EFFEXOR-XR) 75 MG 24 hr capsule  If you need refills please call your pharmacy. Do not use My Chart to request refills or for acute issues that need immediate attention.   Over the counter Rogaine may help with hair loss. If labs are otherwise normal, we could consider derma evaluation.  Please be sure medication list is accurate. If a new problem present, please set up appointment sooner than planned today.

## 2021-01-25 NOTE — Assessment & Plan Note (Signed)
Problem has not been well controlled. She may benefit from immunotherapy. She agrees with immunologist evaluation to discuss other treatment options.

## 2021-01-25 NOTE — Assessment & Plan Note (Addendum)
BP adequately controlled. Continue Losartan 25 mg daily and low salt diet. 

## 2021-01-25 NOTE — Patient Instructions (Signed)
A few things to remember from today's visit:   Hair loss disorder - Plan: CBC, TSH, Ferritin, ANA, C-reactive protein  Essential hypertension - Plan: losartan (COZAAR) 25 MG tablet, Comprehensive metabolic panel  Mild intermittent reactive airway disease with wheezing with acute exacerbation - Plan: montelukast (SINGULAIR) 10 MG tablet, Ambulatory referral to Immunology  Type 2 diabetes mellitus with other specified complication, without long-term current use of insulin (HCC) - Plan: pravastatin (PRAVACHOL) 20 MG tablet, Hemoglobin A1c  Anxiety disorder, unspecified type - Plan: venlafaxine XR (EFFEXOR-XR) 75 MG 24 hr capsule  If you need refills please call your pharmacy. Do not use My Chart to request refills or for acute issues that need immediate attention.   Over the counter Rogaine may help with hair loss. If labs are otherwise normal, we could consider derma evaluation.  Please be sure medication list is accurate. If a new problem present, please set up appointment sooner than planned today.

## 2021-01-25 NOTE — Assessment & Plan Note (Addendum)
Problem has been well controlled. Continue non pharmacologic treatment. Annual eye exam and appropriate foot care.

## 2021-01-26 LAB — COMPREHENSIVE METABOLIC PANEL
ALT: 11 U/L (ref 0–35)
AST: 10 U/L (ref 0–37)
Albumin: 4.4 g/dL (ref 3.5–5.2)
Alkaline Phosphatase: 56 U/L (ref 39–117)
BUN: 18 mg/dL (ref 6–23)
CO2: 22 mEq/L (ref 19–32)
Calcium: 10 mg/dL (ref 8.4–10.5)
Chloride: 106 mEq/L (ref 96–112)
Creatinine, Ser: 1.18 mg/dL (ref 0.40–1.20)
GFR: 54.79 mL/min — ABNORMAL LOW (ref >60.00)
Glucose, Bld: 94 mg/dL (ref 70–99)
Potassium: 4.1 mEq/L (ref 3.5–5.1)
Sodium: 140 mEq/L (ref 135–145)
Total Bilirubin: 0.4 mg/dL (ref 0.2–1.2)
Total Protein: 6.9 g/dL (ref 6.0–8.3)

## 2021-01-26 LAB — CBC
HCT: 44.1 % (ref 36.0–46.0)
Hemoglobin: 14.6 g/dL (ref 12.0–15.0)
MCHC: 33.1 g/dL (ref 30.0–36.0)
MCV: 92.1 fl (ref 78.0–100.0)
Platelets: 307 10*3/uL (ref 150.0–400.0)
RBC: 4.79 Mil/uL (ref 3.87–5.11)
RDW: 13.4 % (ref 11.5–15.5)
WBC: 9.7 10*3/uL (ref 4.0–10.5)

## 2021-01-26 LAB — HEMOGLOBIN A1C: Hgb A1c MFr Bld: 6 % (ref 4.6–6.5)

## 2021-01-26 LAB — FERRITIN: Ferritin: 44.9 ng/mL (ref 10.0–291.0)

## 2021-01-26 LAB — TSH: TSH: 2.21 u[IU]/mL (ref 0.35–4.50)

## 2021-01-26 LAB — C-REACTIVE PROTEIN: CRP: <1 mg/dL (ref 0.5–20.0)

## 2021-01-27 LAB — ANTI-NUCLEAR AB-TITER (ANA TITER): ANA Titer 1: 1:160 {titer} — ABNORMAL HIGH

## 2021-01-27 LAB — ANA: Anti Nuclear Antibody (ANA): POSITIVE — AB

## 2021-01-31 ENCOUNTER — Ambulatory Visit: Payer: BC Managed Care – PPO | Admitting: Allergy & Immunology

## 2021-01-31 ENCOUNTER — Other Ambulatory Visit: Payer: Self-pay

## 2021-01-31 ENCOUNTER — Encounter: Payer: Self-pay | Admitting: Allergy & Immunology

## 2021-01-31 VITALS — BP 134/90 | HR 82 | Temp 98.2°F | Resp 12 | Ht 62.0 in | Wt 191.8 lb

## 2021-01-31 DIAGNOSIS — R062 Wheezing: Secondary | ICD-10-CM | POA: Diagnosis not present

## 2021-01-31 DIAGNOSIS — J4550 Severe persistent asthma, uncomplicated: Secondary | ICD-10-CM | POA: Diagnosis not present

## 2021-01-31 DIAGNOSIS — B999 Unspecified infectious disease: Secondary | ICD-10-CM | POA: Diagnosis not present

## 2021-01-31 DIAGNOSIS — J310 Chronic rhinitis: Secondary | ICD-10-CM

## 2021-01-31 MED ORDER — BREZTRI AEROSPHERE 160-9-4.8 MCG/ACT IN AERO
2.0000 | INHALATION_SPRAY | Freq: Two times a day (BID) | RESPIRATORY_TRACT | 5 refills | Status: DC
Start: 2021-01-31 — End: 2021-06-29

## 2021-01-31 MED ORDER — FEXOFENADINE HCL 180 MG PO TABS: 180.0000 mg | ORAL_TABLET | Freq: Every day | ORAL | 5 refills | Status: DC

## 2021-01-31 MED ORDER — FLUTICASONE PROPIONATE 50 MCG/ACT NA SUSP: 2.0000 | Freq: Every day | NASAL | 5 refills | Status: DC

## 2021-01-31 NOTE — Patient Instructions (Addendum)
1. Severe persistent asthma, uncomplicated - Lung testing looked terrible and it actually got worse after albuterol treatment. - Because of this, I did not want to do skin testing. - Stop the Advair and start Breztri 2 puffs twice daily with a spacer. - Breztri contains 3 medications to help with your asthma. - We are going to get some lab work to look for serious difficult to control causes of your asthma. - I think that we need to start a biologic or injectable medicine for your asthma. - The labs will help determine which one would go with. - Spacer sample and demonstration provided. - Daily controller medication(s): Breztri 2 puffs twice daily with a spacer - Prior to physical activity: albuterol 2 puffs 10-15 minutes before physical activity. - Rescue medications: albuterol 4 puffs every 4-6 hours as needed - Asthma control goals:  * Full participation in all desired activities (may need albuterol before activity) * Albuterol use two time or less a week on average (not counting use with activity) * Cough interfering with sleep two time or less a month * Oral steroids no more than once a year * No hospitalizations  2. Chronic rhinitis - We did not do testing because of your breathing. - We we will get blood work instead. - Stop the Claritin and start Allegra 180 mg 1 tablet once daily (this 1 works better than Claritin and still has less sleepiness than Zyrtec).  - Start Flonase 2 sprays per nostril once daily. - I am also going to get some immune labs because you needed antibiotics so frequently.  3. Return in about 4 weeks (around 02/28/2021).    Please inform us of any Emergency Department visits, hospitalizations, or changes in symptoms. Call us before going to the ED for breathing or allergy symptoms since we might be able to fit you in for a sick visit. Feel free to contact us anytime with any questions, problems, or concerns.  It was a pleasure to meet you today!  Websites  that have reliable patient information: 1. American Academy of Asthma, Allergy, and Immunology: www.aaaai.org 2. Food Allergy Research and Education (FARE): foodallergy.org 3. Mothers of Asthmatics: http://www.asthmacommunitynetwork.org 4. American College of Allergy, Asthma, and Immunology: www.acaai.org   COVID-19 Vaccine Information can be found at: [email or web address] For questions related to vaccine distribution or appointments, please email [email or web address] or call [phone].   We realize that you might be concerned about having an allergic reaction to the COVID19 vaccines. To help with that concern, WE ARE OFFERING THE COVID19 VACCINES IN OUR OFFICE! Ask the front desk for dates!     "Like" [place] on Facebook and Instagram for our latest updates!      A healthy democracy works best when [employer] participate! Make sure you are registered to vote! If you have moved or changed any of your contact information, you will need to get this updated before voting!  In some cases, you MAY be able to register to vote online: [email or web address]

## 2021-01-31 NOTE — Progress Notes (Signed)
NEW PATIENT  Date of Service/Encounter:  01/31/21  Consult requested by: [place], Betty G, MD   Assessment:   Severe persistent asthma, uncomplicated - likely biologic candidates with AEC of 1200 or more  Chronic rhinitis  Recurrent infections  Plan/Recommendations:   1. Severe persistent asthma, uncomplicated - Lung testing looked terrible and it actually got worse after albuterol treatment. - Because of this, I did not want to do skin testing. - Stop the Advair and start Breztri 2 puffs twice daily with a spacer. - Breztri contains 3 medications to help with your asthma. - We are going to get some lab work to look for serious difficult to control causes of your asthma. - I think that we need to start a biologic or injectable medicine for your asthma. - The labs will help determine which one would go with. - Spacer sample and demonstration provided. - Daily controller medication(s): Breztri 2 puffs twice daily with a spacer - Prior to physical activity: albuterol 2 puffs 10-15 minutes before physical activity. - Rescue medications: albuterol 4 puffs every 4-6 hours as needed - Asthma control goals:  * Full participation in all desired activities (may need albuterol before activity) * Albuterol use two time or less a week on average (not counting use with activity) * Cough interfering with sleep two time or less a month * Oral steroids no more than once a year * No hospitalizations  2. Chronic rhinitis - We did not do testing because of your breathing. - We we will get blood work instead. - Stop the Claritin and start Allegra 180 mg 1 tablet once daily (this 1 works better than Claritin and still has less sleepiness than Zyrtec).  - Start Flonase 2 sprays per nostril once daily. - I am also going to get some immune labs because you needed antibiotics so frequently.  3. Return in about 4 weeks (around 02/28/2021).    This note in its entirety was forwarded to the  Provider who requested this consultation.  Subjective:   Terri Richardson is a 50 y.o. female presenting today for evaluation of  Chief Complaint  Patient presents with  . Cough  . Wheezing    Terri Richardson has a history of the following: Patient Active Problem List   Diagnosis Date Noted  . Hyperlipidemia associated with type 2 diabetes mellitus (HCC) 03/28/2019  . Recurrent major depression in remission (HCC) 05/24/2018  . Anxiety disorder 09/07/2017  . Morbid obesity (HCC) 03/14/2016  . Asthmatic bronchitis , chronic (HCC) 01/26/2016  . Dyspnea 01/25/2016  . Essential hypertension 01/25/2016  . Type 2 diabetes mellitus (HCC) 01/03/2016  . Elevated blood pressure 01/03/2016  . Mild intermittent reactive airway disease with wheezing with acute exacerbation 01/03/2016  . Acute cholecystitis 11/12/2011    History obtained from: chart review and patient.  Terri Richardson was referred by Sw[place], Betty G, MD.     Terri Richardson is a 50 y.o. female presenting for an evaluation of asthma and allergies.   Asthma/Respiratory Symptom History: She does have a diagnosis of asthma. She has a diagnosis of asthma for the past 2-3 years. She was diagnosed by an NP who since left the practice. She was then transferred to Dr. Swaziland as her PCP. She was initially on Symbicort and now she is on Advair BID. She does not feel that any of these are working. She tells me that she has just gotten used to "wheezing all of the time". There are some  good days when she does not wheeze. She reports that she is "wheezing all of the time". She has a constant cough and wheeze. She has never been allergy tested.   She was a smoker for a number of years, around two years and not very consistent. She was seen by Dr. Sherene SiresWert in 2017 and told her that her breathing was as stable as it was going to be, per the patient. She has had elevated eosinophil counts, upwards of 1200 or more, but none within the last year that are more  than 100. She has never been on a biologic at all.   Full pulmonary function testing (May 2017):   Allergic Rhinitis Symptom History: There was discussion of doing allergy testing earlier but she never got this done. She reports that she has itchy eyes and rhinorrhea throughout the year. She is on Claritin daily which she has been on since the NP managed her breathing. She has never been skin tested in the past, but she did have blood work done in 2017 which was negative.   She did receive antibiotics frequently when she was being followed by the NP. This has decreased lately since being followed by Dr. SwazilandJordan.    Otherwise, there is no history of other atopic diseases, including food allergies, drug allergies, stinging insect allergies, urticaria or contact dermatitis. There is no significant infectious history. Vaccinations are up to date.    Past Medical History: Patient Active Problem List   Diagnosis Date Noted  . Hyperlipidemia associated with type 2 diabetes mellitus (HCC) 03/28/2019  . Recurrent major depression in remission (HCC) 05/24/2018  . Anxiety disorder 09/07/2017  . Morbid obesity (HCC) 03/14/2016  . Asthmatic bronchitis , chronic (HCC) 01/26/2016  . Dyspnea 01/25/2016  . Essential hypertension 01/25/2016  . Type 2 diabetes mellitus (HCC) 01/03/2016  . Elevated blood pressure 01/03/2016  . Mild intermittent reactive airway disease with wheezing with acute exacerbation 01/03/2016  . Acute cholecystitis 11/12/2011    Medication List:  Allergies as of 01/31/2021      Reactions   Other Other (See Comments)   Catgut stitches causes water blisters      Medication List       Accurate as of January 31, 2021 12:51 PM. If you have any questions, ask your nurse or doctor.        albuterol 108 (90 Base) MCG/ACT inhaler Commonly known as: VENTOLIN HFA Inhale 2 puffs into the lungs every 6 (six) hours as needed for wheezing or shortness of breath.   Breztri Aerosphere  160-9-4.8 MCG/ACT Aero Generic drug: Budeson-Glycopyrrol-Formoterol Inhale 2 puffs into the lungs in the morning and at bedtime. Started by: Alfonse SpruceJoel Louis Terasa Orsini, MD   cetirizine 10 MG tablet Commonly known as: ZYRTEC Take 1 tablet (10 mg total) by mouth daily.   Claritin 10 MG tablet Generic drug: loratadine Take 10 mg by mouth daily.   fexofenadine 180 MG tablet Commonly known as: ALLEGRA Take 1 tablet (180 mg total) by mouth daily. Started by: Alfonse SpruceJoel Louis Izzy [place], MD   fluticasone 50 MCG/ACT nasal spray Commonly known as: FLONASE Place 2 sprays into both nostrils daily. Started by: Alfonse SpruceJoel Louis Ewel Lona, MD   Fluticasone-Salmeterol 250-50 MCG/DOSE Aepb Commonly known as: Wixela Inhub TAKE 1 PUFF BY MOUTH TWICE A DAY   ibuprofen 600 MG tablet Commonly known as: ADVIL Take 1 tablet (600 mg total) by mouth every 6 (six) hours as needed.   losartan 25 MG tablet Commonly known as: COZAAR Take  1 tablet (25 mg total) by mouth daily.   montelukast 10 MG tablet Commonly known as: SINGULAIR TAKE 1 TABLET BY MOUTH EVERYDAY AT BEDTIME   omeprazole 20 MG capsule Commonly known as: PRILOSEC Take 1 capsule (20 mg total) by mouth daily.   pravastatin 20 MG tablet Commonly known as: PRAVACHOL Take 1 tablet (20 mg total) by mouth daily.   venlafaxine XR 75 MG 24 hr capsule Commonly known as: EFFEXOR-XR TAKE ONE CAPSULE BY MOUTH EVERY MORNING WITH BREAKFAST       Birth History: non-contributory  Developmental History: non-contributory  Past Surgical History: Past Surgical History:  Procedure Laterality Date  . APPENDECTOMY  1st grade  . CESAREAN SECTION  2008  . CHOLECYSTECTOMY  11/11/2011   Procedure: LAPAROSCOPIC CHOLECYSTECTOMY WITH INTRAOPERATIVE CHOLANGIOGRAM;  Surgeon: Jetty Duhamel, MD;  Location: [facility] OR;  Service: General;  Laterality: N/A;  . TONSILLECTOMY  6th grade     Family History: Family History  Problem Relation Age of Onset  . Hyperlipidemia  Mother   . Hypertension Father   . Tongue cancer Maternal Grandfather   . Prostate cancer Paternal Uncle   . Kidney cancer Maternal Uncle   . Heart disease Brother   . Hypertension Brother   . Eczema Son   . Allergic rhinitis Neg Hx   . Angioedema Neg Hx   . Urticaria Neg Hx      Social History: Shenita lives at home with her family. The house was built in 2002. There are hard surfaces throughout the home. There is gas and electric heating with central cooling. There are cats and dogs in the home. There are not dust mite coverings on the bedding. There is no tobacco exposure at this time, but she smoked for a couple of years. 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 There is a HEPA filter in the home. She does not live near an interstate or industrial area.    Review of Systems  Constitutional: Negative.  Negative for chills, fever, malaise/fatigue and weight loss.  HENT: Negative.  Negative for congestion, ear discharge, ear pain, sinus pain and sore throat.   Eyes: Negative for pain, discharge and redness.  Respiratory: Negative for cough, sputum production, shortness of breath and wheezing.   Cardiovascular: Negative.  Negative for chest pain and palpitations.  Gastrointestinal: Negative for abdominal pain, constipation, diarrhea, heartburn, nausea and vomiting.  Skin: Negative.  Negative for itching and rash.  Neurological: Negative for dizziness and headaches.  Endo/Heme/Allergies: Negative for environmental allergies. Does not bruise/bleed easily.       Objective:   Blood pressure 134/90, pulse 82, temperature 98.2 F (36.8 C), temperature source Temporal, resp. rate 12, height 5\' 2"  (1.575 m), weight 191 lb 12.8 oz (87 kg), last menstrual period 01/18/2021, SpO2 96 %. Body mass index is 35.08 kg/m.   Physical Exam:   Physical Exam Constitutional:      Appearance: She is well-developed.  HENT:     Head: Normocephalic and  atraumatic.     Right Ear: Tympanic membrane, ear canal and external ear normal. No drainage, swelling or tenderness. Tympanic membrane is not injected, scarred, erythematous, retracted or bulging.     Left Ear: Tympanic membrane, ear canal and external ear normal. No drainage, swelling or tenderness. Tympanic membrane is not injected, scarred, erythematous, retracted or bulging.     Nose: No nasal deformity, septal deviation, mucosal edema or rhinorrhea.     Right Turbinates:  Enlarged and swollen.     Left Turbinates: Enlarged and swollen.     Right Sinus: No maxillary sinus tenderness or frontal sinus tenderness.     Left Sinus: No maxillary sinus tenderness or frontal sinus tenderness.     Comments: No nasal polyps noted.     Mouth/Throat:     Mouth: Mucous membranes are not pale and not dry.     Pharynx: Uvula midline.  Eyes:     General:        Right eye: No discharge.        Left eye: No discharge.     Conjunctiva/sclera: Conjunctivae normal.     Right eye: Right conjunctiva is not injected. No chemosis.    Left eye: Left conjunctiva is not injected. No chemosis.    Pupils: Pupils are equal, round, and reactive to light.  Cardiovascular:     Rate and Rhythm: Normal rate and regular rhythm.     Heart sounds: Normal heart sounds.  Pulmonary:     Effort: Pulmonary effort is normal. No tachypnea, accessory muscle usage or respiratory distress.     Breath sounds: Examination of the right-upper field reveals wheezing. Examination of the left-upper field reveals wheezing. Examination of the right-middle field reveals wheezing. Examination of the left-middle field reveals wheezing. Examination of the right-lower field reveals wheezing. Examination of the left-lower field reveals wheezing. Wheezing present. No rhonchi or rales.     Comments: Audibly wheezing throughout all lung fields. Chest:     Chest wall: No tenderness.  Abdominal:     Tenderness: There is no abdominal tenderness. There  is no guarding or rebound.  Lymphadenopathy:     Head:     Right side of head: No submandibular, tonsillar or occipital adenopathy.     Left side of head: No submandibular, tonsillar or occipital adenopathy.     Cervical: No cervical adenopathy.  Skin:    General: Skin is warm.     Capillary Refill: Capillary refill takes less than 2 seconds.     Coloration: Skin is not pale.     Findings: No abrasion, erythema, petechiae or rash. Rash is not papular, urticarial or vesicular.     Comments: No eczematous or urticarial lesions noted.  Neurological:     Mental Status: She is alert.  Psychiatric:        Behavior: Behavior is cooperative.      Diagnostic studies:    Spirometry: results abnormal (FEV1: 1.29/49%, FVC: 1.90/57%, FEV1/FVC: 68%).    Spirometry consistent with possible restrictive disease. Xopenex four puffs via MDI treatment given in clinic with no improvement.  Allergy Studies: none      Malachi Bonds, MD [facility]

## 2021-02-01 DIAGNOSIS — J310 Chronic rhinitis: Secondary | ICD-10-CM | POA: Diagnosis not present

## 2021-02-01 DIAGNOSIS — B999 Unspecified infectious disease: Secondary | ICD-10-CM | POA: Diagnosis not present

## 2021-02-01 DIAGNOSIS — J4550 Severe persistent asthma, uncomplicated: Secondary | ICD-10-CM | POA: Diagnosis not present

## 2021-02-17 LAB — STREP PNEUMONIAE 23 SEROTYPES IGG
Pneumo Ab Type 1*: 2.3 ug/mL (ref >1.3)
Pneumo Ab Type 12 (12F)*: 0.2 ug/mL — ABNORMAL LOW (ref >1.3)
Pneumo Ab Type 14*: 0.4 ug/mL — ABNORMAL LOW (ref >1.3)
Pneumo Ab Type 17 (17F)*: 0.6 ug/mL — ABNORMAL LOW (ref >1.3)
Pneumo Ab Type 19 (19F)*: 6.9 ug/mL (ref >1.3)
Pneumo Ab Type 2*: <0.1 ug/mL — ABNORMAL LOW (ref >1.3)
Pneumo Ab Type 20*: 0.2 ug/mL — ABNORMAL LOW (ref >1.3)
Pneumo Ab Type 22 (22F)*: 0.5 ug/mL — ABNORMAL LOW (ref >1.3)
Pneumo Ab Type 23 (23F)*: <0.1 ug/mL — ABNORMAL LOW (ref >1.3)
Pneumo Ab Type 26 (6B)*: 0.2 ug/mL — ABNORMAL LOW (ref >1.3)
Pneumo Ab Type 3*: 3 ug/mL (ref >1.3)
Pneumo Ab Type 34 (10A)*: 0.9 ug/mL — ABNORMAL LOW (ref >1.3)
Pneumo Ab Type 4*: 0.1 ug/mL — ABNORMAL LOW (ref >1.3)
Pneumo Ab Type 43 (11A)*: 2.3 ug/mL (ref >1.3)
Pneumo Ab Type 5*: 0.5 ug/mL — ABNORMAL LOW (ref >1.3)
Pneumo Ab Type 51 (7F)*: 0.3 ug/mL — ABNORMAL LOW (ref >1.3)
Pneumo Ab Type 54 (15B)*: 1.1 ug/mL — ABNORMAL LOW (ref >1.3)
Pneumo Ab Type 56 (18C)*: 0.5 ug/mL — ABNORMAL LOW (ref >1.3)
Pneumo Ab Type 57 (19A)*: 5.8 ug/mL (ref >1.3)
Pneumo Ab Type 68 (9V)*: 0.2 ug/mL — ABNORMAL LOW (ref >1.3)
Pneumo Ab Type 70 (33F)*: 1 ug/mL — ABNORMAL LOW (ref >1.3)
Pneumo Ab Type 8*: <0.1 ug/mL — ABNORMAL LOW (ref >1.3)
Pneumo Ab Type 9 (9N)*: <0.1 ug/mL — ABNORMAL LOW (ref >1.3)

## 2021-02-17 LAB — ALLERGENS W/COMP RFLX AREA 2
Alternaria Alternata IgE: <0.1 kU/L
Aspergillus Fumigatus IgE: <0.1 kU/L
Bermuda Grass IgE: <0.1 kU/L
Cedar, Mountain IgE: <0.1 kU/L
Cladosporium Herbarum IgE: <0.1 kU/L
Cockroach, German IgE: <0.1 kU/L
Common Silver Birch IgE: <0.1 kU/L
Cottonwood IgE: <0.1 kU/L
D Farinae IgE: <0.1 kU/L
D Pteronyssinus IgE: <0.1 kU/L
E001-IgE Cat Dander: <0.1 kU/L
E005-IgE Dog Dander: <0.1 kU/L
Elm, American IgE: <0.1 kU/L
IgE (Immunoglobulin E), Serum: 21 IU/mL (ref 6–495)
Johnson Grass IgE: <0.1 kU/L
Maple/Box Elder IgE: <0.1 kU/L
Mouse Urine IgE: <0.1 kU/L
Oak, White IgE: <0.1 kU/L
Pecan, Hickory IgE: <0.1 kU/L
Penicillium Chrysogen IgE: <0.1 kU/L
Pigweed, Rough IgE: <0.1 kU/L
Ragweed, Short IgE: <0.1 kU/L
Sheep Sorrel IgE Qn: <0.1 kU/L
Timothy Grass IgE: <0.1 kU/L
White Mulberry IgE: <0.1 kU/L

## 2021-02-17 LAB — CBC WITH DIFFERENTIAL/PLATELET
Basophils Absolute: 0.1 10*3/uL (ref 0.0–0.2)
Basos: 1 %
EOS (ABSOLUTE): 1.2 10*3/uL — ABNORMAL HIGH (ref 0.0–0.4)
Eos: 14 %
Hematocrit: 44.6 % (ref 34.0–46.6)
Hemoglobin: 14.8 g/dL (ref 11.1–15.9)
Immature Grans (Abs): 0 10*3/uL (ref 0.0–0.1)
Immature Granulocytes: 0 %
Lymphocytes Absolute: 2.9 10*3/uL (ref 0.7–3.1)
Lymphs: 33 %
MCH: 29.9 pg (ref 26.6–33.0)
MCHC: 33.2 g/dL (ref 31.5–35.7)
MCV: 90 fL (ref 79–97)
Monocytes Absolute: 0.7 10*3/uL (ref 0.1–0.9)
Monocytes: 7 %
Neutrophils Absolute: 4 10*3/uL (ref 1.4–7.0)
Neutrophils: 45 %
Platelets: 335 10*3/uL (ref 150–450)
RBC: 4.95 x10E6/uL (ref 3.77–5.28)
RDW: 12.8 % (ref 11.7–15.4)
WBC: 8.8 10*3/uL (ref 3.4–10.8)

## 2021-02-17 LAB — COMPLEMENT, TOTAL: Compl, Total (CH50): >60 U/mL (ref >41)

## 2021-02-17 LAB — ALPHA-1-ANTITRYPSIN: A-1 Antitrypsin: 136 mg/dL (ref 101–187)

## 2021-02-17 LAB — ASPERGILLUS PRECIPITINS
A.Fumigatus #1 Abs: NEGATIVE
Aspergillus Flavus Antibodies: NEGATIVE
Aspergillus Niger Antibodies: NEGATIVE
Aspergillus glaucus IgG: NEGATIVE
Aspergillus nidulans IgG: NEGATIVE
Aspergillus terreus IgG: NEGATIVE

## 2021-02-17 LAB — ANCA TITERS
Atypical pANCA: <1:20 {titer}
C-ANCA: <1:20 {titer}
P-ANCA: <1:20 {titer}

## 2021-02-17 LAB — DIPHTHERIA / TETANUS ANTIBODY PANEL
Diphtheria Ab: <0.1 IU/mL — ABNORMAL LOW (ref <0.10)
Tetanus Ab, IgG: 0.6 IU/mL (ref <0.10)

## 2021-02-17 LAB — IGG, IGA, IGM
IgA/Immunoglobulin A, Serum: 159 mg/dL (ref 87–352)
IgG (Immunoglobin G), Serum: 927 mg/dL (ref 586–1602)
IgM (Immunoglobulin M), Srm: 55 mg/dL (ref 26–217)

## 2021-02-24 DIAGNOSIS — L650 Telogen effluvium: Secondary | ICD-10-CM | POA: Diagnosis not present

## 2021-03-02 DIAGNOSIS — J4550 Severe persistent asthma, uncomplicated: Secondary | ICD-10-CM | POA: Diagnosis not present

## 2021-03-02 DIAGNOSIS — J310 Chronic rhinitis: Secondary | ICD-10-CM | POA: Diagnosis not present

## 2021-03-02 DIAGNOSIS — B999 Unspecified infectious disease: Secondary | ICD-10-CM | POA: Diagnosis not present

## 2021-03-03 ENCOUNTER — Encounter: Payer: Self-pay | Admitting: Allergy & Immunology

## 2021-03-03 ENCOUNTER — Other Ambulatory Visit: Payer: Self-pay

## 2021-03-03 ENCOUNTER — Ambulatory Visit: Payer: BC Managed Care – PPO | Admitting: Allergy & Immunology

## 2021-03-03 VITALS — BP 110/90 | HR 82 | Temp 98.0°F | Resp 20 | Ht 62.0 in | Wt 191.4 lb

## 2021-03-03 DIAGNOSIS — J310 Chronic rhinitis: Secondary | ICD-10-CM | POA: Diagnosis not present

## 2021-03-03 DIAGNOSIS — J4550 Severe persistent asthma, uncomplicated: Secondary | ICD-10-CM | POA: Diagnosis not present

## 2021-03-03 DIAGNOSIS — B999 Unspecified infectious disease: Secondary | ICD-10-CM

## 2021-03-03 MED ORDER — BENRALIZUMAB 30 MG/ML ~~LOC~~ SOSY
30.0000 mg | PREFILLED_SYRINGE | SUBCUTANEOUS | Status: AC
  Administered 2021-03-03: 30 mg via SUBCUTANEOUS

## 2021-03-03 NOTE — Patient Instructions (Addendum)
1. Severe persistent asthma, uncomplicated - Lung testing looked better but not completely normal. - We gave a sample of Fasenra to get you started on treatment. Audry Riles training and anaphylaxis management plan provided. - Start the prednisone pack provided.  - Daily controller medication(s): Breztri 2 puffs twice daily with a spacer - Prior to physical activity: albuterol 2 puffs 10-15 minutes before physical activity. - Rescue medications: albuterol 4 puffs every 4-6 hours as needed - Asthma control goals:  * Full participation in all desired activities (may need albuterol before activity) * Albuterol use two time or less a week on average (not counting use with activity) * Cough interfering with sleep two time or less a month * Oral steroids no more than once a year * No hospitalizations  2. Chronic rhinitis - We will do testing once your breathing is under better control. - Blood work was negative. - Continue with Allegra 180 mg 1 tablet once daily (this 1 works better than Claritin and still has less sleepiness than Zyrtec).  - Continue with Flonase 2 sprays per nostril once daily.  3. Recurrent infection - You need to get both a Tdap and a Pneumovax since your titers were low. - Once you get those, we can re-test your immunization titers 4-6 weeks after.  - You can get a Pneumovax here, but we do not carry Tdap, so it might be better to get the vaccine with Dr. [place].   4. Return in about 2 months (around 05/03/2021) for SKIN TESTING ([place] or [place] office).    Please inform us of any Emergency Department visits, hospitalizations, or changes in symptoms. Call us before going to the ED for breathing or allergy symptoms since we might be able to fit you in for a sick visit. Feel free to contact us anytime with any questions, problems, or concerns.  It was a pleasure to see you again today!  Websites that have reliable patient information: 1. American Academy of Asthma,  Allergy, and Immunology: www.aaaai.org 2. Food Allergy Research and Education (FARE): foodallergy.org 3. Mothers of Asthmatics: http://www.asthmacommunitynetwork.org 4. American College of Allergy, Asthma, and Immunology: www.acaai.org   COVID-19 Vaccine Information can be found at: [email or web address].[email or web address] For questions related to vaccine distribution or appointments, please email [email or web address] or call [phone].   We realize that you might be concerned about having an allergic reaction to the COVID19 vaccines. To help with that concern, WE ARE OFFERING THE COVID19 VACCINES IN OUR OFFICE! Ask the front desk for dates!     "Like" [place] on Facebook and Instagram for our latest updates!      A healthy democracy works best when [employer] participate! Make sure you are registered to vote! If you have moved or changed any of your contact information, you will need to get this updated before voting!  In some cases, you MAY be able to register to vote online: [email or web address]

## 2021-03-03 NOTE — Progress Notes (Signed)
FOLLOW UP  Date of Service/Encounter:  03/03/21   Assessment:   Severe persistent asthma, uncomplicated - with eosinophilic phenotype (AEC of 1200) on Fasenra   Chronic rhinitis  Recurrent infections   Plan/Recommendations:   1. Severe persistent asthma, uncomplicated - Lung testing looked better but not completely normal. - We gave a sample of Fasenra to get you started on treatment. Audry Riles training and anaphylaxis management plan provided. - Start the prednisone pack provided.  - Daily controller medication(s): Breztri 2 puffs twice daily with a spacer - Prior to physical activity: albuterol 2 puffs 10-15 minutes before physical activity. - Rescue medications: albuterol 4 puffs every 4-6 hours as needed - Asthma control goals:  * Full participation in all desired activities (may need albuterol before activity) * Albuterol use two time or less a week on average (not counting use with activity) * Cough interfering with sleep two time or less a month * Oral steroids no more than once a year * No hospitalizations  2. Chronic rhinitis - We will do testing once your breathing is under better control. - Blood work was negative. - Continue with Allegra 180 mg 1 tablet once daily (this 1 works better than Claritin and still has less sleepiness than Zyrtec).  - Continue with Flonase 2 sprays per nostril once daily.  3. Recurrent infection - You need to get both a Tdap and a Pneumovax since your titers were low. - Once you get those, we can re-test your immunization titers 4-6 weeks after.  - You can get a Pneumovax here, but we do not carry Tdap, so it might be better to get the vaccine with Dr. [place].   4. Return in about 2 months (around 05/03/2021) for SKIN TESTING ([place] or [place] office).   Subjective:   Terri Richardson is a 50 y.o. female presenting today for follow up of  Chief Complaint  Patient presents with  . Follow-up    Pt states no  improvement.  . Wheezing    Terri Richardson has a history of the following: Patient Active Problem List   Diagnosis Date Noted  . Hyperlipidemia associated with type 2 diabetes mellitus (HCC) 03/28/2019  . Recurrent major depression in remission (HCC) 05/24/2018  . Anxiety disorder 09/07/2017  . Morbid obesity (HCC) 03/14/2016  . Asthmatic bronchitis , chronic (HCC) 01/26/2016  . Dyspnea 01/25/2016  . Essential hypertension 01/25/2016  . Type 2 diabetes mellitus (HCC) 01/03/2016  . Elevated blood pressure 01/03/2016  . Mild intermittent reactive airway disease with wheezing with acute exacerbation 01/03/2016  . Acute cholecystitis 11/12/2011    History obtained from: chart review and patient.  Terri Richardson is a 50 y.o. female presenting for a follow up visit.  He was last seen in April 2022 as a new patient.  At that time, her lung testing looked terrible and did not change much after albuterol treatment.  We did not do skin testing because of this.  We stopped her Advair and started Breztri 2 puffs twice daily with a spacer.  We obtain lab work to look for serious causes of difficult to control asthma.  We obtained an environmental allergy panel via the blood.  We stopped her Claritin and started Allegra as well as Flonase.  We were going to try Te[place]sp[place] for her biologic, but her insurance required failure of other Biologics first.  We started her on Fasenra.  Absolute eosinophil count was 1200.  Alpha-1 antitrypsin was normal.  Aspergillus precipitants were  negative. ANCA panel was negative.   We did an immune work-up and she was not protective to diphtheria. She was protective to only 5 out of 23 types of Streptococcus pneumonia.  Her immunoglobulin levels were all normal.  Since last visit, she has mostly done well. She is unsure if she is doing better or not.   Asthma/Respiratory Symptom History: The last time that she used her rescue inhaler was last week. She has not been to the ED or  Urgent Care for her symptoms. While she tells me she is not doing any better, that is not using her albuterol as often as she wants.  She will sometimes go days without using it.  She has not needed prednisone and has not been to the emergency room.  She reports sleeping fairly well at night.  She is using the spacer and feels fairly good with that.  Her Harrington Challenger has been approved but it has not been delivered yet.  She has not been trained on how to administer it.  She does not have an anaphylaxis management plan in place.  Allergic Rhinitis Symptom History: She has allergic rhinitis symptoms that are worse in the pollen season. Her asthma is always worse during this time of the year as well. Her blood testing was negative, but she is interested in skin testing.  She is on her Allegra and her Flonase.  She actually lives in [place] and the [place] or [place] office would be a lot closer for her.  She works at [employer], the location on [address].  She had  breathing problems before this and her breathing problems have not seemed to have worsened with her new working environment. She works in Clinical biochemist.   She has not received any additional antibiotics since the last visit. She cannot remember the last time that she got a Tdap and she definitely doesn ot think that she has gotten a Pneumovax.   Otherwise, there have been no changes to her past medical history, surgical history, family history, or social history.    Review of Systems  Constitutional: Negative.  Negative for chills, fever, malaise/fatigue and weight loss.  HENT: Negative.  Negative for congestion, ear discharge, ear pain and sore throat.   Eyes: Negative for pain, discharge and redness.  Respiratory: Positive for cough and shortness of breath. Negative for sputum production and wheezing.   Cardiovascular: Negative.  Negative for chest pain and palpitations.  Gastrointestinal: Negative for abdominal  pain, diarrhea, heartburn, nausea and vomiting.  Skin: Negative.  Negative for itching and rash.  Neurological: Negative for dizziness and headaches.  Endo/Heme/Allergies: Negative for environmental allergies. Does not bruise/bleed easily.       Objective:   Blood pressure 110/90, pulse 82, temperature 98 F (36.7 C), temperature source Temporal, resp. rate 20, height 5\' 2"  (1.575 m), weight 191 lb 6.4 oz (86.8 kg), SpO2 96 %. Body mass index is 35.01 kg/m.   Physical Exam:  Physical Exam Constitutional:      Appearance: She is well-developed.     Comments: Very pleasant. Cooperative with the exam. Pleasant.   HENT:     Head: Normocephalic and atraumatic.     Right Ear: Tympanic membrane, ear canal and external ear normal.     Left Ear: Tympanic membrane, ear canal and external ear normal.     Nose: No nasal deformity, septal deviation, mucosal edema or rhinorrhea.     Right Turbinates: Enlarged and swollen.  Left Turbinates: Enlarged and swollen.     Right Sinus: No maxillary sinus tenderness or frontal sinus tenderness.     Left Sinus: No maxillary sinus tenderness or frontal sinus tenderness.     Comments: No nasal polyps.     Mouth/Throat:     Mouth: Mucous membranes are not pale and not dry.     Pharynx: Uvula midline.  Eyes:     General:        Right eye: No discharge.        Left eye: No discharge.     Conjunctiva/sclera: Conjunctivae normal.     Right eye: Right conjunctiva is not injected. No chemosis.    Left eye: Left conjunctiva is not injected. No chemosis.    Pupils: Pupils are equal, round, and reactive to light.  Cardiovascular:     Rate and Rhythm: Normal rate and regular rhythm.     Heart sounds: Normal heart sounds.  Pulmonary:     Effort: Pulmonary effort is normal. No tachypnea, accessory muscle usage or respiratory distress.     Breath sounds: Normal breath sounds. No wheezing, rhonchi or rales.     Comments: Scattered expiratory wheezing but  it does not sound any worse than her last visit. She seems to be moving air better than last time. Chest:     Chest wall: No tenderness.  Lymphadenopathy:     Cervical: No cervical adenopathy.  Skin:    Coloration: Skin is not pale.     Findings: No abrasion, erythema, petechiae or rash. Rash is not papular, urticarial or vesicular.  Neurological:     Mental Status: She is alert.      Diagnostic studies:    Spirometry: results normal (FEV1: 1.22/46%, FVC: 2.14/66%, FEV1/FVC: 57%).    Spirometry consistent with mixed obstructive and restrictive disease.   Allergy Studies: none        Malachi Bonds, MD  [facility]

## 2021-03-31 ENCOUNTER — Telehealth: Payer: Self-pay | Admitting: Allergy & Immunology

## 2021-03-31 NOTE — Telephone Encounter (Signed)
Patient called to let me know that her Harrington Challenger that she received at home was defective.  She was unable to inject it.  She talked to the manufacture and they are sending her a replacement line.  He just wanted to let us know that her injection would be a little bit late.  I called her back to let her know that if she has any breakthrough asthma symptoms, she can let us know and we can do with it at that time.  Malachi Bonds, MD [facility]

## 2021-05-04 ENCOUNTER — Ambulatory Visit: Payer: BC Managed Care – PPO | Admitting: Allergy & Immunology

## 2021-05-09 ENCOUNTER — Other Ambulatory Visit: Payer: Self-pay | Admitting: Allergy & Immunology

## 2021-06-29 ENCOUNTER — Other Ambulatory Visit: Payer: Self-pay

## 2021-06-29 ENCOUNTER — Encounter: Payer: Self-pay | Admitting: Allergy & Immunology

## 2021-06-29 ENCOUNTER — Ambulatory Visit (INDEPENDENT_AMBULATORY_CARE_PROVIDER_SITE_OTHER): Payer: BC Managed Care – PPO | Admitting: Allergy & Immunology

## 2021-06-29 VITALS — BP 112/88 | HR 72 | Temp 98.1°F | Resp 18 | Ht 62.0 in | Wt 206.4 lb

## 2021-06-29 DIAGNOSIS — B999 Unspecified infectious disease: Secondary | ICD-10-CM | POA: Insufficient documentation

## 2021-06-29 DIAGNOSIS — J302 Other seasonal allergic rhinitis: Secondary | ICD-10-CM

## 2021-06-29 DIAGNOSIS — J4550 Severe persistent asthma, uncomplicated: Secondary | ICD-10-CM | POA: Diagnosis not present

## 2021-06-29 DIAGNOSIS — J3089 Other allergic rhinitis: Secondary | ICD-10-CM | POA: Diagnosis not present

## 2021-06-29 MED ORDER — BREZTRI AEROSPHERE 160-9-4.8 MCG/ACT IN AERO: 2.0000 | INHALATION_SPRAY | Freq: Two times a day (BID) | RESPIRATORY_TRACT | 5 refills | Status: DC

## 2021-06-29 MED ORDER — LORATADINE 10 MG PO TABS: 10.0000 mg | ORAL_TABLET | Freq: Every day | ORAL | 5 refills | Status: DC

## 2021-06-29 MED ORDER — FLUTICASONE PROPIONATE 50 MCG/ACT NA SUSP: 2.0000 | Freq: Every day | NASAL | 5 refills | Status: DC

## 2021-06-29 NOTE — Patient Instructions (Addendum)
1. Severe persistent asthma, uncomplicated - Lung testing looked much better today.  - Continue with Harrington Challenger, now spaced to  - Daily controller medication(s): Breztri 2 puffs twice daily with a spacer - Prior to physical activity: albuterol 2 puffs 10-15 minutes before physical activity. - Rescue medications: albuterol 4 puffs every 4-6 hours as needed - Asthma control goals:  * Full participation in all desired activities (may need albuterol before activity) * Albuterol use two time or less a week on average (not counting use with activity) * Cough interfering with sleep two time or less a month * Oral steroids no more than once a year * No hospitalizations  2. Chronic rhinitis - Testing today showed:  mixed feather, trees, outdoor molds, cat, and cockroach - Copy of test results provided.  - Avoidance measures provided. - Continue with: Claritin (loratadine) 10mg  tablet once daily and Flonase (fluticasone) one spray per nostril daily - We could consider allergy shots as a means of long-term control, but you seem to have everything under fairly good control yourself.  - Allergy shots "re-train" and "reset" the immune system to ignore environmental allergens and decrease the resulting immune response to those allergens (sneezing, itchy watery eyes, runny nose, nasal congestion, etc).    - Allergy shots improve symptoms in 75-85% of patients.  - We can discuss more at the next appointment if the medications are not working for you.  3. Recurrent infection - You need to get both a Tdap and a Pneumovax since your titers were low. - Once you get those, we can re-test your immunization titers 4-6 weeks after.  - Be sure to get the repeat Streptococcal pneumonia titers and the Tetanus titers.   4. Return in about 6 months (around 12/27/2021).    Please inform 02/26/2022 of any Emergency Department visits, hospitalizations, or changes in symptoms. Call us before going to the ED for breathing or allergy  symptoms since we might be able to fit you in for a sick visit. Feel free to contact us anytime with any questions, problems, or concerns.  It was a pleasure to see you again today!  Websites that have reliable patient information: 1. American Academy of Asthma, Allergy, and Immunology: www.aaaai.org 2. Food Allergy Research and Education (FARE): foodallergy.org 3. Mothers of Asthmatics: http://www.asthmacommunitynetwork.org 4. American College of Allergy, Asthma, and Immunology: www.acaai.org   COVID-19 Vaccine Information can be found at: [place] For questions related to vaccine distribution or appointments, please email [email or web address] or call [phone].   We realize that you might be concerned about having an allergic reaction to the COVID19 vaccines. To help with that concern, WE ARE OFFERING THE COVID19 VACCINES IN OUR OFFICE! Ask the front desk for dates!     "Like" [phone] on Facebook and Instagram for our latest updates!      A healthy democracy works best when [place] participate! Make sure you are registered to vote! If you have moved or changed any of your contact information, you will need to get this updated before voting!  In some cases, you MAY be able to register to vote online: [employer]

## 2021-06-29 NOTE — Progress Notes (Signed)
FOLLOW UP  Date of Service/Encounter:  06/29/21   Assessment:   Severe persistent asthma, uncomplicated - with eosinophilic phenotype (AEC of 1200) on Fasenra    Perennial and seasonal allergic rhinitis (mixed feather, trees, outdoor molds, cat, and cockroach)   Recurrent infections - with pending workup  Plan/Recommendations:   1. Severe persistent asthma, uncomplicated - Lung testing looked much better today.  - Continue with Harrington ChallengerFasenra, now spaced to  - Daily controller medication(s): Breztri 2 puffs twice daily with a spacer - Prior to physical activity: albuterol 2 puffs 10-15 minutes before physical activity. - Rescue medications: albuterol 4 puffs every 4-6 hours as needed - Asthma control goals:  * Full participation in all desired activities (may need albuterol before activity) * Albuterol use two time or less a week on average (not counting use with activity) * Cough interfering with sleep two time or less a month * Oral steroids no more than once a year * No hospitalizations  2. Chronic rhinitis - Testing today showed:  mixed feather, trees, outdoor molds, cat, and cockroach - Copy of test results provided.  - Avoidance measures provided. - Continue with: Claritin (loratadine) 10mg  tablet once daily and Flonase (fluticasone) one spray per nostril daily - We could consider allergy shots as a means of long-term control, but you seem to have everything under fairly good control yourself.  - Allergy shots "re-train" and "reset" the immune system to ignore environmental allergens and decrease the resulting immune response to those allergens (sneezing, itchy watery eyes, runny nose, nasal congestion, etc).    - Allergy shots improve symptoms in 75-85% of patients.  - We can discuss more at the next appointment if the medications are not working for you.  3. Recurrent infection - You need to get both a Tdap and a Pneumovax since your titers were low. - Once you get those,  we can re-test your immunization titers 4-6 weeks after.  - Be sure to get the repeat Streptococcal pneumonia titers and the Tetanus titers.   4. Return in about 6 months (around 12/27/2021).    Subjective:   Terri Richardson is a 50 y.o. female presenting today for follow up of  Chief Complaint  Patient presents with   Asthma    States it has gotten better with Harrington ChallengerFasenra.   Allergy Testing    Wants to see see what she is allergic to.    Terri Richardson has a history of the following: Patient Active Problem List   Diagnosis Date Noted   Severe persistent asthma without complication 06/29/2021   Recurrent infections 06/29/2021   Seasonal and perennial allergic rhinitis 06/29/2021   Hyperlipidemia associated with type 2 diabetes mellitus (HCC) 03/28/2019   Recurrent major depression in remission (HCC) 05/24/2018   Anxiety disorder 09/07/2017   Morbid obesity (HCC) 03/14/2016   Asthmatic bronchitis , chronic (HCC) 01/26/2016   Dyspnea 01/25/2016   Essential hypertension 01/25/2016   Type 2 diabetes mellitus (HCC) 01/03/2016   Elevated blood pressure 01/03/2016   Mild intermittent reactive airway disease with wheezing with acute exacerbation 01/03/2016   Acute cholecystitis 11/12/2011    History obtained from: chart review and patient.  Terri Richardson is a 50 y.o. female presenting for skin testing.  She was last seen in May 2022.  At that time, we gave her a sample of Harrington ChallengerFasenra due to her history of eosinophilia.  We provided her with Auvi-Q.  We also continued with Breztri 2 puffs twice daily as well  as albuterol as needed.  We decided to do some skin testing to look for environmental allergies.  Her blood work had been negative.  We continued with Allegra 1 tablet daily.  For her history of recurrent infections, we recommended getting a Tdap and Pneumovax to complete her work-up.  Since last visit, she has done well.  Asthma/Respiratory Symptom History: She remains on her Breztri 2 puffs  twice daily .  She feels that the Harrington Challenger is doing wonders to help with her asthma.  She has not used her albuterol inhaler since starting the Fasenra.  She is getting the injections at home.  He is now spacing out to every 8 weeks after this most recent injection.  She is having more problems with giving it to her self.  She sometimes has her husband help her.  She denies any nighttime coughing.  Her Markus Daft is affordable and is covered at $0 right now.  Allergic Rhinitis Symptom History: He is on Claritin every day.  She takes it throughout the year.  She has been on other antihistamines in the past.  However, she is exquisitely susceptible to the sedative side effects of antihistamines.  We tried using Allegra, but she was still tired and/or it did not work as well.  Therefore she went back to Claritin.  She has not had any sinus infections.  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  She does not work outside very often.  Otherwise, there have been no changes to her past medical history, surgical history, family history, or social history.    Review of Systems  Constitutional: Negative.  Negative for chills, fever, malaise/fatigue and weight loss.  HENT: Negative.  Negative for congestion, ear discharge, ear pain and sore throat.   Eyes:  Negative for pain, discharge and redness.  Respiratory:  Negative for cough, sputum production, shortness of breath and wheezing.   Cardiovascular: Negative.  Negative for chest pain and palpitations.  Gastrointestinal:  Negative for abdominal pain, constipation, diarrhea, heartburn, nausea and vomiting.  Skin: Negative.  Negative for itching and rash.  Neurological:  Negative for dizziness and headaches.  Endo/Heme/Allergies:  Negative for environmental allergies. Does not bruise/bleed easily.      Objective:   Blood pressure 112/88, pulse 72, temperature 98.1 F (36.7 C), temperature source Temporal, resp. rate 18, height 5\' 2"  (1.575  m), weight 206 lb 6.4 oz (93.6 kg), SpO2 98 %. Body mass index is 37.75 kg/m.   Physical Exam:  Physical Exam Vitals reviewed.  Constitutional:      Appearance: She is well-developed.     Comments: Very pleasant.  HENT:     Head: Normocephalic and atraumatic.     Right Ear: Tympanic membrane, ear canal and external ear normal.     Left Ear: Tympanic membrane, ear canal and external ear normal.     Nose: No nasal deformity, septal deviation, mucosal edema or rhinorrhea.     Right Turbinates: Enlarged and swollen.     Left Turbinates: Enlarged and swollen.     Right Sinus: No maxillary sinus tenderness or frontal sinus tenderness.     Left Sinus: No maxillary sinus tenderness or frontal sinus tenderness.     Comments: No nasal polyps.    Mouth/Throat:     Mouth: Mucous membranes are not pale and not dry.     Pharynx: Uvula midline.  Eyes:     General: Lids are normal. No allergic shiner.  Right eye: No discharge.        Left eye: No discharge.     Conjunctiva/sclera: Conjunctivae normal.     Right eye: Right conjunctiva is not injected. No chemosis.    Left eye: Left conjunctiva is not injected. No chemosis.    Pupils: Pupils are equal, round, and reactive to light.  Cardiovascular:     Rate and Rhythm: Normal rate and regular rhythm.     Heart sounds: Normal heart sounds.  Pulmonary:     Effort: Pulmonary effort is normal. No tachypnea, accessory muscle usage or respiratory distress.     Breath sounds: Normal breath sounds. No wheezing, rhonchi or rales.  Chest:     Chest wall: No tenderness.  Lymphadenopathy:     Cervical: No cervical adenopathy.  Skin:    Coloration: Skin is not pale.     Findings: No abrasion, erythema, petechiae or rash. Rash is not papular, urticarial or vesicular.  Neurological:     Mental Status: She is alert.  Psychiatric:        Behavior: Behavior is cooperative.     Diagnostic studies:   Spirometry: results normal (FEV1: 2.32/89%,  FVC: 2.80/86%, FEV1/FVC: 83%).    Spirometry consistent with normal pattern.    Allergy Studies:     Airborne Adult Perc - 06/29/21 0933     Time Antigen Placed 0933    Allergen Manufacturer Waynette Buttery    Location Back    Number of Test 59    1. Control-Buffer 50% Glycerol Negative    2. Control-Histamine 1 mg/ml Negative    3. Albumin saline Negative    4. Bahia Negative    5. French Southern Territories Negative    6. Johnson Negative    7. Kentucky Blue Negative    8. Meadow Fescue Negative    9. Perennial Rye Negative    10. Sweet Vernal Negative    11. Timothy Negative    12. Cocklebur Negative    13. Burweed Marshelder Negative    14. Ragweed, short Negative    15. Ragweed, Giant Negative    16. Plantain,  English Negative    17. Lamb's Quarters Negative    18. Sheep Sorrell Negative    19. Rough Pigweed Negative    20. Marsh Elder, Rough Negative    21. Mugwort, Common Negative    22. Ash mix Negative    23. Birch mix Negative    24. Beech American Negative    25. Box, Elder Negative    26. Cedar, red Negative    27. Cottonwood, Guinea-Bissau Negative    28. Elm mix Negative    29. Hickory 2+    30. Maple mix Negative    31. Oak, Guinea-Bissau mix Negative    32. Pecan Pollen Negative    33. Pine mix Negative    34. Sycamore Eastern Negative    35. Walnut, Black Pollen Negative    36. Alternaria alternata Negative    37. Cladosporium Herbarum Negative    38. Aspergillus mix Negative    39. Penicillium mix Negative    40. Bipolaris sorokiniana (Helminthosporium) Negative    41. Drechslera spicifera (Curvularia) 2+    42. Mucor plumbeus Negative    43. Fusarium moniliforme Negative    44. Aureobasidium pullulans (pullulara) Negative    45. Rhizopus oryzae Negative    46. Botrytis cinera Negative    47. Epicoccum nigrum Negative    48. Phoma betae Negative    49. Candida Albicans Negative  50. Trichophyton mentagrophytes Negative    51. Mite, D Farinae  5,000 AU/ml Negative    52.  Mite, D Pteronyssinus  5,000 AU/ml Negative    53. Cat Hair 10,000 BAU/ml Negative    54.  Dog Epithelia 2+    55. Mixed Feathers 2+    56. Horse Epithelia Negative    57. Cockroach, German Negative    58. Mouse Negative    59. Tobacco Leaf Negative             Intradermal - 06/29/21 1008     Time Antigen Placed 1008    Allergen Manufacturer Waynette Buttery    Location Arm    Control Negative    French Southern Territories Negative    Johnson Negative    7 Grass Negative    Ragweed mix Negative    Weed mix Negative    Mold 1 Negative    Mold 2 Negative    Mold 3 Omitted    Mold 4 Negative    Cat Negative    Cockroach 3+    Mite mix Negative             Allergy testing results were read and interpreted by myself, documented by clinical staff.      Malachi Bonds, MD  [facility]

## 2021-07-28 NOTE — Progress Notes (Deleted)
HPI: [name] is a 50 y.o. female, who is here today for her routine physical.  Last CPE: 03/15/20  Regular exercise 3 or more time per week: *** Following a healthy diet: *** She lives with her husband and 5 yo son.  Chronic medical problems: DM II,asthma,depression,seasonal allergies, nephrolithiasis,and HTN among some. Since her last visit she has established with immunologist. Follows with urologist regularly.  Pap smear:*** She has a gyn, Dr Seymour Bars.   There is no immunization history on file for this patient.  Mammogram: *** Colonoscopy: *** DEXA: ***  Hep C screening: Never.  She has *** concerns today. Lab Results  Component Value Date   HGBA1C 6.0 01/25/2021   Lab Results  Component Value Date   CHOL 133 08/04/2019   HDL 59.60 08/04/2019   LDLCALC 53 08/04/2019   TRIG 100.0 08/04/2019   CHOLHDL 2 08/04/2019   Lab Results  Component Value Date   CREATININE 1.18 01/25/2021   BUN 18 01/25/2021   NA 140 01/25/2021   K 4.1 01/25/2021   CL 106 01/25/2021   CO2 22 01/25/2021    Review of Systems  Current Outpatient Medications on File Prior to Visit  Medication Sig Dispense Refill   albuterol (VENTOLIN HFA) 108 (90 Base) MCG/ACT inhaler Inhale 2 puffs into the lungs every 6 (six) hours as needed for wheezing or shortness of breath. 18 g 3   Budeson-Glycopyrrol-Formoterol (BREZTRI AEROSPHERE) 160-9-4.8 MCG/ACT AERO Inhale 2 puffs into the lungs in the morning and at bedtime. 10.7 g 5   FASENRA PEN 30 MG/ML SOAJ INJECT 1 PEN UNDER THE SKIN AT WEEK 0, 4 AND 8, THEN INJECT 1 PEN EVERY 8 WEEKS THEREAFTER. 1 mL 7   fluticasone (FLONASE) 50 MCG/ACT nasal spray Place 2 sprays into both nostrils daily. 16 g 5   ibuprofen (ADVIL,MOTRIN) 600 MG tablet Take 1 tablet (600 mg total) by mouth every 6 (six) hours as needed. 30 tablet 0   loratadine (CLARITIN) 10 MG tablet Take 1 tablet (10 mg total) by mouth daily. 30 tablet 5   losartan (COZAAR) 25 MG  tablet Take 1 tablet (25 mg total) by mouth daily. 90 tablet 2   montelukast (SINGULAIR) 10 MG tablet TAKE 1 TABLET BY MOUTH EVERYDAY AT BEDTIME 90 tablet 3   omeprazole (PRILOSEC) 20 MG capsule Take 1 capsule (20 mg total) by mouth daily. 90 capsule 3   pravastatin (PRAVACHOL) 20 MG tablet Take 1 tablet (20 mg total) by mouth daily. 90 tablet 2   Current Facility-Administered Medications on File Prior to Visit  Medication Dose Route Frequency Provider Last Rate Last Admin   Benralizumab SOSY 30 mg  30 mg Subcutaneous Q28 days Alfonse Spruce, MD   30 mg at 03/03/21 1532     Past Medical History:  Diagnosis Date   Allergy    Asthma    Depression    Diabetes mellitus 2006   GERD (gastroesophageal reflux disease)    Hypertension     Past Surgical History:  Procedure Laterality Date   APPENDECTOMY  1st grade   CESAREAN SECTION  2008   CHOLECYSTECTOMY  11/11/2011   Procedure: LAPAROSCOPIC CHOLECYSTECTOMY WITH INTRAOPERATIVE CHOLANGIOGRAM;  Surgeon: Jetty Duhamel, MD;  Location: [facility] OR;  Service: General;  Laterality: N/A;   TONSILLECTOMY  6th grade    Allergies  Allergen Reactions   Other Other (See Comments)    Catgut stitches causes water blisters    Family History  Problem  Relation Age of Onset   Hyperlipidemia Mother    Hypertension Father    Tongue cancer Maternal Grandfather    Prostate cancer Paternal Uncle    Kidney cancer Maternal Uncle    Heart disease Brother    Hypertension Brother    Eczema Son    Allergic rhinitis Neg Hx    Angioedema Neg Hx    Urticaria Neg Hx     Social History   Socioeconomic History   Marital status: Married    Spouse name: Not on file   Number of children: Not on file   Years of education: Not on file   Highest education level: Not on file  Occupational History   Occupation: Works at [employer]  Tobacco Use   Smoking status: Former    Packs/day: 0.50    Years: 4.00    Pack years: 2.00    Types: Cigarettes     Quit date: 02/20/2014    Years since quitting: 7.4   Smokeless tobacco: Never  Substance and Sexual Activity   Alcohol use: No    Alcohol/week: 0.0 standard drinks   Drug use: No   Sexual activity: Yes    Birth control/protection: None  Other Topics Concern   Not on file  Social History Narrative   Not on file   Social Determinants of Health   Financial Resource Strain: Not on file  Food Insecurity: Not on file  Transportation Needs: Not on file  Physical Activity: Not on file  Stress: Not on file  Social Connections: Not on file     There were no vitals filed for this visit. There is no height or weight on file to calculate BMI.   Wt Readings from Last 3 Encounters:  06/29/21 206 lb 6.4 oz (93.6 kg)  03/03/21 191 lb 6.4 oz (86.8 kg)  01/31/21 191 lb 12.8 oz (87 kg)     Physical Exam    ASSESSMENT AND PLAN:  [name] was here today annual physical examination.     No orders of the defined types were placed in this encounter.   There are no diagnoses linked to this encounter.  No problem-specific Assessment & Plan notes found for this encounter.  No follow-ups on file.   Terri Heavner G. [place], MD  [facility]. [place] office.

## 2021-07-29 ENCOUNTER — Other Ambulatory Visit: Payer: Self-pay

## 2021-07-29 ENCOUNTER — Encounter: Payer: BC Managed Care – PPO | Admitting: Family Medicine

## 2021-07-29 DIAGNOSIS — E1169 Type 2 diabetes mellitus with other specified complication: Secondary | ICD-10-CM

## 2021-07-29 DIAGNOSIS — Z1159 Encounter for screening for other viral diseases: Secondary | ICD-10-CM

## 2021-10-28 ENCOUNTER — Telehealth: Payer: Self-pay | Admitting: Family Medicine

## 2021-10-28 ENCOUNTER — Other Ambulatory Visit: Payer: Self-pay | Admitting: Family Medicine

## 2021-10-28 DIAGNOSIS — I10 Essential (primary) hypertension: Secondary | ICD-10-CM

## 2021-10-28 DIAGNOSIS — E1169 Type 2 diabetes mellitus with other specified complication: Secondary | ICD-10-CM

## 2021-10-28 NOTE — Telephone Encounter (Signed)
Patient calling in with respiratory symptoms: Shortness of breath, chest pain, palpitations or other red words send to Triage  Does the patient have a fever over 100, cough, congestion, sore throat, runny nose, lost of taste/smell (please list symptoms that patient has)?lingering cough, slight sniffle  What date did symptoms start? Stated that it started as small cough in late December, could not put exact date on when it started. I spoke with Judson Roch and she stated she would be over the time limit anyway, so she is good to come in  (If over 5 days ago, pt may be scheduled for in person visit)  Have you tested for Covid in the last 5 days? No   If yes, was it positive OR negative ? If positive in the last 5 days, please schedule virtual visit now. If negative, schedule for an in person OV with the next available provider if PCP has no openings. Please also let patient know they will be tested again (follow the script below)  "you will have to arrive 22mins prior to your appt time to be Covid tested. Please park in back of office at the cone & call [phone] to let the staff know you have arrived. A staff member will meet you at your car to do a rapid covid test. Once the test has resulted you will be notified by phone of your results to determine if appt will remain an in person visit or be converted to a virtual/phone visit. If you arrive less than 71mins before your appt time, your visit will be automatically converted to virtual & any recommended testing will happen AFTER the visit."   [place]  If no availability for virtual visit in office,  please schedule another [place] office  If no availability at another [place] office, please instruct patient that they can schedule an evisit or virtual visit through their mychart account. Visits up to 8pm  patients can be seen in office 5 days after positive COVID test

## 2021-10-30 ENCOUNTER — Other Ambulatory Visit: Payer: Self-pay | Admitting: Allergy & Immunology

## 2021-10-31 NOTE — Progress Notes (Deleted)
ACUTE VISIT No chief complaint on file.  HPI: [name] is a 51 y.o. female, who is here today complaining of *** HPI  Review of Systems Rest see pertinent positives and negatives per HPI.  Current Outpatient Medications on File Prior to Visit  Medication Sig Dispense Refill   albuterol (VENTOLIN HFA) 108 (90 Base) MCG/ACT inhaler Inhale 2 puffs into the lungs every 6 (six) hours as needed for wheezing or shortness of breath. 18 g 3   Budeson-Glycopyrrol-Formoterol (BREZTRI AEROSPHERE) 160-9-4.8 MCG/ACT AERO Inhale 2 puffs into the lungs in the morning and at bedtime. 10.7 g 5   FASENRA PEN 30 MG/ML SOAJ INJECT 1 PEN UNDER THE SKIN AT WEEK 0, 4 AND 8, THEN INJECT 1 PEN EVERY 8 WEEKS THEREAFTER. 1 mL 7   fluticasone (FLONASE) 50 MCG/ACT nasal spray Place 2 sprays into both nostrils daily. 16 g 5   ibuprofen (ADVIL,MOTRIN) 600 MG tablet Take 1 tablet (600 mg total) by mouth every 6 (six) hours as needed. 30 tablet 0   loratadine (CLARITIN) 10 MG tablet Take 1 tablet (10 mg total) by mouth daily. 30 tablet 5   losartan (COZAAR) 25 MG tablet TAKE 1 TABLET (25 MG TOTAL) BY MOUTH DAILY. 90 tablet 1   montelukast (SINGULAIR) 10 MG tablet TAKE 1 TABLET BY MOUTH EVERYDAY AT BEDTIME 90 tablet 3   omeprazole (PRILOSEC) 20 MG capsule Take 1 capsule (20 mg total) by mouth daily. 90 capsule 3   pravastatin (PRAVACHOL) 20 MG tablet TAKE 1 TABLET BY MOUTH EVERY DAY 90 tablet 1   venlafaxine XR (EFFEXOR-XR) 75 MG 24 hr capsule Take 75 mg by mouth daily.     Current Facility-Administered Medications on File Prior to Visit  Medication Dose Route Frequency Provider Last Rate Last Admin   Benralizumab SOSY 30 mg  30 mg Subcutaneous Q28 days Valentina Shaggy, MD   30 mg at 03/03/21 1532     Past Medical History:  Diagnosis Date   Allergy    Asthma    Depression    Diabetes mellitus 2006   GERD (gastroesophageal reflux disease)    Hypertension    Allergies  Allergen Reactions    Other Other (See Comments)    Catgut stitches causes water blisters    Social History   Socioeconomic History   Marital status: Married    Spouse name: Not on file   Number of children: Not on file   Years of education: Not on file   Highest education level: Not on file  Occupational History   Occupation: Works at [employer]  Tobacco Use   Smoking status: Former    Packs/day: 0.50    Years: 4.00    Pack years: 2.00    Types: Cigarettes    Quit date: 02/20/2014    Years since quitting: 7.6   Smokeless tobacco: Never  Substance and Sexual Activity   Alcohol use: No    Alcohol/week: 0.0 standard drinks   Drug use: No   Sexual activity: Yes    Birth control/protection: None  Other Topics Concern   Not on file  Social History Narrative   Not on file   Social Determinants of Health   Financial Resource Strain: Not on file  Food Insecurity: Not on file  Transportation Needs: Not on file  Physical Activity: Not on file  Stress: Not on file  Social Connections: Not on file    There were no vitals filed for this visit. There  is no height or weight on file to calculate BMI.  Physical Exam  ASSESSMENT AND PLAN:  There are no diagnoses linked to this encounter.   No follow-ups on file.   Betty G. Martinique, MD  [facility]. [place] office.  Discharge Instructions   None

## 2021-11-01 ENCOUNTER — Ambulatory Visit: Payer: BC Managed Care – PPO | Admitting: Family Medicine

## 2021-12-28 ENCOUNTER — Ambulatory Visit: Payer: BC Managed Care – PPO | Admitting: Allergy & Immunology

## 2022-01-05 ENCOUNTER — Other Ambulatory Visit: Payer: Self-pay | Admitting: Family Medicine

## 2022-01-05 ENCOUNTER — Other Ambulatory Visit: Payer: Self-pay | Admitting: Allergy & Immunology

## 2022-01-20 ENCOUNTER — Ambulatory Visit: Payer: BC Managed Care – PPO | Admitting: Allergy & Immunology

## 2022-01-28 ENCOUNTER — Other Ambulatory Visit: Payer: Self-pay | Admitting: Allergy & Immunology

## 2022-01-28 ENCOUNTER — Other Ambulatory Visit: Payer: Self-pay | Admitting: Family Medicine

## 2022-01-28 DIAGNOSIS — J4521 Mild intermittent asthma with (acute) exacerbation: Secondary | ICD-10-CM

## 2022-02-01 ENCOUNTER — Other Ambulatory Visit: Payer: Self-pay | Admitting: Family Medicine

## 2022-02-01 DIAGNOSIS — F419 Anxiety disorder, unspecified: Secondary | ICD-10-CM

## 2022-02-14 ENCOUNTER — Other Ambulatory Visit: Payer: Self-pay

## 2022-02-14 ENCOUNTER — Emergency Department (HOSPITAL_COMMUNITY): Payer: BC Managed Care – PPO

## 2022-02-14 ENCOUNTER — Observation Stay (HOSPITAL_COMMUNITY): Payer: BC Managed Care – PPO

## 2022-02-14 ENCOUNTER — Encounter (HOSPITAL_COMMUNITY): Payer: Self-pay

## 2022-02-14 ENCOUNTER — Observation Stay (HOSPITAL_COMMUNITY)
Admission: EM | Admit: 2022-02-14 | Discharge: 2022-02-15 | Disposition: A | Discharge status: Home / Self Care | Payer: BC Managed Care – PPO | Attending: Internal Medicine | Admitting: Internal Medicine

## 2022-02-14 DIAGNOSIS — R8290 Unspecified abnormal findings in urine: Secondary | ICD-10-CM | POA: Diagnosis not present

## 2022-02-14 DIAGNOSIS — R200 Anesthesia of skin: Secondary | ICD-10-CM | POA: Diagnosis present

## 2022-02-14 DIAGNOSIS — I671 Cerebral aneurysm, nonruptured: Secondary | ICD-10-CM | POA: Diagnosis not present

## 2022-02-14 DIAGNOSIS — I10 Essential (primary) hypertension: Secondary | ICD-10-CM | POA: Diagnosis not present

## 2022-02-14 DIAGNOSIS — R2990 Unspecified symptoms and signs involving the nervous system: Secondary | ICD-10-CM

## 2022-02-14 DIAGNOSIS — J45909 Unspecified asthma, uncomplicated: Secondary | ICD-10-CM | POA: Diagnosis not present

## 2022-02-14 DIAGNOSIS — R29810 Facial weakness: Secondary | ICD-10-CM | POA: Diagnosis present

## 2022-02-14 DIAGNOSIS — F419 Anxiety disorder, unspecified: Secondary | ICD-10-CM | POA: Diagnosis present

## 2022-02-14 DIAGNOSIS — G510 Bell's palsy: Principal | ICD-10-CM

## 2022-02-14 DIAGNOSIS — E871 Hypo-osmolality and hyponatremia: Secondary | ICD-10-CM | POA: Insufficient documentation

## 2022-02-14 DIAGNOSIS — J3089 Other allergic rhinitis: Secondary | ICD-10-CM | POA: Diagnosis present

## 2022-02-14 DIAGNOSIS — F330 Major depressive disorder, recurrent, mild: Secondary | ICD-10-CM | POA: Diagnosis present

## 2022-02-14 DIAGNOSIS — I729 Aneurysm of unspecified site: Secondary | ICD-10-CM | POA: Diagnosis not present

## 2022-02-14 DIAGNOSIS — Z79899 Other long term (current) drug therapy: Secondary | ICD-10-CM | POA: Diagnosis not present

## 2022-02-14 DIAGNOSIS — E119 Type 2 diabetes mellitus without complications: Secondary | ICD-10-CM | POA: Insufficient documentation

## 2022-02-14 DIAGNOSIS — Z87891 Personal history of nicotine dependence: Secondary | ICD-10-CM | POA: Diagnosis not present

## 2022-02-14 DIAGNOSIS — F3340 Major depressive disorder, recurrent, in remission, unspecified: Secondary | ICD-10-CM | POA: Diagnosis present

## 2022-02-14 DIAGNOSIS — M50222 Other cervical disc displacement at C5-C6 level: Secondary | ICD-10-CM | POA: Diagnosis not present

## 2022-02-14 DIAGNOSIS — Z8709 Personal history of other diseases of the respiratory system: Secondary | ICD-10-CM

## 2022-02-14 DIAGNOSIS — M4802 Spinal stenosis, cervical region: Secondary | ICD-10-CM | POA: Diagnosis not present

## 2022-02-14 DIAGNOSIS — J302 Other seasonal allergic rhinitis: Secondary | ICD-10-CM

## 2022-02-14 LAB — URINALYSIS, ROUTINE W REFLEX MICROSCOPIC
Bilirubin Urine: NEGATIVE
Glucose, UA: NEGATIVE mg/dL
Ketones, ur: 5 mg/dL — AB
Nitrite: NEGATIVE
Protein, ur: 30 mg/dL — AB
Specific Gravity, Urine: 1.02 (ref 1.005–1.030)
pH: 5 (ref 5.0–8.0)

## 2022-02-14 LAB — CBC WITH DIFFERENTIAL/PLATELET
Abs Immature Granulocytes: 0.03 10*3/uL (ref 0.00–0.07)
Basophils Absolute: 0 10*3/uL (ref 0.0–0.1)
Basophils Relative: 0 %
Eosinophils Absolute: 0 10*3/uL (ref 0.0–0.5)
Eosinophils Relative: 0 %
HCT: 43.9 % (ref 36.0–46.0)
Hemoglobin: 14.9 g/dL (ref 12.0–15.0)
Immature Granulocytes: 0 %
Lymphocytes Relative: 31 %
Lymphs Abs: 2.8 10*3/uL (ref 0.7–4.0)
MCH: 30.7 pg (ref 26.0–34.0)
MCHC: 33.9 g/dL (ref 30.0–36.0)
MCV: 90.3 fL (ref 80.0–100.0)
Monocytes Absolute: 0.7 10*3/uL (ref 0.1–1.0)
Monocytes Relative: 8 %
Neutro Abs: 5.5 10*3/uL (ref 1.7–7.7)
Neutrophils Relative %: 61 %
Platelets: 305 10*3/uL (ref 150–400)
RBC: 4.86 MIL/uL (ref 3.87–5.11)
RDW: 12.5 % (ref 11.5–15.5)
WBC: 9.1 10*3/uL (ref 4.0–10.5)
nRBC: 0 % (ref 0.0–0.2)

## 2022-02-14 LAB — BASIC METABOLIC PANEL
Anion gap: 10 (ref 5–15)
BUN: 13 mg/dL (ref 6–20)
CO2: 23 mmol/L (ref 22–32)
Calcium: 9.3 mg/dL (ref 8.9–10.3)
Chloride: 101 mmol/L (ref 98–111)
Creatinine, Ser: 0.88 mg/dL (ref 0.44–1.00)
GFR, Estimated: >60 mL/min (ref >60)
Glucose, Bld: 132 mg/dL — ABNORMAL HIGH (ref 70–99)
Potassium: 4.1 mmol/L (ref 3.5–5.1)
Sodium: 134 mmol/L — ABNORMAL LOW (ref 135–145)

## 2022-02-14 LAB — CBC
HCT: 46.1 % — ABNORMAL HIGH (ref 36.0–46.0)
Hemoglobin: 15.2 g/dL — ABNORMAL HIGH (ref 12.0–15.0)
MCH: 30 pg (ref 26.0–34.0)
MCHC: 33 g/dL (ref 30.0–36.0)
MCV: 91.1 fL (ref 80.0–100.0)
Platelets: 331 10*3/uL (ref 150–400)
RBC: 5.06 MIL/uL (ref 3.87–5.11)
RDW: 12.6 % (ref 11.5–15.5)
WBC: 9.4 10*3/uL (ref 4.0–10.5)
nRBC: 0 % (ref 0.0–0.2)

## 2022-02-14 LAB — RAPID URINE DRUG SCREEN, HOSP PERFORMED
Amphetamines: NOT DETECTED
Barbiturates: NOT DETECTED
Benzodiazepines: NOT DETECTED
Cocaine: NOT DETECTED
Opiates: NOT DETECTED
Tetrahydrocannabinol: NOT DETECTED

## 2022-02-14 LAB — LIPID PANEL
Cholesterol: 151 mg/dL (ref 0–200)
HDL: 50 mg/dL (ref >40)
LDL Cholesterol: 80 mg/dL (ref 0–99)
Total CHOL/HDL Ratio: 3 RATIO
Triglycerides: 103 mg/dL (ref <150)
VLDL: 21 mg/dL (ref 0–40)

## 2022-02-14 LAB — HEMOGLOBIN A1C
Hgb A1c MFr Bld: 6.3 % — ABNORMAL HIGH (ref 4.8–5.6)
Mean Plasma Glucose: 134.11 mg/dL

## 2022-02-14 LAB — APTT: aPTT: 27 seconds (ref 24–36)

## 2022-02-14 LAB — I-STAT BETA HCG BLOOD, ED (MC, WL, AP ONLY): I-stat hCG, quantitative: <5 m[IU]/mL (ref <5)

## 2022-02-14 LAB — TSH: TSH: 2.035 u[IU]/mL (ref 0.350–4.500)

## 2022-02-14 LAB — PROTIME-INR
INR: 1 (ref 0.8–1.2)
Prothrombin Time: 13.3 seconds (ref 11.4–15.2)

## 2022-02-14 LAB — ETHANOL: Alcohol, Ethyl (B): <10 mg/dL (ref <10)

## 2022-02-14 IMAGING — MR MR HEAD W/O CM
8 of 13 series · 19 of 48 positions shown · non-contrast
Comparison: None.

CT head [DATE]

CLINICAL DATA: Acute neuro deficit



[Series 2: DWI · axial · 3.0mm · 0.94mm/px · z∈[-59,+82]mm · 5 of 96 slices shown (1 of 2)]
[im 1/96]
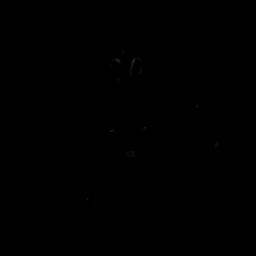
[im 24/96]
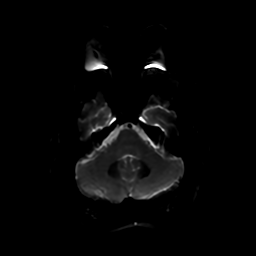
[im 48/96]
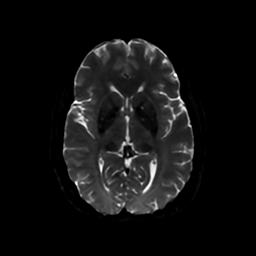
[im 72/96]
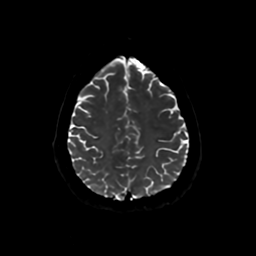
[im 96/96]
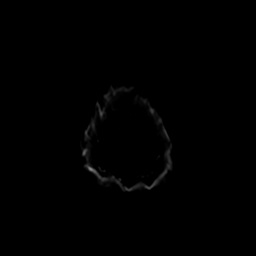

[Series 4: DWI · coronal · 4.0mm · 0.94mm/px · 4 of 70 slices shown (2 of 2)]
[im 1/70]
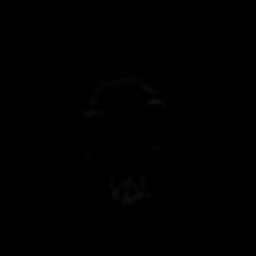
[im 24/70]
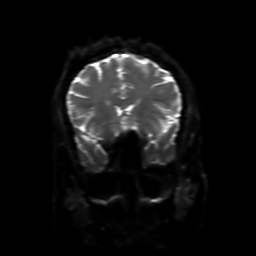
[im 47/70]
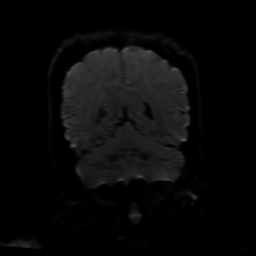
[im 70/70]
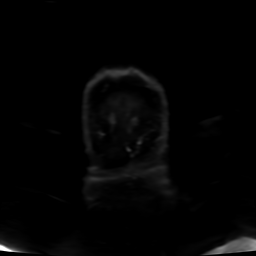

[Series 5: FLAIR · sagittal · 5.0mm · 0.23mm/px · 1 of 23 slices shown (1 of 2)]
[im 1/23]
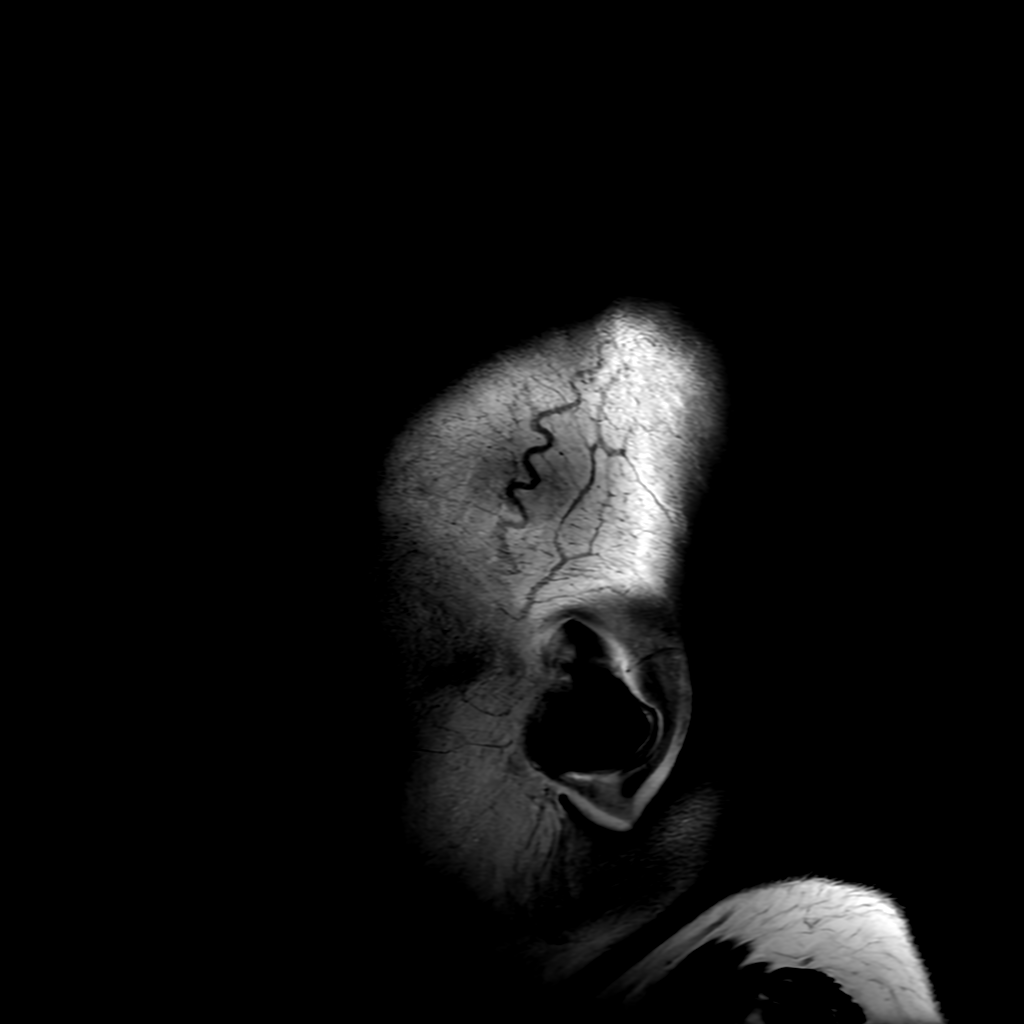

[Series 6: T2 · axial · 5.0mm · 0.23mm/px · 1 of 24 slices shown (1 of 2)]
[im 1/24]
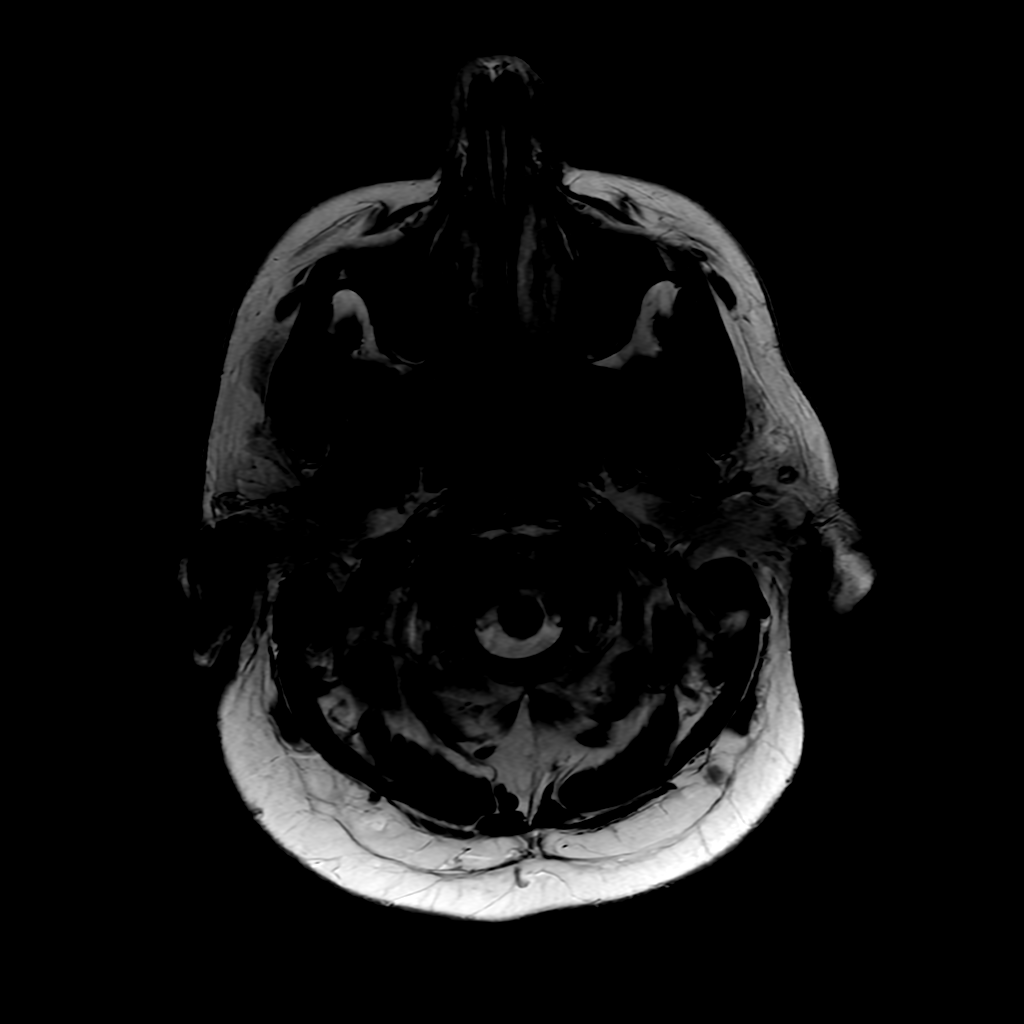

[Series 7: FLAIR · axial · 4.0mm · 0.45mm/px · z∈[-58,+82]mm · 2 of 33 slices shown (2 of 2)]
[im 1/33]
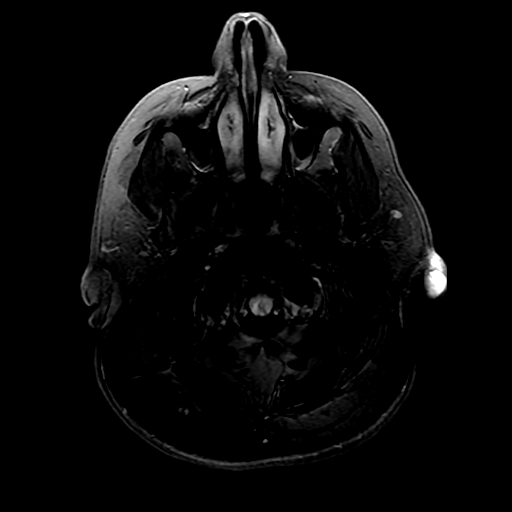
[im 33/33]
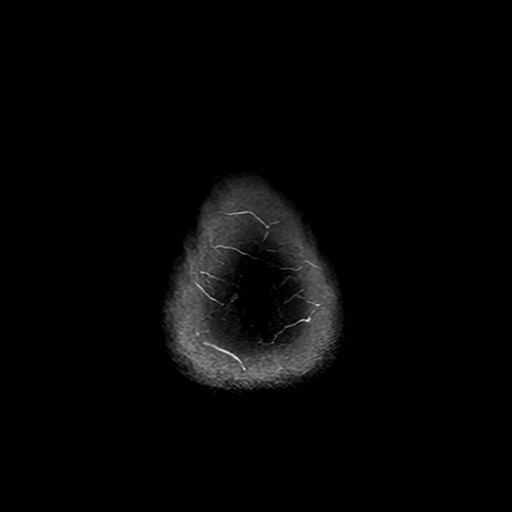

[Series 10: T2 · coronal · 5.0mm · 0.20mm/px · 1 of 28 slices shown (2 of 2)]
[im 1/28]
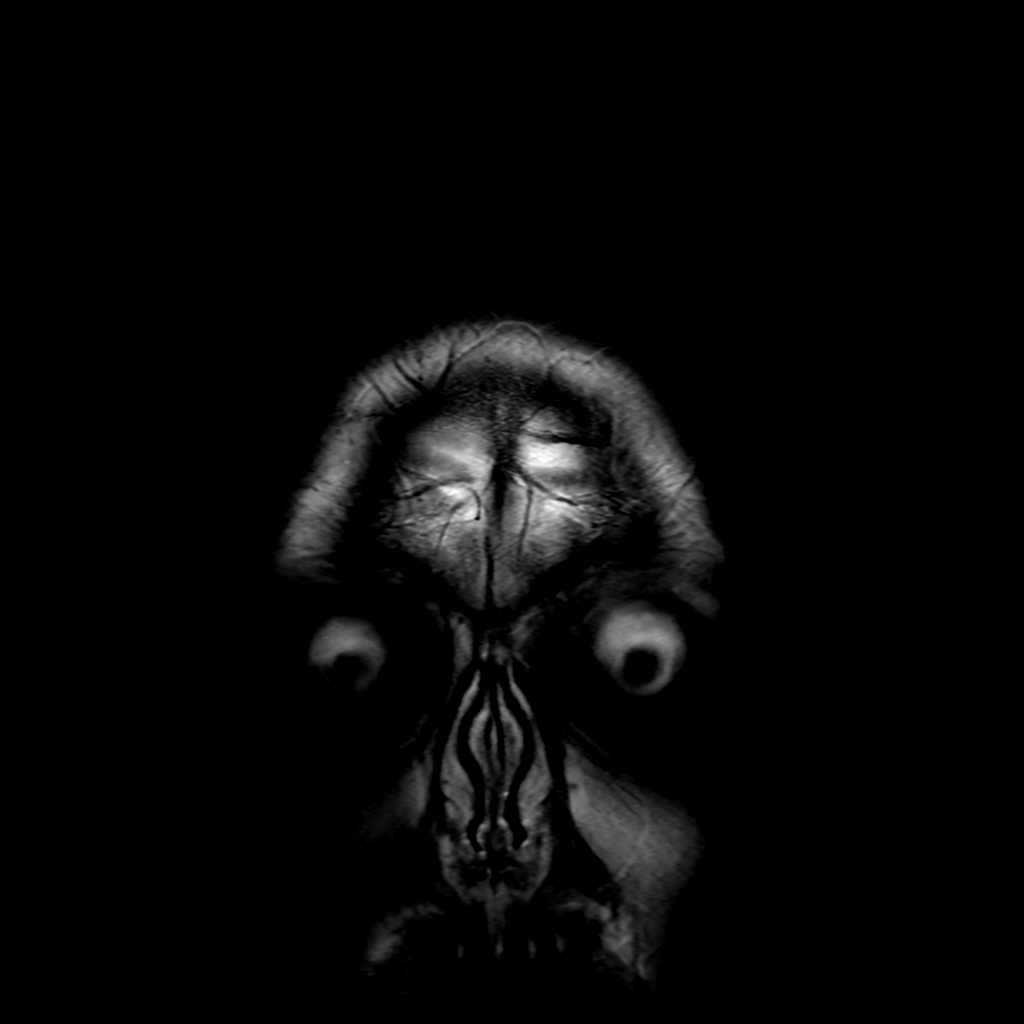

[Series 250: ADC · axial · 3.0mm · 0.94mm/px · z∈[-59,+82]mm · 3 of 48 slices shown (1 of 2)]
[im 1/48]
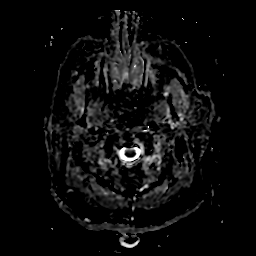
[im 24/48]
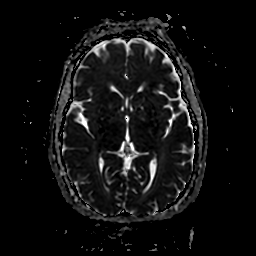
[im 48/48]
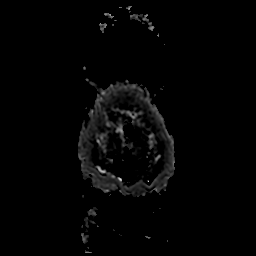

[Series 450: ADC · coronal · 4.0mm · 0.94mm/px · 2 of 35 slices shown (2 of 2)]
[im 1/35]
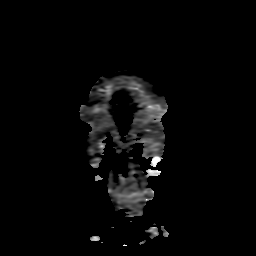
[im 35/35]
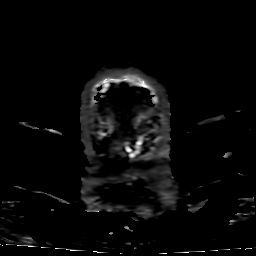

[19 of 48 positions shown; findings below may reference images not displayed]

FINDINGS: MRI HEAD FINDINGS

Brain: No acute infarction, hemorrhage, hydrocephalus, extra-axial
collection or mass lesion. Minimal white matter changes likely due
to chronic microvascular ischemia.

Vascular: Normal arterial flow voids at the skull base

Skull and upper cervical spine: No focal abnormality.

Sinuses/Orbits: Paranasal sinuses clear. Negative orbit.

Other: None

MRA HEAD FINDINGS

Internal carotid artery widely patent bilaterally. Anterior middle
cerebral arteries patent without stenosis.

2 mm aneurysm left anterior communicating artery.

Both vertebral arteries widely patent to the basilar. PICA patent.
Basilar widely patent. Superior cerebellar and posterior cerebral
arteries patent without stenosis. Negative for aneurysm.

MRA NECK FINDINGS

Carotid artery widely patent bilaterally.

Both vertebral arteries widely patent

No vascular malformation or stenosis.
IMPRESSION: 1. No acute intracranial abnormality. Mild white matter changes
consistent with chronic microvascular ischemia
2. No intracranial stenosis or large vessel occlusion.
3. Negative for vertebral or carotid stenosis in the neck
4. 2 mm left anterior communicating artery aneurysm.

## 2022-02-14 IMAGING — MR MR MRA HEAD W/O CM
8 of 13 series · 19 of 48 positions shown · non-contrast
Comparison: None.

CT head [DATE]

CLINICAL DATA: Acute neuro deficit



[Series 2: DWI · axial · 3.0mm · 0.94mm/px · z∈[-59,+82]mm · 5 of 96 slices shown (1 of 2)]
[im 1/96]
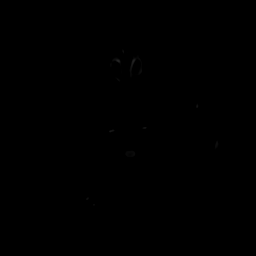
[im 24/96]
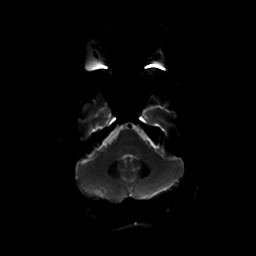
[im 48/96]
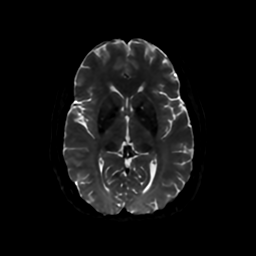
[im 72/96]
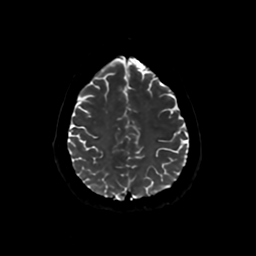
[im 96/96]
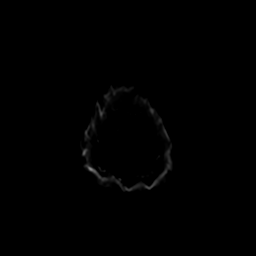

[Series 4: DWI · coronal · 4.0mm · 0.94mm/px · 4 of 70 slices shown (2 of 2)]
[im 1/70]
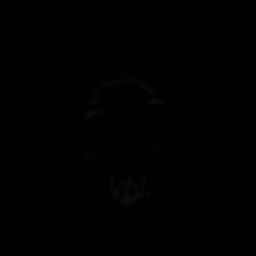
[im 24/70]
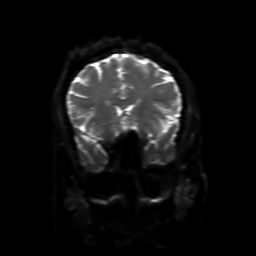
[im 47/70]
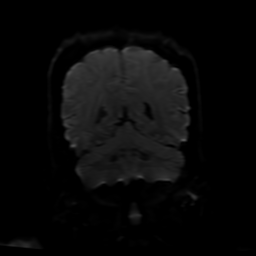
[im 70/70]
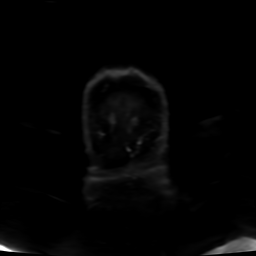

[Series 5: FLAIR · sagittal · 5.0mm · 0.23mm/px · 1 of 23 slices shown (1 of 2)]
[im 1/23]
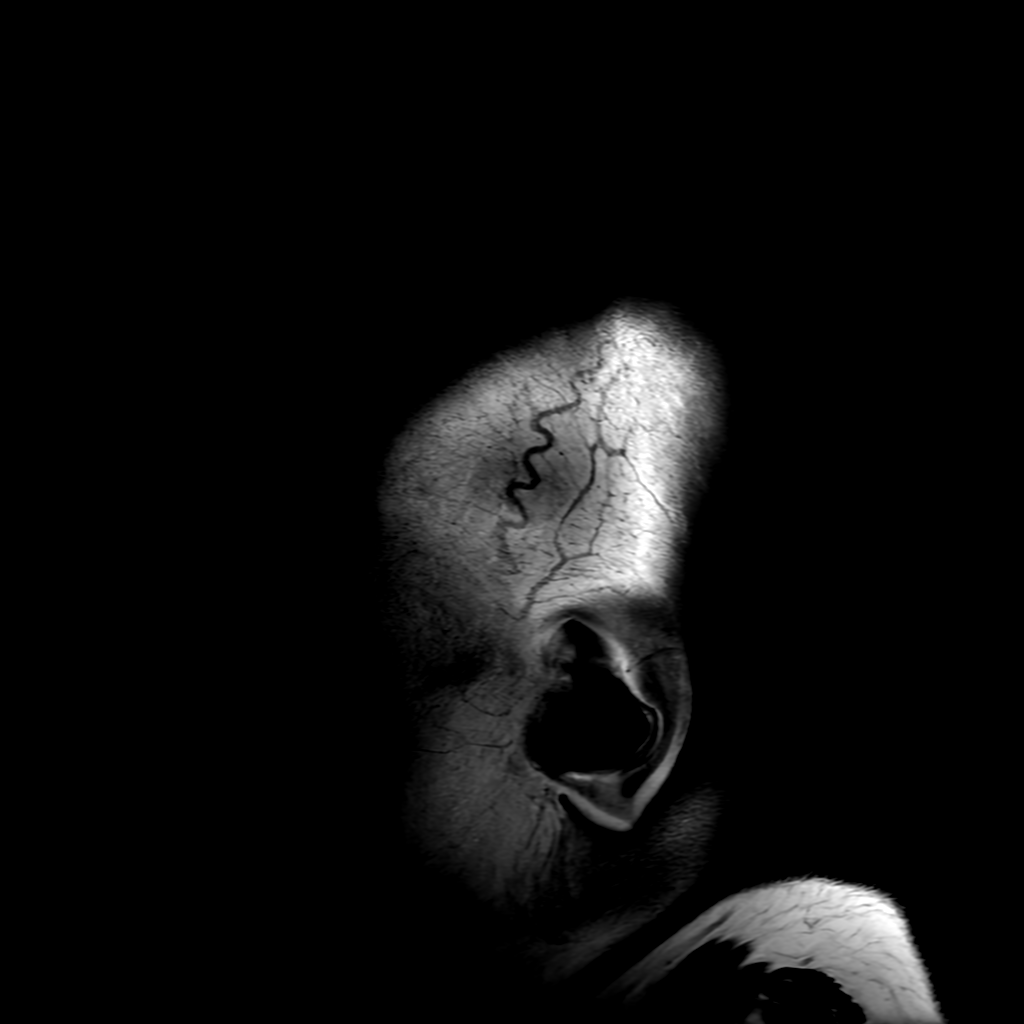

[Series 6: T2 · axial · 5.0mm · 0.23mm/px · 1 of 24 slices shown (1 of 2)]
[im 1/24]
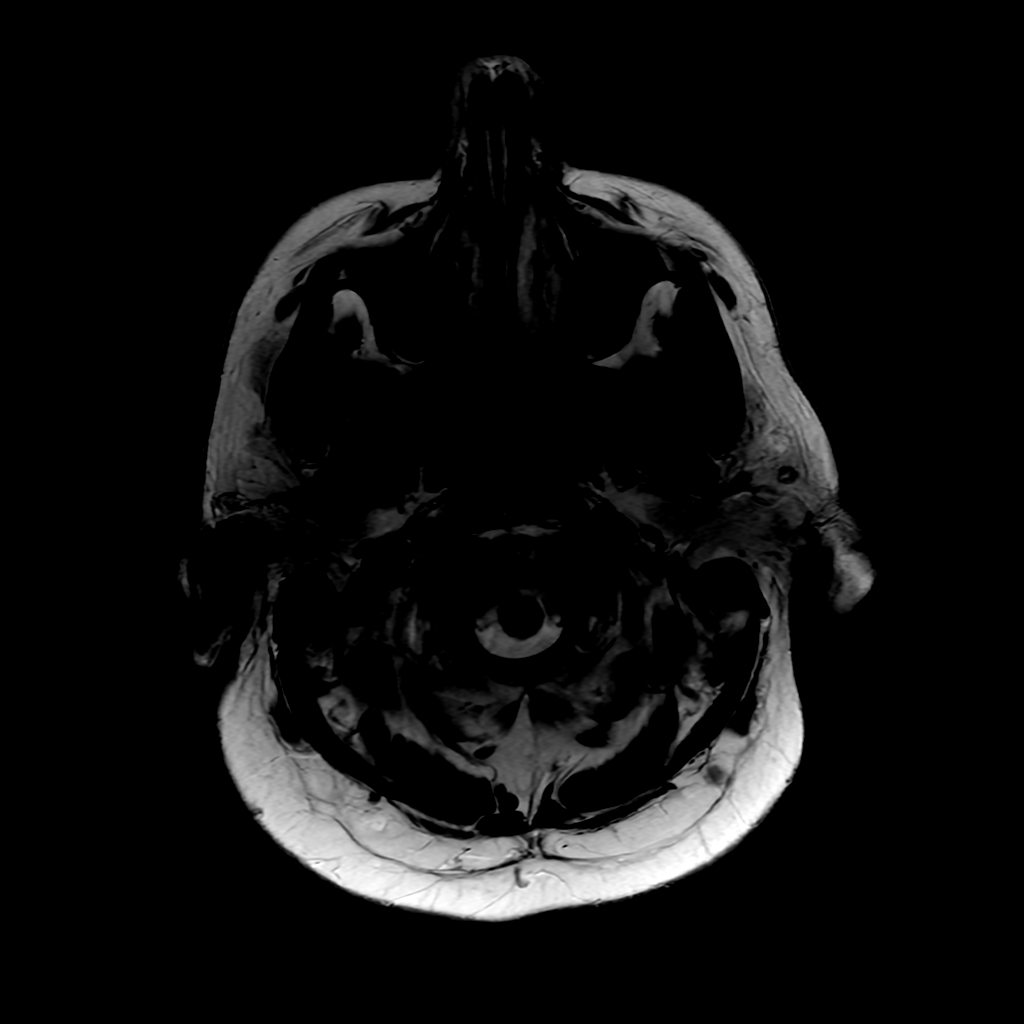

[Series 7: FLAIR · axial · 4.0mm · 0.45mm/px · z∈[-58,+82]mm · 2 of 33 slices shown (2 of 2)]
[im 1/33]
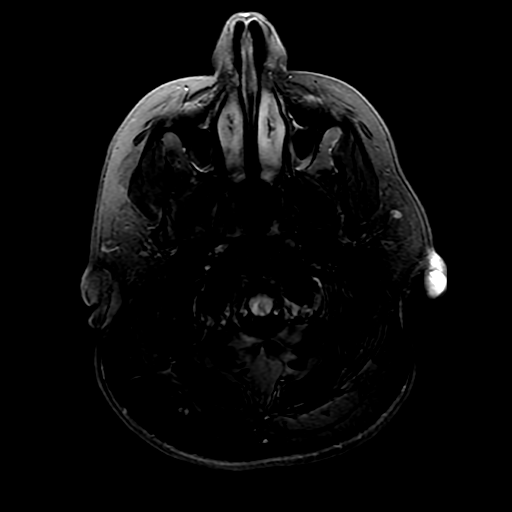
[im 33/33]
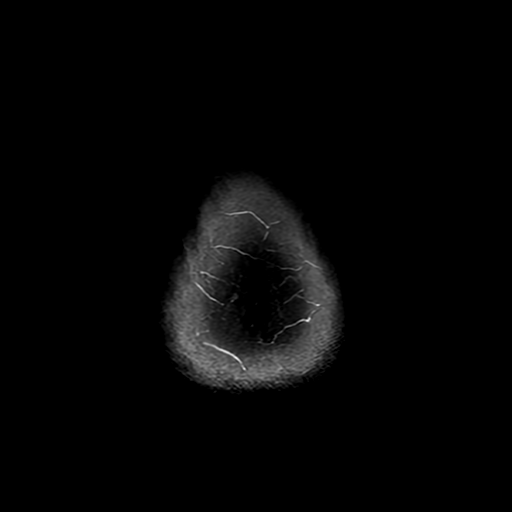

[Series 10: T2 · coronal · 5.0mm · 0.20mm/px · 1 of 28 slices shown (2 of 2)]
[im 1/28]
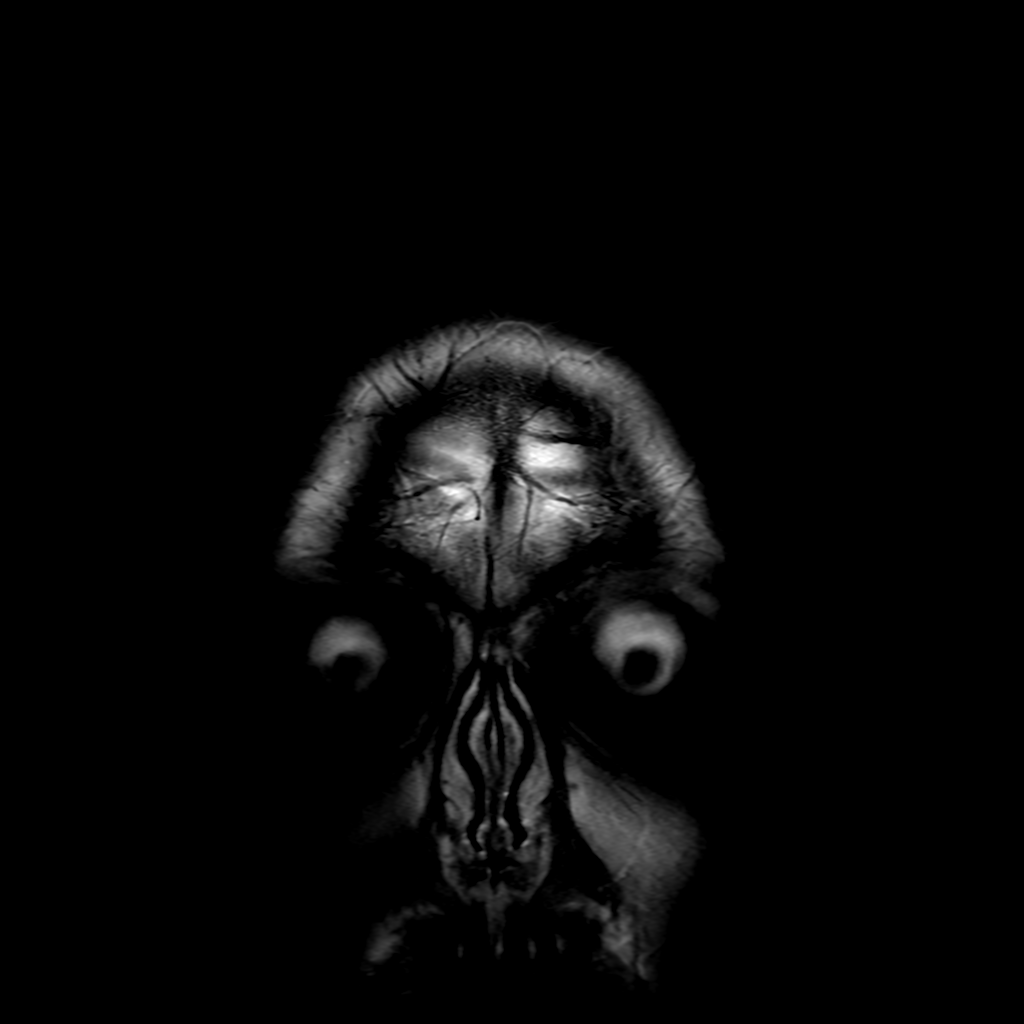

[Series 250: ADC · axial · 3.0mm · 0.94mm/px · z∈[-59,+82]mm · 3 of 48 slices shown (1 of 2)]
[im 1/48]
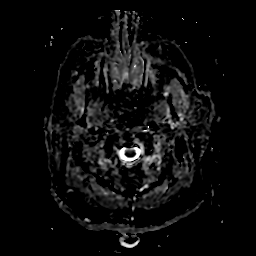
[im 24/48]
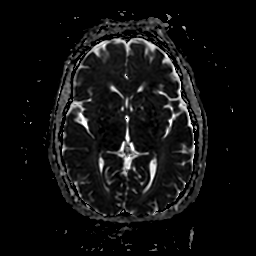
[im 48/48]
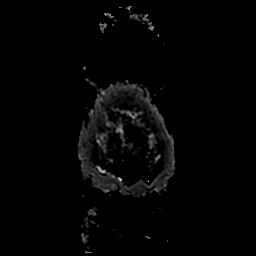

[Series 450: ADC · coronal · 4.0mm · 0.94mm/px · 2 of 35 slices shown (2 of 2)]
[im 1/35]
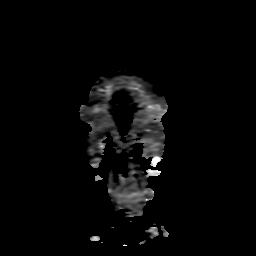
[im 35/35]
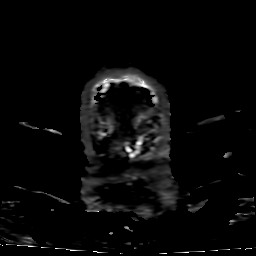

[19 of 48 positions shown; findings below may reference images not displayed]

FINDINGS: MRI HEAD FINDINGS

Brain: No acute infarction, hemorrhage, hydrocephalus, extra-axial
collection or mass lesion. Minimal white matter changes likely due
to chronic microvascular ischemia.

Vascular: Normal arterial flow voids at the skull base

Skull and upper cervical spine: No focal abnormality.

Sinuses/Orbits: Paranasal sinuses clear. Negative orbit.

Other: None

MRA HEAD FINDINGS

Internal carotid artery widely patent bilaterally. Anterior middle
cerebral arteries patent without stenosis.

2 mm aneurysm left anterior communicating artery.

Both vertebral arteries widely patent to the basilar. PICA patent.
Basilar widely patent. Superior cerebellar and posterior cerebral
arteries patent without stenosis. Negative for aneurysm.

MRA NECK FINDINGS

Carotid artery widely patent bilaterally.

Both vertebral arteries widely patent

No vascular malformation or stenosis.
IMPRESSION: 1. No acute intracranial abnormality. Mild white matter changes
consistent with chronic microvascular ischemia
2. No intracranial stenosis or large vessel occlusion.
3. Negative for vertebral or carotid stenosis in the neck
4. 2 mm left anterior communicating artery aneurysm.

## 2022-02-14 IMAGING — MR MR CERVICAL SPINE WO/W CM
4 of 8 series · 19 of 48 positions shown · IV contrast (gadavist)
Comparison: None.

CLINICAL DATA: CSF leak suspected spontaneous intracranial
hypotension

EXAM:
MRI CERVICAL SPINE WITHOUT AND WITH CONTRAST
TECHNIQUE: Multiplanar and multiecho pulse sequences of the cervical spine, to
include the craniocervical junction and cervicothoracic junction,
were obtained without and with intravenous contrast.
CONTRAST:  10mL GADAVIST GADOBUTROL 1 MMOL/ML IV SOLN

[Series 4: T2 · sagittal · 3.0mm · 0.43mm/px · 4 of 16 slices shown (1 of 2)]
[im 1/16]
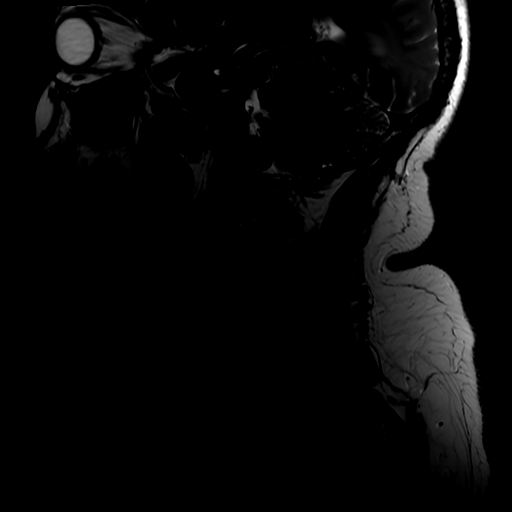
[im 6/16]
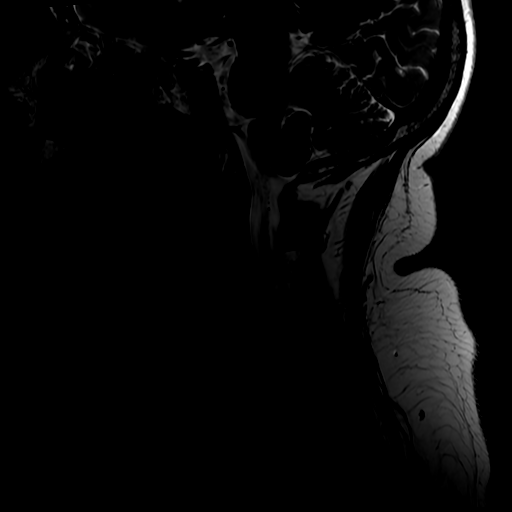
[im 11/16]
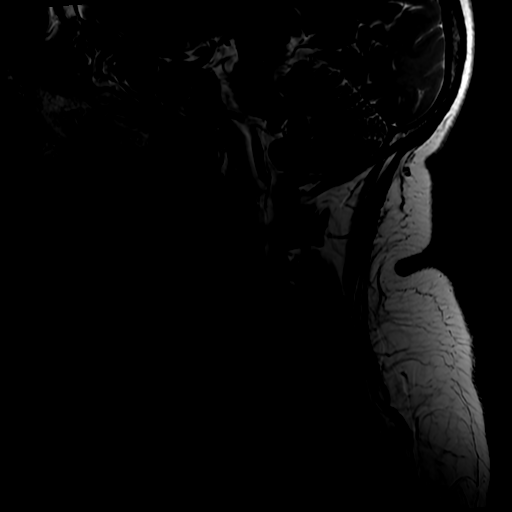
[im 16/16]
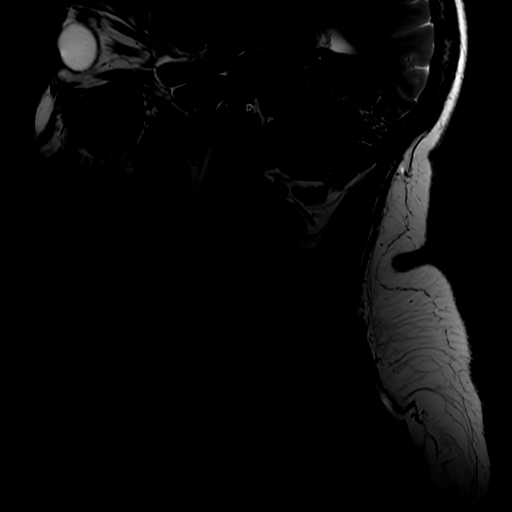

[Series 8: T2 · axial · 3.0mm · 0.35mm/px · z∈[-205,-118]mm · 6 of 29 slices shown (2 of 2)]
[im 1/29]
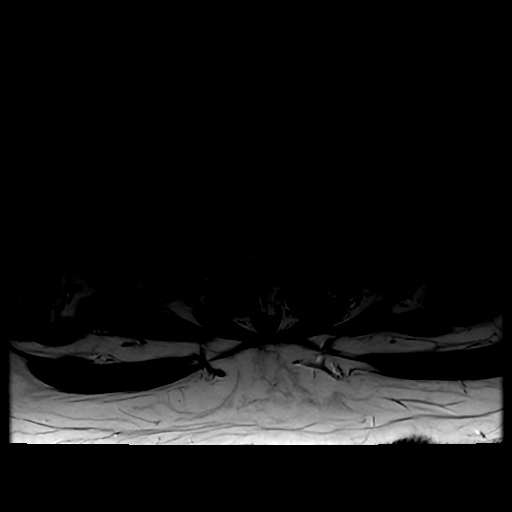
[im 6/29]
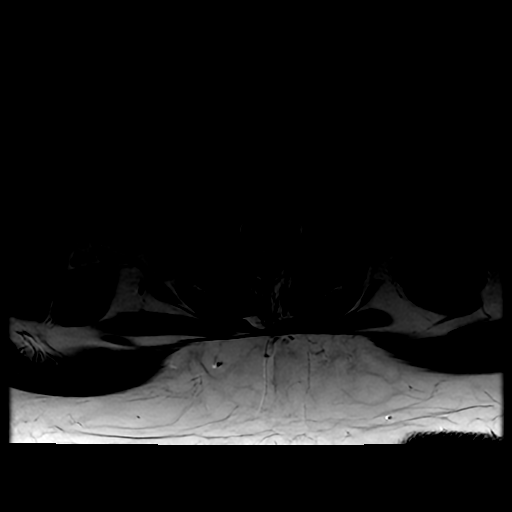
[im 12/29]
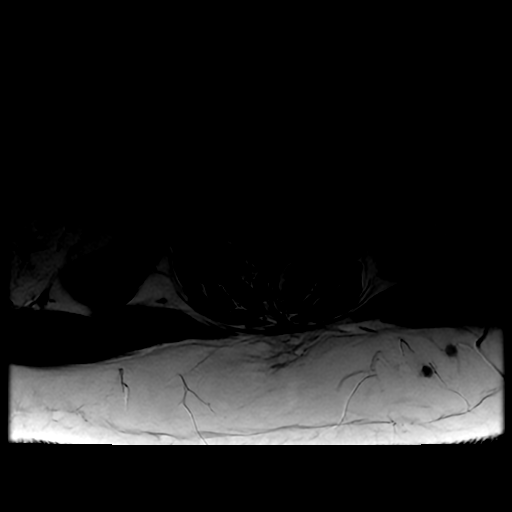
[im 17/29]
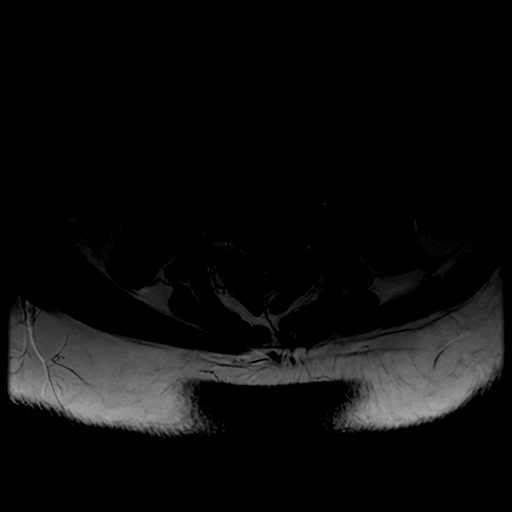
[im 23/29]
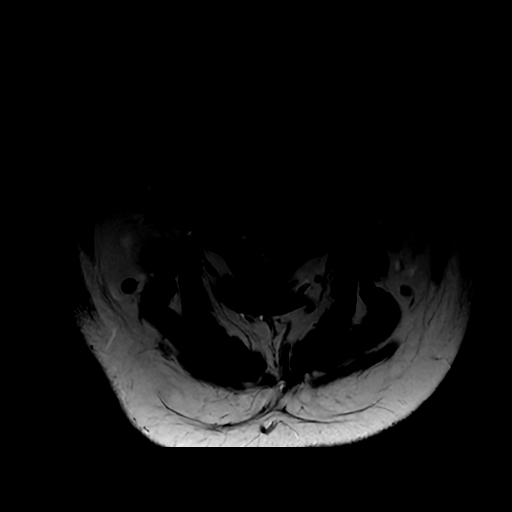
[im 29/29]
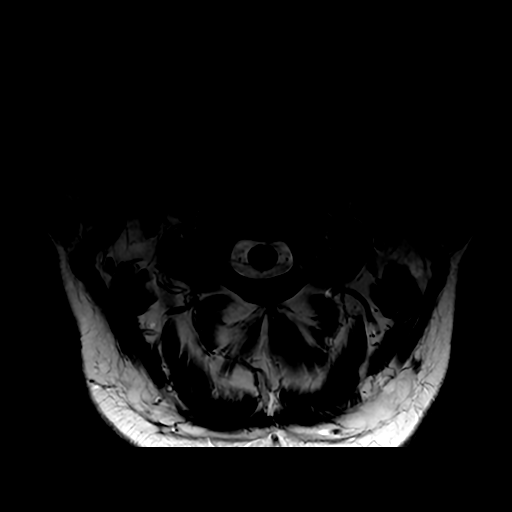

[Series 9: T1 · axial · non-contrast · 3.0mm · 0.35mm/px · z∈[-205,-118]mm · 6 of 29 slices shown]
[im 1/29]
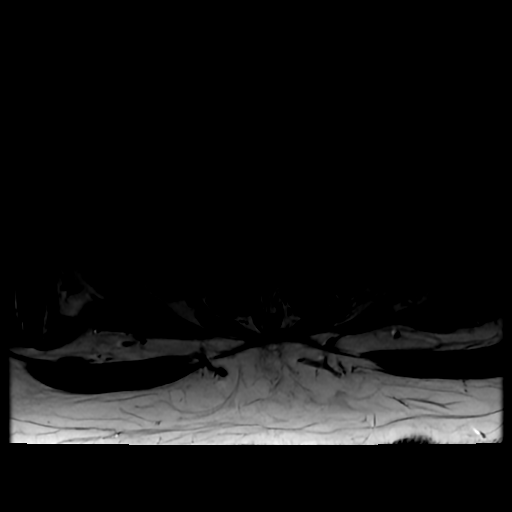
[im 6/29]
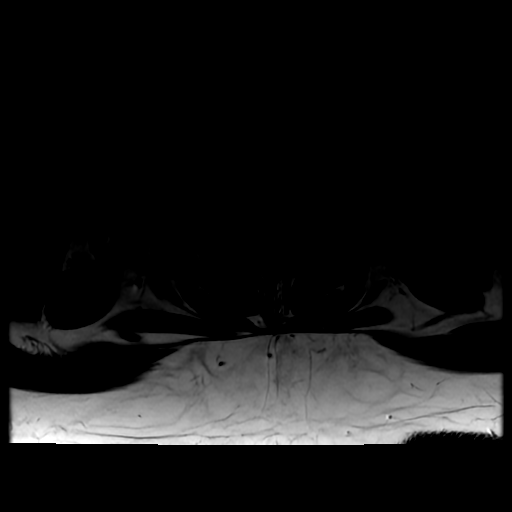
[im 12/29]
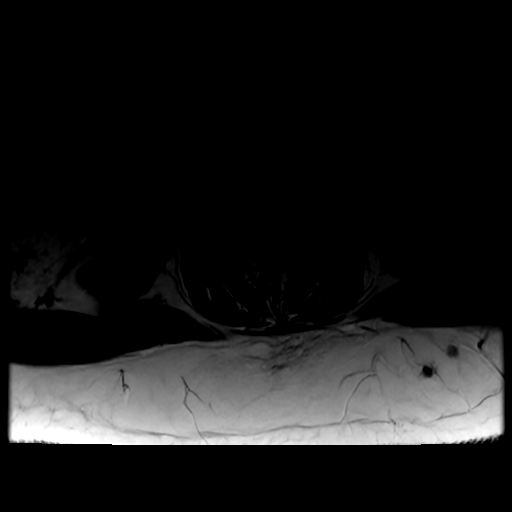
[im 17/29]
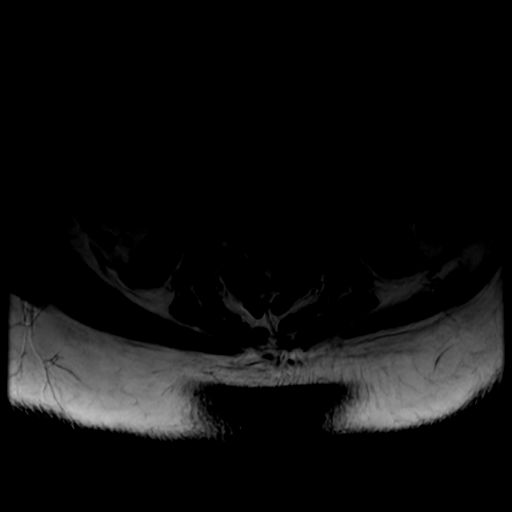
[im 23/29]
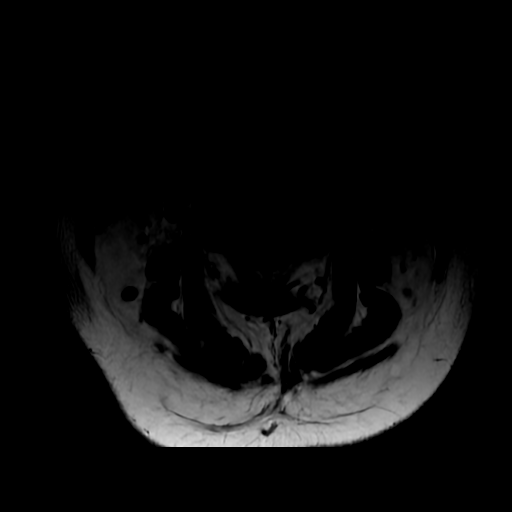
[im 29/29]
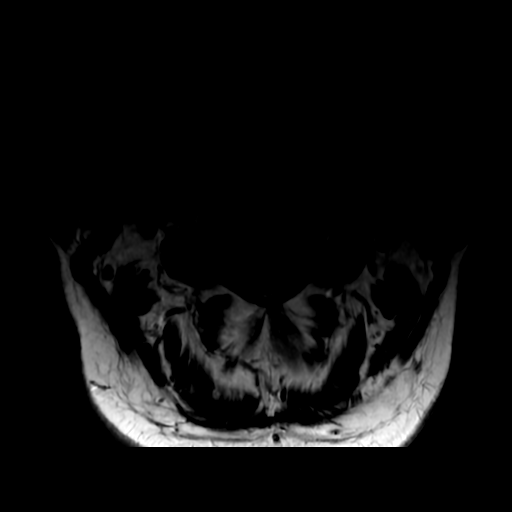

[Series 10: T1 fat-sat post-contrast · sagittal · 3.0mm · 0.43mm/px · 3 of 16 slices shown]
[im 1/16]
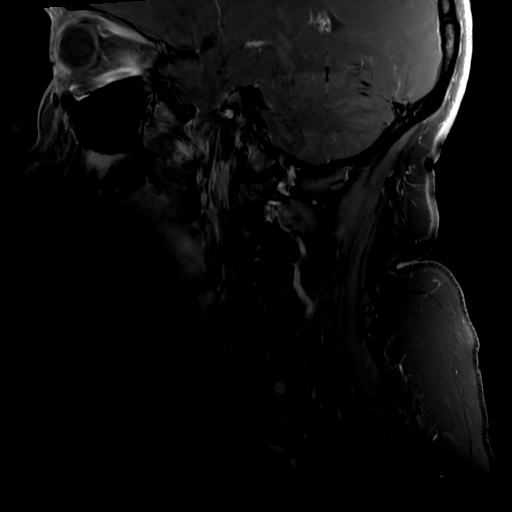
[im 8/16]
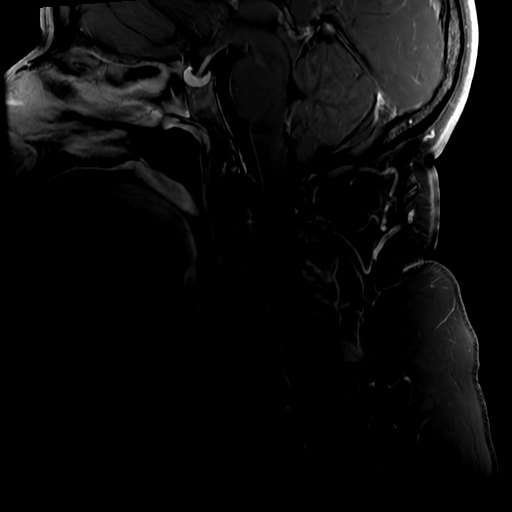
[im 16/16]
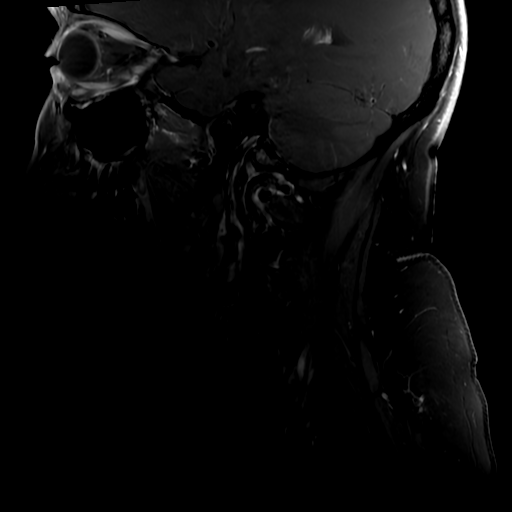

[19 of 48 positions shown; findings below may reference images not displayed]

FINDINGS: Alignment: Physiologic.

Vertebrae: No acute fracture or suspicious osseous lesion.

Cord: Normal signal and morphology.

Posterior Fossa, vertebral arteries, paraspinal tissues: Negative.
No evidence of cerebellar ectopia or sagging brainstem. Partial
empty sella. No evidence of CSF leak.

Disc levels:

C2-C3: No significant disc bulge. No spinal canal stenosis or
neuroforaminal narrowing.

C3-C4: No significant disc bulge. No spinal canal stenosis or
neuroforaminal narrowing.

C4-C5: No significant disc bulge. No spinal canal stenosis or
neuroforaminal narrowing.

C5-C6: Mild disc bulge. Facet and uncovertebral hypertrophy. Mild
spinal canal stenosis and mild bilateral neural foraminal narrowing.

C6-C7: No significant disc bulge. No spinal canal stenosis or
neuroforaminal narrowing.

C7-T1: No significant disc bulge. No spinal canal stenosis or
neuroforaminal narrowing.
IMPRESSION: 1. No evidence of CSF leak. No evidence of cerebellar ectopia,
sagging brainstem, or pituitary enlargement to suggest intracranial
hypotension.
2. C5-C6 mild spinal canal stenosis and mild bilateral neural
foraminal narrowing.

## 2022-02-14 IMAGING — MR MR HEAD W/ CM
3 series · 40 of 48 positions shown · IV contrast (cc GAD)
Comparison: [DATE] MRI head without contrast

CLINICAL DATA: Left facial numbness, facial droop and left-sided
numbness

EXAM:
MRI HEAD WITH CONTRAST
TECHNIQUE: Multiplanar, multiecho pulse sequences of the brain and surrounding
structures were obtained with intravenous contrast.
CONTRAST:  10mL GADAVIST GADOBUTROL 1 MMOL/ML IV SOLN

[Series 2: ax 3(person_name) pre · axial · non-contrast · 3.0mm · 0.94mm/px · z∈[-65,+67]mm · 11 of 48 slices shown]
[im 1/48]
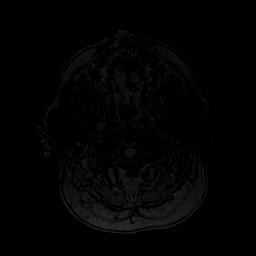
[im 3/48]
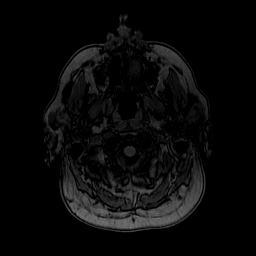
[im 6/48]
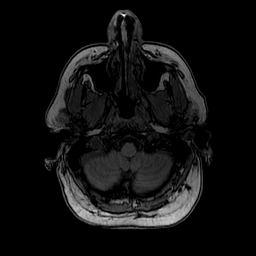
[im 8/48]
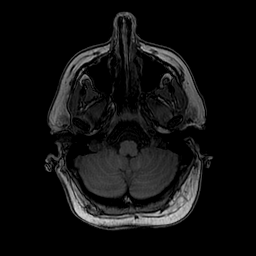
[im 14/48]
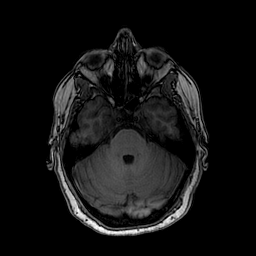
[im 21/48]
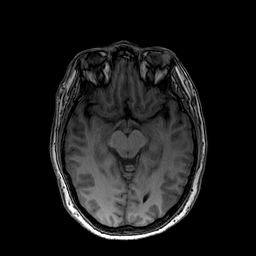
[im 24/48]
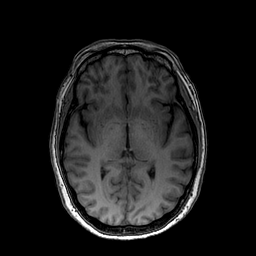
[im 27/48]
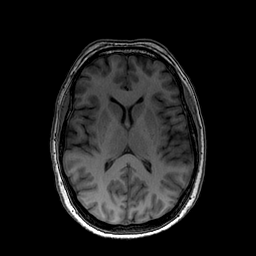
[im 34/48]
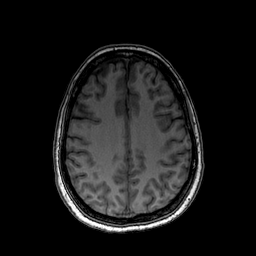
[im 40/48]
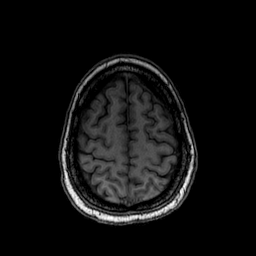
[im 45/48]
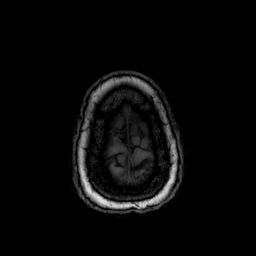

[Series 12: T1 · axial · 3.0mm · 0.94mm/px · z∈[-65,+76]mm · 18 of 48 slices shown (1 of 2)]
[im 1/48]
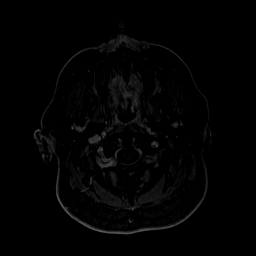
[im 3/48]
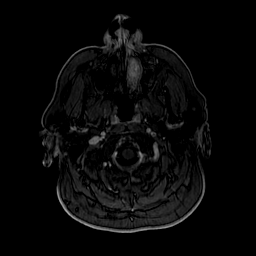
[im 6/48]
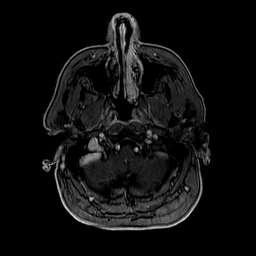
[im 9/48]
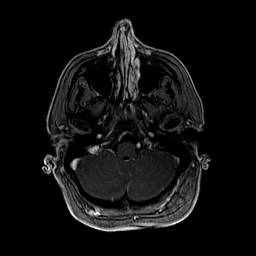
[im 12/48]
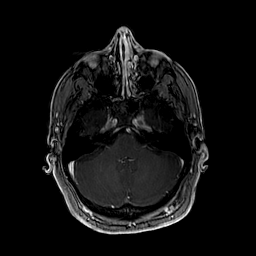
[im 14/48]
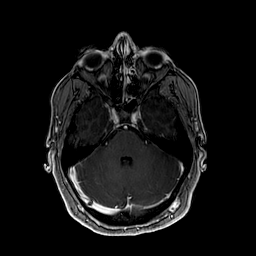
[im 17/48]
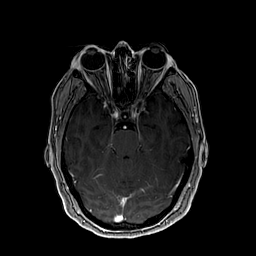
[im 20/48]
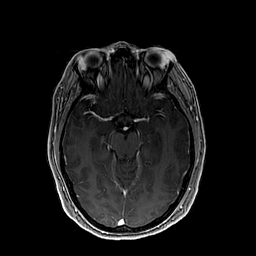
[im 23/48]
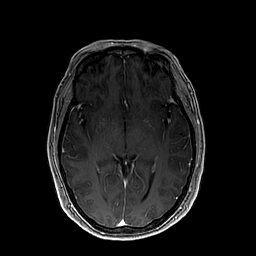
[im 25/48]
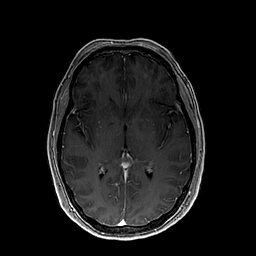
[im 28/48]
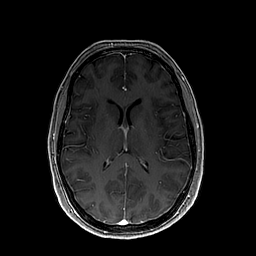
[im 31/48]
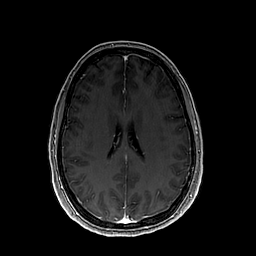
[im 34/48]
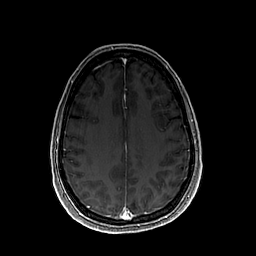
[im 36/48]
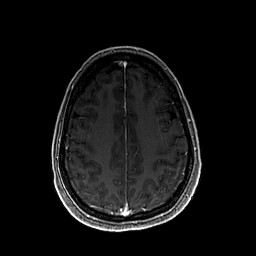
[im 39/48]
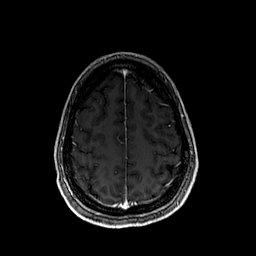
[im 42/48]
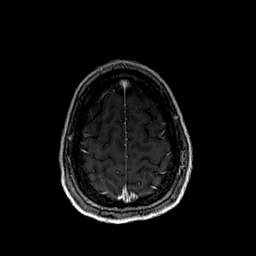
[im 45/48]
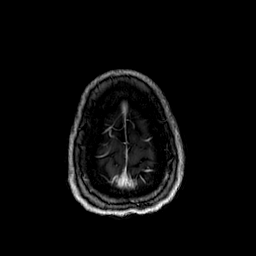
[im 48/48]
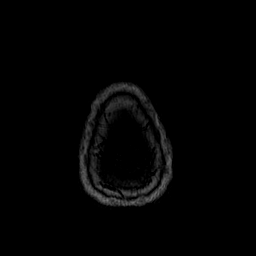

[Series 13: T1 · coronal · 5.0mm · 0.43mm/px · 11 of 29 slices shown (2 of 2)]
[im 1/29]
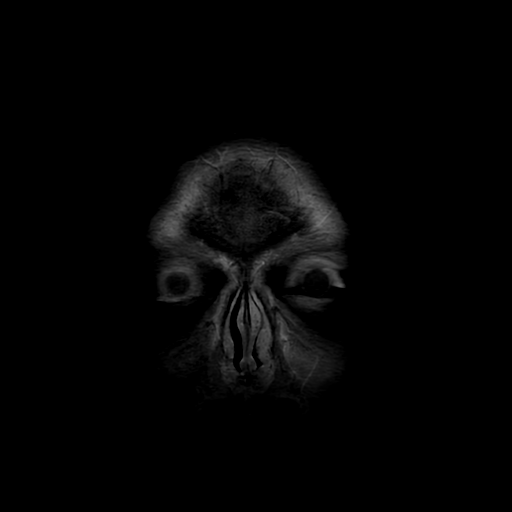
[im 3/29]
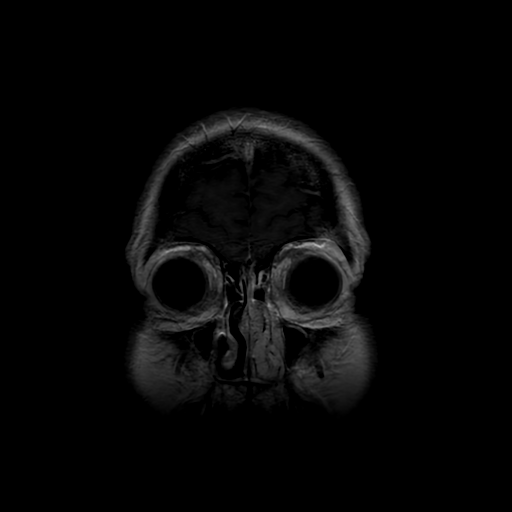
[im 6/29]
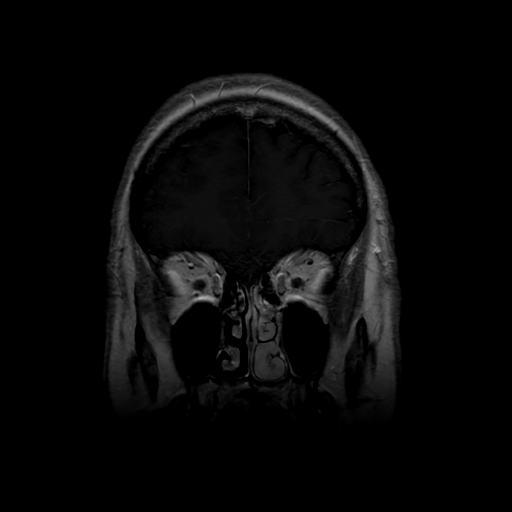
[im 9/29]
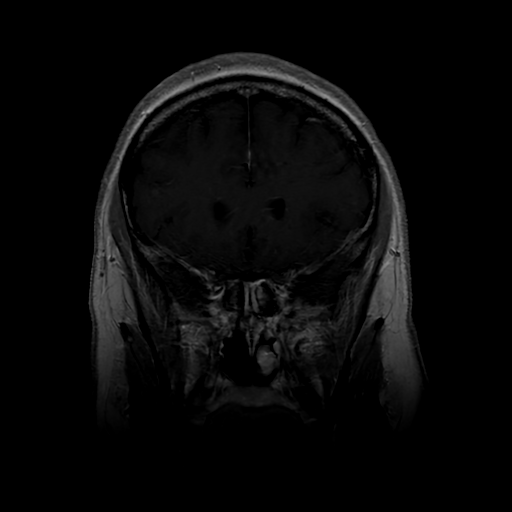
[im 12/29]
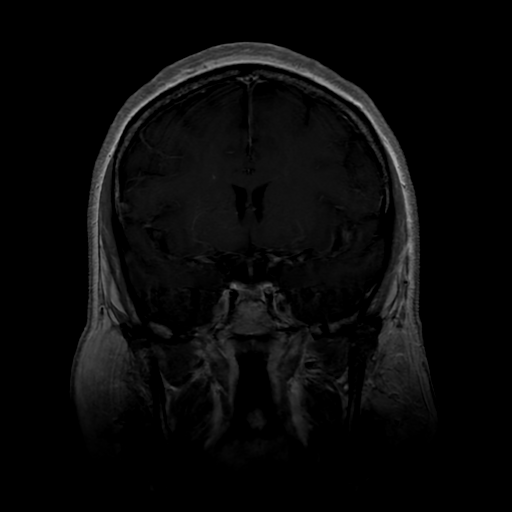
[im 15/29]
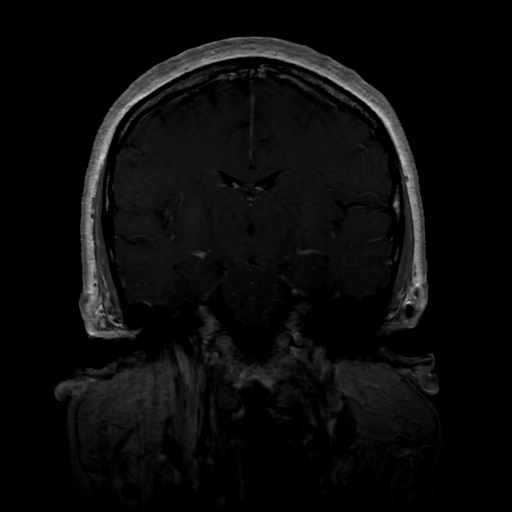
[im 17/29]
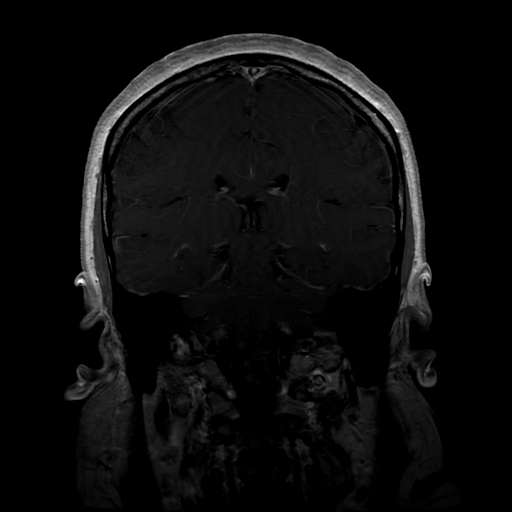
[im 20/29]
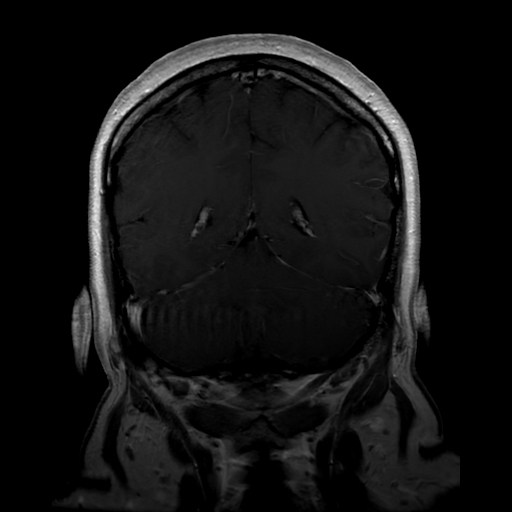
[im 23/29]
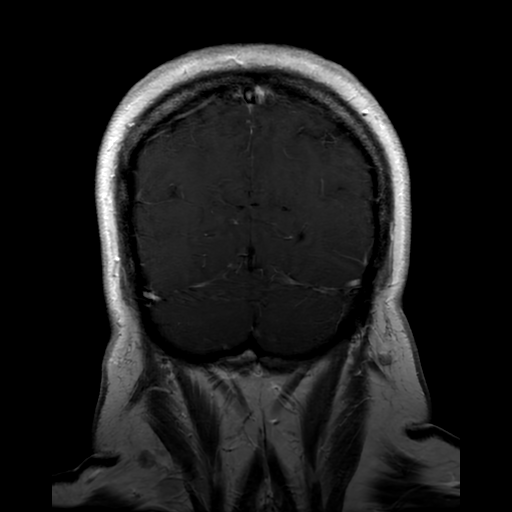
[im 26/29]
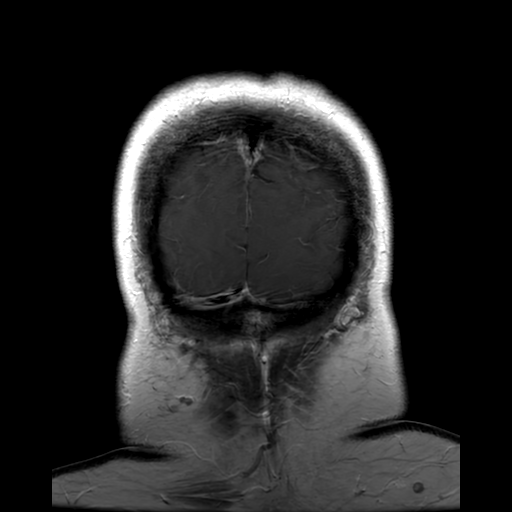
[im 29/29]
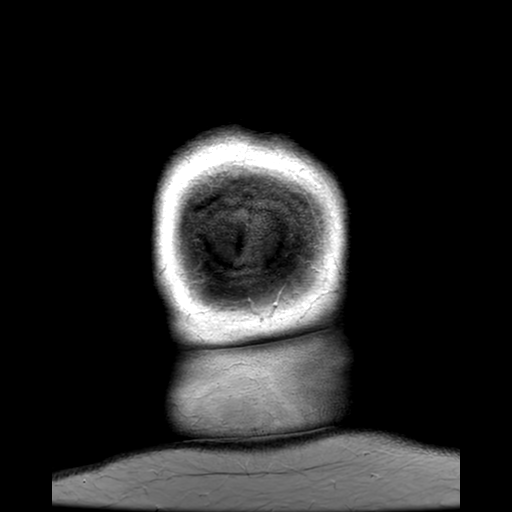

[40 of 48 positions shown; findings below may reference images not displayed]

FINDINGS: Brain: No acute hemorrhage, mass, mass effect, or midline shift. No
hydrocephalus or extra-axial collection. No abnormal parenchymal or
meningeal enhancement.

Vascular: Please see same-day MRA.

Skull and upper cervical spine: Normal marrow signal. No abnormal
enhancement.

Sinuses/Orbits: Normal mucosal enhancement in the sinuses. Orbits
are unremarkable.

Other: None.
IMPRESSION: No abnormal parenchymal, meningeal, or osseous enhancement.

## 2022-02-14 IMAGING — CT CT HEAD W/O CM
4 series · 17 of 47 positions shown, 19 images · non-contrast
Comparison: None.

CLINICAL DATA: Woke up with facial droop and numbness to the left
face.



[Series 3: head wo · axial · 0.44mm/px · z∈[-200,-80]mm · 7 of 34 slices shown, 9 images]
[im 5/34  brain]
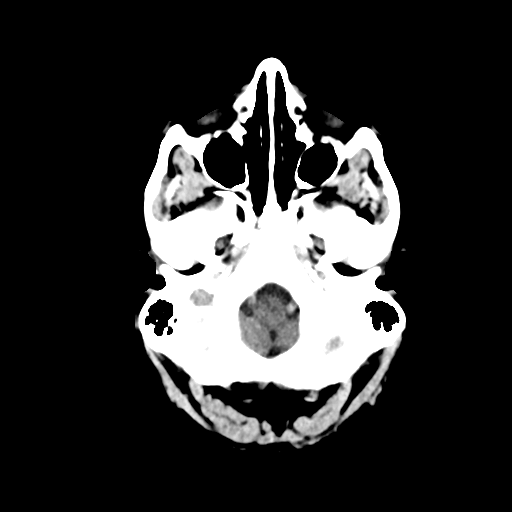
[im 5/34  bone]
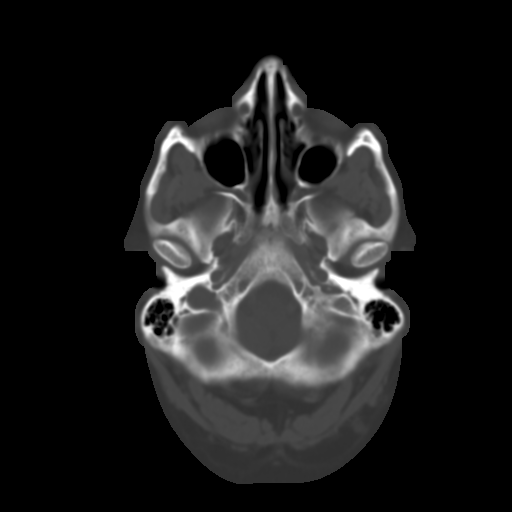
[im 9/34  brain]
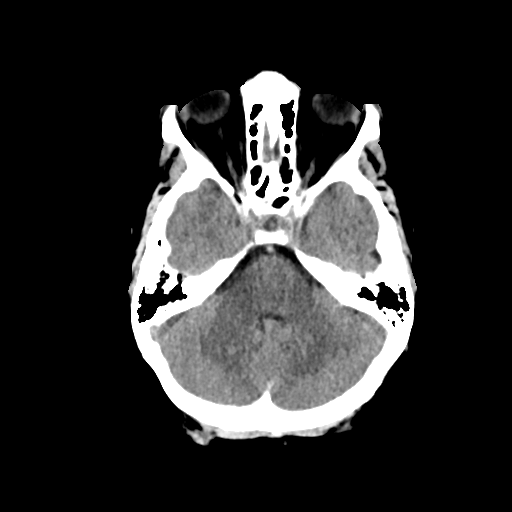
[im 13/34  brain]
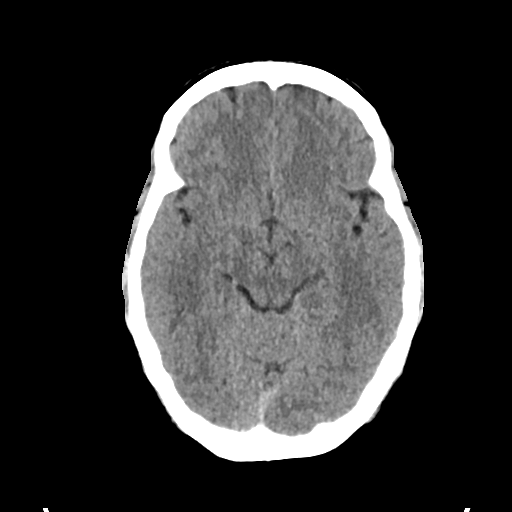
[im 17/34  brain]
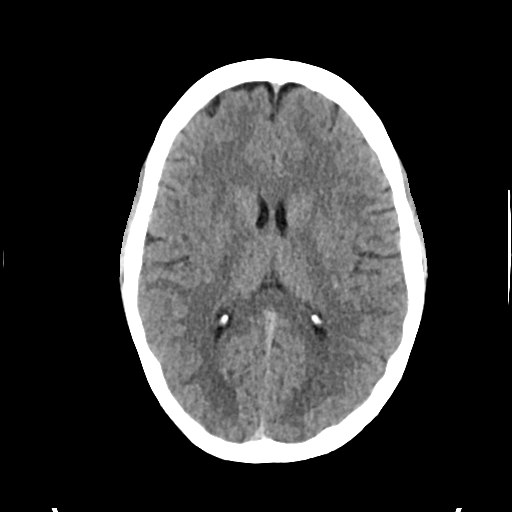
[im 21/34  brain]
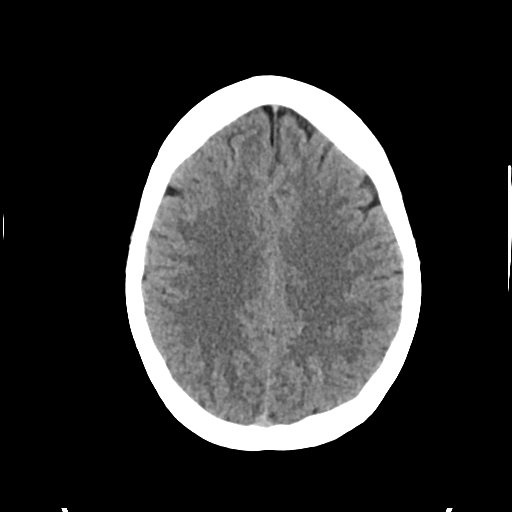
[im 21/34  bone]
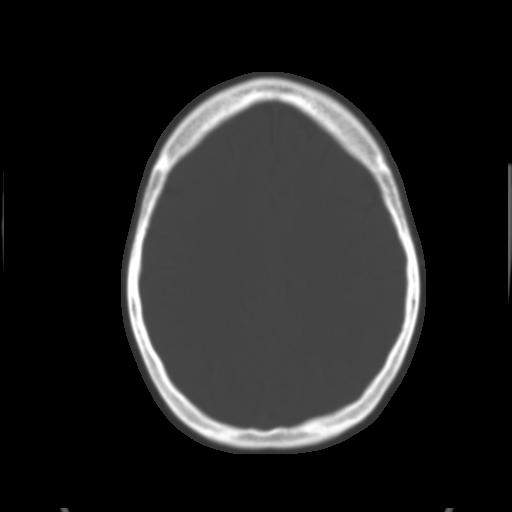
[im 25/34  brain]
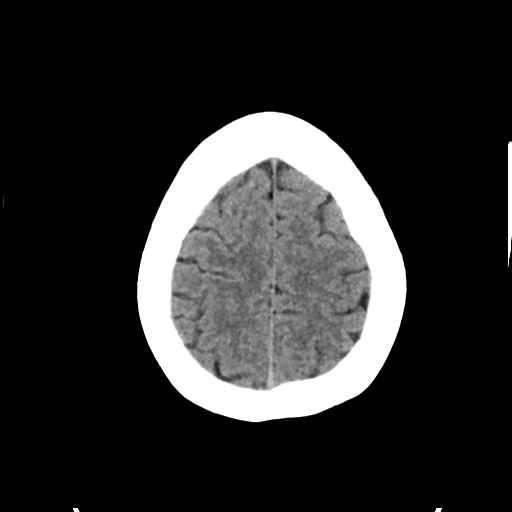
[im 29/34  brain]
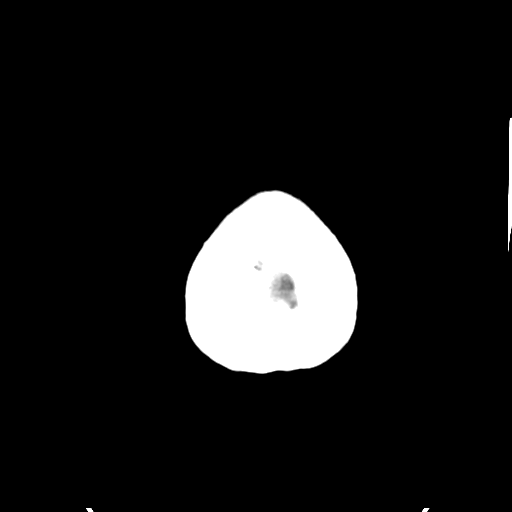

[Series 4: head bone · axial · 0.44mm/px · z∈[-204,-146]mm · 4 of 84 slices shown]
[im 9/84  bone]
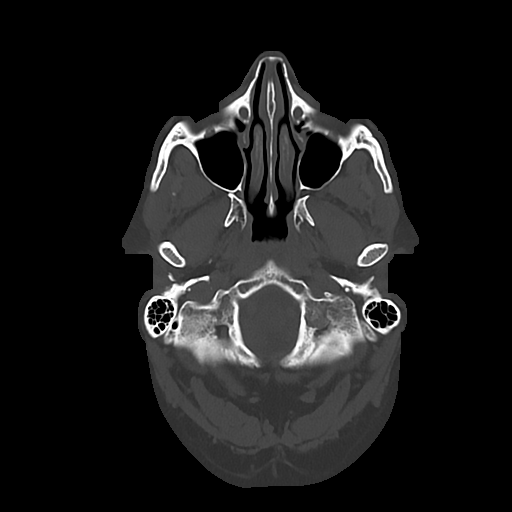
[im 17/84  bone]
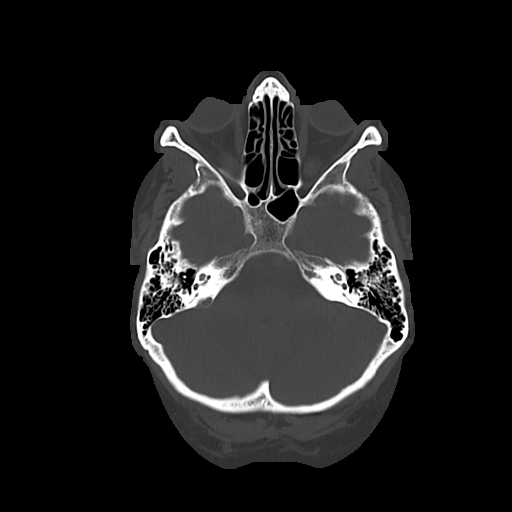
[im 25/84  bone]
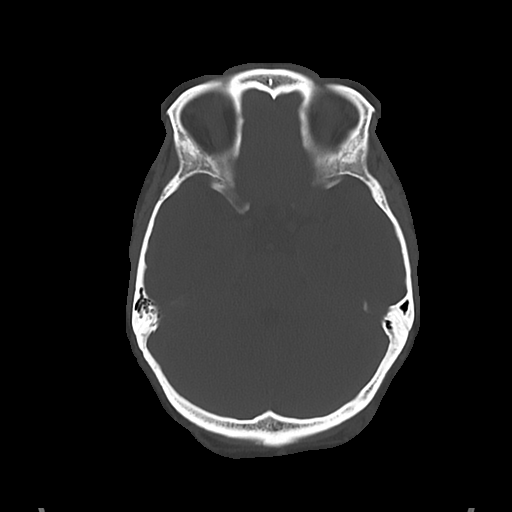
[im 38/84  bone]
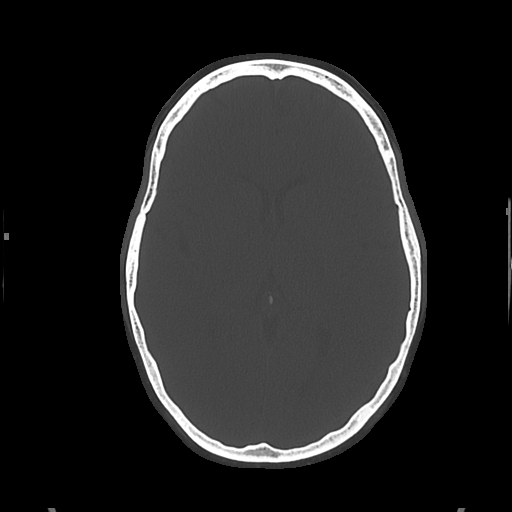

[Series 5: cor soft · coronal · 0.33mm/px · 3 of 70 slices shown]
[im 24/70  brain]
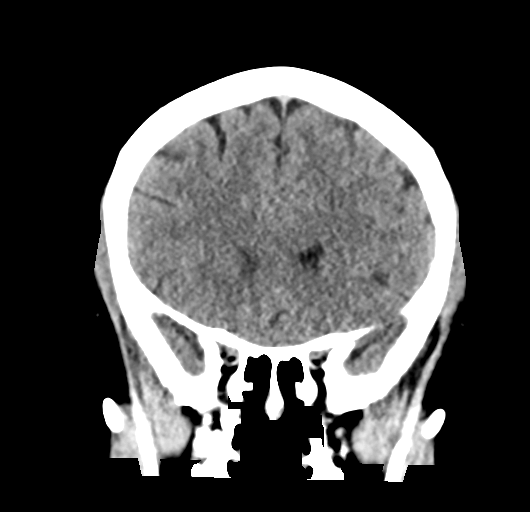
[im 31/70  brain]
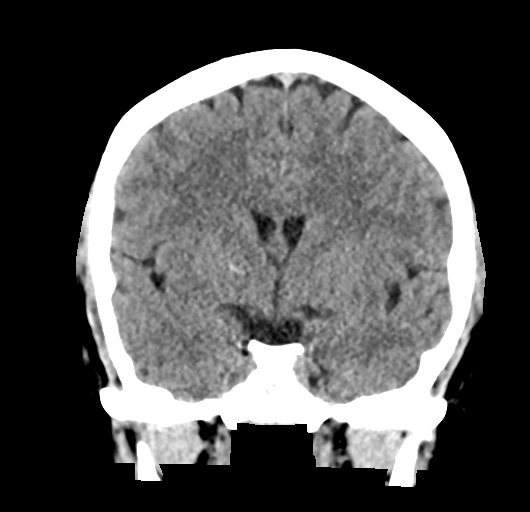
[im 39/70  brain]
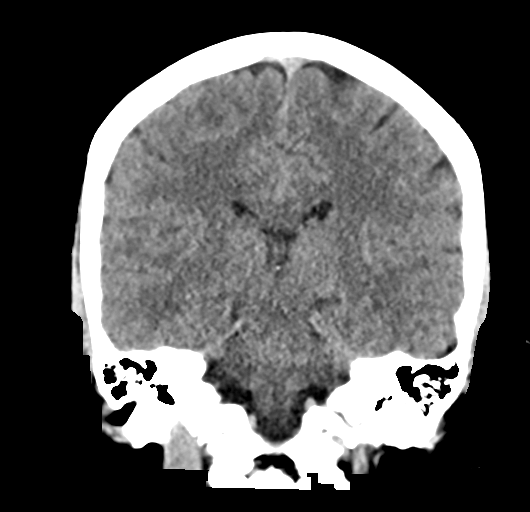

[Series 6: sag soft · sagittal · 0.34mm/px · 3 of 58 slices shown]
[im 20/58  brain]
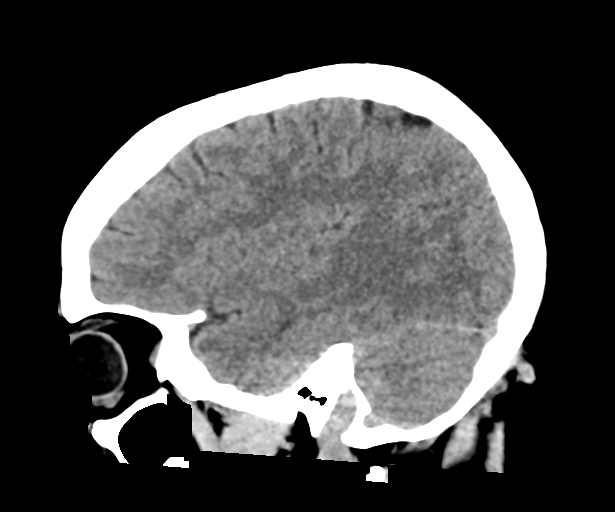
[im 29/58  brain]
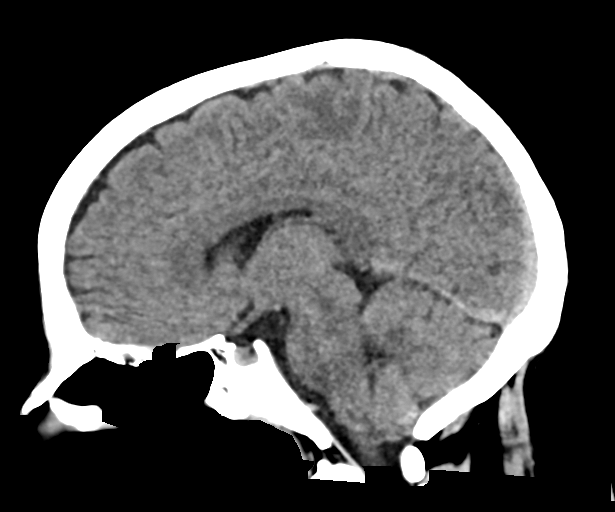
[im 39/58  brain]
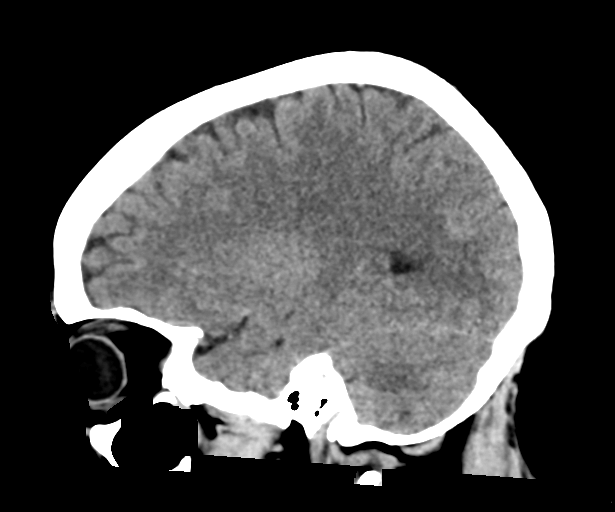

[17 of 47 positions shown; findings below may reference images not displayed]

FINDINGS: Brain: No evidence of acute infarction, hemorrhage, hydrocephalus,
extra-axial collection or mass lesion/mass effect.

Vascular: No hyperdense vessel or unexpected calcification.

Skull: Normal. Negative for fracture or focal lesion.

Sinuses/Orbits: No acute finding.
IMPRESSION: Negative head CT.

## 2022-02-14 MED ORDER — LOSARTAN POTASSIUM 25 MG PO TABS
25.0000 mg | ORAL_TABLET | Freq: Every day | ORAL | Status: DC
  Administered 2022-02-15: 25 mg via ORAL
  Filled 2022-02-14: qty 1

## 2022-02-14 MED ORDER — ACETAMINOPHEN 650 MG RE SUPP: 650.0000 mg | RECTAL | Status: DC | PRN

## 2022-02-14 MED ORDER — MONTELUKAST SODIUM 10 MG PO TABS
10.0000 mg | ORAL_TABLET | Freq: Every day | ORAL | Status: DC
  Administered 2022-02-14: 10 mg via ORAL
  Filled 2022-02-14: qty 1

## 2022-02-14 MED ORDER — PREDNISONE 20 MG PO TABS
60.0000 mg | ORAL_TABLET | Freq: Every day | ORAL | Status: DC
  Administered 2022-02-15: 60 mg via ORAL
  Filled 2022-02-14: qty 3

## 2022-02-14 MED ORDER — LORAZEPAM 1 MG PO TABS
1.0000 mg | ORAL_TABLET | ORAL | Status: AC | PRN
  Administered 2022-02-14: 1 mg via ORAL
  Filled 2022-02-14: qty 1

## 2022-02-14 MED ORDER — MOMETASONE FURO-FORMOTEROL FUM 100-5 MCG/ACT IN AERO
2.0000 | INHALATION_SPRAY | Freq: Two times a day (BID) | RESPIRATORY_TRACT | Status: DC
  Administered 2022-02-15: 2 via RESPIRATORY_TRACT
  Filled 2022-02-14: qty 8.8

## 2022-02-14 MED ORDER — PANTOPRAZOLE SODIUM 40 MG PO TBEC
40.0000 mg | DELAYED_RELEASE_TABLET | Freq: Every day | ORAL | Status: DC
  Administered 2022-02-15: 40 mg via ORAL
  Filled 2022-02-14: qty 1

## 2022-02-14 MED ORDER — GADOBUTROL 1 MMOL/ML IV SOLN
10.0000 mL | Freq: Once | INTRAVENOUS | Status: AC | PRN
  Administered 2022-02-14: 10 mL via INTRAVENOUS

## 2022-02-14 MED ORDER — FOSFOMYCIN TROMETHAMINE 3 G PO PACK
3.0000 g | PACK | Freq: Once | ORAL | Status: AC
  Administered 2022-02-15: 3 g via ORAL
  Filled 2022-02-14: qty 3

## 2022-02-14 MED ORDER — ENOXAPARIN SODIUM 60 MG/0.6ML IJ SOSY
45.0000 mg | PREFILLED_SYRINGE | INTRAMUSCULAR | Status: DC
  Administered 2022-02-14: 45 mg via SUBCUTANEOUS
  Filled 2022-02-14: qty 0.6
  Filled 2022-02-14: qty 0.45

## 2022-02-14 MED ORDER — ENOXAPARIN SODIUM 40 MG/0.4ML IJ SOSY: 40.0000 mg | PREFILLED_SYRINGE | INTRAMUSCULAR | Status: DC

## 2022-02-14 MED ORDER — ACETAMINOPHEN 160 MG/5ML PO SOLN: 650.0000 mg | ORAL | Status: DC | PRN

## 2022-02-14 MED ORDER — STROKE: EARLY STAGES OF RECOVERY BOOK
Freq: Once | Status: AC
  Filled 2022-02-14: qty 1

## 2022-02-14 MED ORDER — BUDESON-GLYCOPYRROL-FORMOTEROL 160-9-4.8 MCG/ACT IN AERO: 2.0000 | INHALATION_SPRAY | Freq: Two times a day (BID) | RESPIRATORY_TRACT | Status: DC

## 2022-02-14 MED ORDER — PRAVASTATIN SODIUM 40 MG PO TABS: 20.0000 mg | ORAL_TABLET | Freq: Every day | ORAL | Status: DC

## 2022-02-14 MED ORDER — ACETAMINOPHEN 325 MG PO TABS: 650.0000 mg | ORAL_TABLET | ORAL | Status: DC | PRN

## 2022-02-14 MED ORDER — LORATADINE 10 MG PO TABS
10.0000 mg | ORAL_TABLET | Freq: Every day | ORAL | Status: DC
  Administered 2022-02-15: 10 mg via ORAL
  Filled 2022-02-14: qty 1

## 2022-02-14 MED ORDER — VENLAFAXINE HCL ER 75 MG PO CP24
75.0000 mg | ORAL_CAPSULE | Freq: Every day | ORAL | Status: DC
  Administered 2022-02-15: 75 mg via ORAL
  Filled 2022-02-14: qty 1

## 2022-02-14 MED ORDER — OMEPRAZOLE MAGNESIUM 20 MG PO TBEC: 20.0000 mg | DELAYED_RELEASE_TABLET | Freq: Every day | ORAL | Status: DC

## 2022-02-14 MED ORDER — SODIUM CHLORIDE 0.9 % IV SOLN: INTRAVENOUS | Status: DC

## 2022-02-14 MED ORDER — LORAZEPAM 2 MG/ML IJ SOLN
1.0000 mg | Freq: Once | INTRAMUSCULAR | Status: AC | PRN
  Administered 2022-02-14: 1 mg via INTRAVENOUS
  Filled 2022-02-14: qty 1

## 2022-02-14 MED ORDER — UMECLIDINIUM BROMIDE 62.5 MCG/ACT IN AEPB
1.0000 | INHALATION_SPRAY | Freq: Every day | RESPIRATORY_TRACT | Status: DC
  Administered 2022-02-15: 1 via RESPIRATORY_TRACT
  Filled 2022-02-14: qty 7

## 2022-02-14 NOTE — H&P (Addendum)
?History and Physical  ? ? ?Patient: Terri Richardson [id number] DOB: [birth year] ?DOA: 02/14/2022 ?DOS: the patient was seen and examined on 02/14/2022 ?PCP: Martinique, Betty G, MD  ?Patient coming from: Home ? ?Chief Complaint:  ?Chief Complaint  ?Patient presents with  ? Numbness  ? ?HPI: Terri Richardson is a 51 y.o. female with medical history significant of hypertension, diabetes mellitus type 2, asthma, depression, and GERD who presents with complaints of left-sided facial numbness that started this morning when she woke up.  She noticed that the left side of her mouth was drooping as well when she tried to take a drink.  Patient was last noted to be normal around 9 PM last night prior to going to sleep.  Yesterday, she had been dealing with a headache that she thought was related to her allergies which is not usual.  She had taken ibuprofen to try and help with symptoms.  Denies having any changes in her vision, slurred speech, fever, nausea, vomiting, dysuria, or diarrhea.  She does feel like the left side of her body is not as sensitive to touch as the right. ? ?Upon admission into the emergency department patient was seen to be afebrile with blood pressures elevated up to 181/130, and all other vital signs maintained.  Labs significant for sodium 134 and alcohol level undetectable.  CT scan of the head did not note any acute abnormality.  Patient was out of the window for thrombolytics.  UDS was negative.  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  Urinalysis noted large leukocytes, many bacteria, 11-20 squamous epithelial cells, and 21-50 WBC cells. ? ?Review of Systems: As mentioned in the history of present illness. All other systems reviewed and are negative. ?Past Medical History:  ?Diagnosis Date  ? Allergy   ? Asthma   ? Depression   ? Diabetes mellitus 2006  ? GERD (gastroesophageal reflux disease)   ? Hypertension    ? ?Past Surgical History:  ?Procedure Laterality Date  ? APPENDECTOMY  1st grade  ? CESAREAN SECTION  2008  ? CHOLECYSTECTOMY  11/11/2011  ? Procedure: LAPAROSCOPIC CHOLECYSTECTOMY WITH INTRAOPERATIVE CHOLANGIOGRAM;  Surgeon: Belva Crome, MD;  Location: [place];  Service: General;  Laterality: N/A;  ? TONSILLECTOMY  6th grade  ? ?Social History:  reports that she quit smoking about 7 years ago. Her smoking use included cigarettes. She has a 2.00 pack-year smoking history. She has never used smokeless tobacco. She reports that she does not drink alcohol and does not use drugs. ? ?Allergies  ?Allergen Reactions  ? Other Other (See Comments)  ?  Catgut stitches causes water blisters  ? ? ?Family History  ?Problem Relation Age of Onset  ? Hyperlipidemia Mother   ? Hypertension Father   ? Tongue cancer Maternal Grandfather   ? Prostate cancer Paternal Uncle   ? Kidney cancer Maternal Uncle   ? Heart disease Brother   ? Hypertension Brother   ? Eczema Son   ? Allergic rhinitis Neg Hx   ? Angioedema Neg Hx   ? Urticaria Neg Hx   ? ? ?Prior to Admission medications   ?Medication Sig Start Date End Date Taking? Authorizing Provider  ?albuterol (VENTOLIN HFA) 108 (90 Base) MCG/ACT inhaler Inhale 2 puffs into the lungs every 6 (six) hours as needed for wheezing or shortness of breath. 03/10/20   Martinique, Betty G, MD  ?  Budeson-Glycopyrrol-Formoterol (BREZTRI AEROSPHERE) 160-9-4.8 MCG/ACT AERO Inhale 2 puffs into the lungs in the morning and at bedtime. 06/29/21   Valentina Shaggy, MD  ?FASENRA PEN 30 MG/ML SOAJ INJECT 1 PEN UNDER THE SKIN AT WEEK 0, 4 AND 8, THEN INJECT 1 PEN EVERY 8 WEEKS THEREAFTER. ?Patient taking differently: Inject 1 pen under the skin at week 0, 4 and 8, then inject 1 pen every 8 weeks thereafter. 05/09/21   Valentina Shaggy, MD  ?fluticasone (FLONASE) 50 MCG/ACT nasal spray SPRAY 2 SPRAYS INTO EACH NOSTRIL EVERY DAY ?Patient taking differently: Place 2 sprays into both nostrils daily. 01/05/22    Valentina Shaggy, MD  ?ibuprofen (ADVIL,MOTRIN) 600 MG tablet Take 1 tablet (600 mg total) by mouth every 6 (six) hours as needed. 04/26/16   Leo Grosser, MD  ?loratadine (CLARITIN) 10 MG tablet TAKE 1 TABLET BY MOUTH EVERY DAY ?Patient taking differently: Take 10 mg by mouth daily. 01/30/22   Valentina Shaggy, MD  ?losartan (COZAAR) 25 MG tablet TAKE 1 TABLET (25 MG TOTAL) BY MOUTH DAILY. 10/28/21   Martinique, Betty G, MD  ?montelukast (SINGULAIR) 10 MG tablet TAKE 1 TABLET BY MOUTH EVERYDAY AT BEDTIME; NEEDS APPT FOR REFILLS ?Patient taking differently: Take 10 mg by mouth at bedtime. TAKE 1 TABLET BY MOUTH EVERYDAY AT BEDTIME; NEEDS APPT FOR REFILLS 01/05/22   Martinique, Betty G, MD  ?omeprazole (PRILOSEC) 20 MG capsule Take 1 capsule (20 mg total) by mouth daily. 08/23/16   Delano Metz, FNP  ?pravastatin (PRAVACHOL) 20 MG tablet TAKE 1 TABLET BY MOUTH EVERY DAY ?Patient taking differently: Take 20 mg by mouth daily. 10/28/21   Martinique, Betty G, MD  ?venlafaxine XR (EFFEXOR-XR) 75 MG 24 hr capsule Take 1 capsule (75 mg total) by mouth daily with breakfast. NEED APPT FOR REFILLS 01/05/22   Martinique, Betty G, MD  ? ? ?Physical Exam: ?Vitals:  ? 02/14/22 1230 02/14/22 1245 02/14/22 1254 02/14/22 1501  ?BP: (!) 154/108 (!) 153/108  (!) 155/115  ?Pulse: 81 69 77 100  ?Resp: 13 12 14 16   ?Temp:      ?SpO2: 97% 98% 97% 99%  ?Weight:      ?Height:      ? ?Exam ? ?Constitutional: NAD, calm, comfortable ?Eyes: PERRL, ptosis noted of the left eye, and patient blinks less frequently under the left eye ?ENMT: Mucous membranes are moist. ?Neck: normal, supple, no masses, no thyromegaly ?Respiratory: clear to auscultation bilaterally, no wheezing, no crackles. Normal respiratory effort. No accessory muscle use.  ?Cardiovascular: Regular rate and rhythm, no murmurs / rubs / gallops. ? ?Musculoskeletal: no clubbing / cyanosis. No joint deformity upper and lower extremities. Good ROM, no contractures. Normal muscle tone.  ?Skin: no  rashes, lesions, ulcers.  ?Neurologic: CN 2-12 grossly intact.  Left-sided facial droop.  Patient reports decreased sensation on the left when compared to the right.  Otherwise able to move all extremities. ?Psychiatric: Normal judgment and insight. Alert and oriented x 3. Normal mood.  ? ?Data Reviewed: ? ?Reviewed imaging and data as noted above in HPI ? ?Assessment and Plan: ?Left-sided facial droop and numbness ?Patient presents after waking up this morning with left-sided facial droop and complaints of left-sided numbness.  MRI of the brain did not note any signs of a stroke.  Question possibility of Bell's palsy. ?-Admit to a telemetry bed ?-Neurochecks ?-Follow-up MRI with contrast and MRI with and without contrast of the cervical spine ?-Will start prednisone 60 mg p.o. daily if  imaging studies negative to treat for possibility of Bell's palsy ?-Appreciate neurology consultative services we will follow-up for any further recommendations ? ?Abnormal urinalysis ?Patient denied any complaints of dysuria.  Urinalysis was positive for small hemoglobin, large leukocytes, many bacteria, 11-20 squamous epithelial cells, and 21-50 WBCs.  This was thought to be possibly a contaminant. ?-Check urine culture ?-Fosfomycin in case of possible UTI x1 dose due to possibly needing to start steroids ? ?Hyponatremia ?Acute.  Sodium level was just mildly low at 134. ?-Recheck BMP in a.m. ? ?Essential hypertension ?Home medication regimen includes losartan 25 mg daily. ?-Continue losartan ? ?Hyperlipidemia ?Lipid panel revealed total cholesterol 151, HDL 50, LDL 80, and triglycerides 103 ?-Continue pravastatin ? ?Anxiety and depression  ?-Continue venlafaxine ? ?Asthmaseasonal allergies ?-Continue Singulair and Claritin ? ?History of diabetes ?Patient is not on any medications for diabetes.  Hemoglobin A1c 6.3 ? 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 ? Advance Care Planning:   Code Status: Full Code  ? ?Consults: Neurology ? ?Family Communication: Husband updated at bedside ? ?Severity of Illness: ?The app

## 2022-02-14 NOTE — ED Provider Triage Note (Addendum)
Emergency Medicine Provider Triage Evaluation Note ? ?Terri Richardson , a 51 y.o. female  was evaluated in triage.  Pt complains of left-sided numbness and facial droop.  Patient reports not feeling well over the last several days and having been in bed most of the day yesterday.  She states she did feel an odd sensation in her tongue yesterday which she attributed to the thing that she was eating and so she stopped eating that food.  She states that she went to bed around 9 PM and woke up this morning and noticed that her left side of the face had decreased sensation, she is difficulty closing her left eye and she noted that her left side of her face is drooping.  She denies any dysarthria.  Denies any numbness in her upper or lower extremities.  Denies any dizziness.  She states she does have a headache, states that her vision out of her left eye is decreased secondary to having difficulty closing her left eye.  She reports a history of hyperlipidemia and diabetes. ?Review of Systems  ?Positive: As above ?Negative: As above ? ?Physical Exam  ?Pulse (!) 101   Temp (!) 97.2 ?F (36.2 ?C)   Resp 18   SpO2 99%  ?Gen:   Awake, no distress   ?Resp:  Normal effort  ?MSK:   Moves extremities without difficulty  ?Other:  Mental Status:  ?Alert, thought content appropriate, able to give a coherent history. Speech fluent without evidence of aphasia. Able to follow 2 step commands without difficulty.  ?Cranial Nerves:  ?II:  Peripheral visual fields grossly normal, pupils equal, round, reactive to light ?III,IV, VI:  left eye ptosis present, extra-ocular motions intact bilaterally  ?V,VII: smile asymmetric, notable left facial droop, unable to close left eye completely, patient reports decreased sensation to the left side of her face.  Unable to frow left eyebrow ?VIII: hearing grossly normal to voice  ?X: uvula elevates symmetrically  ?XI: bilateral shoulder shrug symmetric and strong ?XII: midline tongue extension  without fassiculations ?Motor:  ?Normal tone. 5/5 strength of BUE and BLE major muscle groups including strong and equal grip strength and dorsiflexion/plantar flexion ?Sensory: light touch normal in all extremities. ?Cerebellar: normal finger-to-nose with bilateral upper extremities, Romberg sign absent ?Gait: not accessed  ? ? ?Medical Decision Making  ?Medically screening exam initiated at 5:36 AM.  Appropriate orders placed.  Anjolie Majer was informed that the remainder of the evaluation will be completed by another provider, this initial triage assessment does not replace that evaluation, and the importance of remaining in the ED until their evaluation is complete. ? ? ?  ?Mare Ferrari, PA-C ?02/14/22 [id number] ? ?  ?Mare Ferrari, PA-C ?02/14/22 [id number] ? ?

## 2022-02-14 NOTE — ED Notes (Signed)
Pt transported to MRI 

## 2022-02-14 NOTE — ED Notes (Signed)
Attempted IV start x2.

## 2022-02-14 NOTE — ED Triage Notes (Addendum)
PT LSW 2100 02/13/2022. Pt woke up this morning with left sided facial droop and face numbness. Denies other symptoms or pain ?

## 2022-02-14 NOTE — ED Notes (Signed)
Assumed pt care at this time

## 2022-02-14 NOTE — ED Notes (Signed)
Pt ambulated to bathroom to provide urine sample

## 2022-02-14 NOTE — Consult Note (Addendum)
Neurology Consultation ?Reason for Consult: Left facial droop ?Requesting Physician: Regan Lemming ? ?CC: Left facial droop and numbness ? ?History is obtained from: Patient ? ?HPI: Terri Richardson is a 51 y.o. female with past medical history of primary hypertension, type 2 diabetes, obesity and asthma who presented to [facility] emergency room on 02/14/2022 with chief complaint of left facial droop and numbness. Patient reports she had a headache all day yesterday, denies history of migraine headache only takes ibuprofen for headaches (but reports that this is her typical sinus headache) and went to bed around 9pm and when she woke up this morning she noticed it was taking more effort to close her left eye when she blinks, but went about getting ready for work and also noticed left-sided facial numbness.Per patient, when she tried to take a drink she noticed that the water was pouring out of the left side of her mouth.  That was when she decided to come to the emergency room for evaluation.  Denies any fever, change in taste bud, pain behind her left ear,chills, word finding, slurred speech or any other neurological deficits. ? ?While in the ED she additionally developed reduced sensation in the left arm and leg ? ?LKW: 02/13/2022 at 9pm ?tPA given?: No, outside the time window for IV tPA ?IA performed?: No,  ?Premorbid modified rankin scale:  ?    0 - No symptoms. ?     ? ?ROS: All other review of systems was negative except as noted in the HPI ? ?Past Medical History:  ?Diagnosis Date  ? Allergy   ? Asthma   ? Depression   ? Diabetes mellitus 2006  ? GERD (gastroesophageal reflux disease)   ? Hypertension   ? ?Past Surgical History:  ?Procedure Laterality Date  ? APPENDECTOMY  1st grade  ? CESAREAN SECTION  2008  ? CHOLECYSTECTOMY  11/11/2011  ? Procedure: LAPAROSCOPIC CHOLECYSTECTOMY WITH INTRAOPERATIVE CHOLANGIOGRAM;  Surgeon: Belva Crome, MD;  Location: [place];  Service: General;  Laterality: N/A;   ? TONSILLECTOMY  6th grade  ? ?Current Outpatient Medications  ?Medication Instructions  ? albuterol (VENTOLIN HFA) 108 (90 Base) MCG/ACT inhaler 2 puffs, Inhalation, Every 6 hours PRN  ? Budeson-Glycopyrrol-Formoterol (BREZTRI AEROSPHERE) 160-9-4.8 MCG/ACT AERO 2 puffs, Inhalation, 2 times daily  ? FASENRA PEN 30 MG/ML SOAJ INJECT 1 PEN UNDER THE SKIN AT WEEK 0, 4 AND 8, THEN INJECT 1 PEN EVERY 8 WEEKS THEREAFTER.  ? fluticasone (FLONASE) 50 MCG/ACT nasal spray SPRAY 2 SPRAYS INTO EACH NOSTRIL EVERY DAY  ? ibuprofen (ADVIL) 600 mg, Oral, Every 6 hours PRN  ? loratadine (CLARITIN) 10 MG tablet TAKE 1 TABLET BY MOUTH EVERY DAY  ? losartan (COZAAR) 25 mg, Oral, Daily  ? montelukast (SINGULAIR) 10 MG tablet TAKE 1 TABLET BY MOUTH EVERYDAY AT BEDTIME; NEEDS APPT FOR REFILLS  ? omeprazole (PRILOSEC) 20 mg, Oral, Daily  ? pravastatin (PRAVACHOL) 20 MG tablet TAKE 1 TABLET BY MOUTH EVERY DAY  ? venlafaxine XR (EFFEXOR-XR) 75 mg, Oral, Daily with breakfast, NEED APPT FOR REFILLS  ? ? ? ?Family History  ?Problem Relation Age of Onset  ? Hyperlipidemia Mother   ? Hypertension Father   ? Tongue cancer Maternal Grandfather   ? Prostate cancer Paternal Uncle   ? Kidney cancer Maternal Uncle   ? Heart disease Brother   ? Hypertension Brother   ? Eczema Son   ? Allergic rhinitis Neg Hx   ? Angioedema Neg Hx   ?  Urticaria Neg Hx   ? ? ? ?Social History:  reports that she quit smoking about 7 years ago. Her smoking use included cigarettes. She has a 2.00 pack-year smoking history. She has never used smokeless tobacco. She reports that she does not drink alcohol and does not use drugs. ? ? ?Exam: ?Current vital signs: ?BP (!) 145/99   Pulse 74   Temp (!) 97.2 ?F (36.2 ?C)   Resp 12   Ht 5\' 2"  (1.575 m)   Wt 93.4 kg   LMP 02/06/2022   SpO2 95%   BMI 37.66 kg/m?  ?Vital signs in last 24 hours: ?Temp:  [97.2 ?F (36.2 ?C)] 97.2 ?F (36.2 ?C) (04/25 0535) ?Pulse Rate:  [71-101] 74 (04/25 1030) ?Resp:  [11-20] 12 (04/25 1030) ?BP:  (144-181)/(99-130) 145/99 (04/25 1030) ?SpO2:  [95 %-99 %] 95 % (04/25 1030) ?Weight:  [93.4 kg] 93.4 kg (04/25 0536) ? ? ?Physical Exam  ?Constitutional: Appears well-developed and well-nourished.  ?Psych: Affect appropriate to situation, calm and cooperative ?Eyes: No scleral injection, noticeable decreased blink rate of the left eye with widened palpebral fissure ?HENT: No oropharyngeal obstruction.  ?MSK: no joint deformities.  ?Cardiovascular: Normal rate and regular rhythm. Perfusing extremities well ?Respiratory: Effort normal, non-labored breathing ?GI: Soft.  No distension. There is no tenderness.  ?Skin: Warm dry and intact visible skin ? ?Neuro: ?Mental Status: ?Patient is awake, alert, oriented to person, place, month, year, and situation. ?Patient is able to give a clear and coherent history. ?No signs of aphasia or neglect ?Cranial Nerves: ?II: Visual Fields are full. Pupils are equal, round, and reactive to light.   ?III,IV, VI: EOMI with left eye ptosis, widened palpebral fissure, reduced blink rate ?V: Facial sensation is less on left side ?VII: Facial movement is asymmetric, drooping of left corner of the mouth,inability to close left eye  and  loss of  left nasolabial fold  ?VIII: hearing is intact to voice ?X: Uvula elevates symmetrically ?XI: Shoulder shrug is symmetric. ?XII: tongue is midline without atrophy or fasciculations.  ?Motor: ?Tone is normal. Bulk is normal. 5/5 strength was present in all four extremities.  ?Sensory: ?Sensation decreased to light touch and temperature left facial, left arm and left leg, she reports about a 20% decrease ?Deep Tendon Reflexes: ?2+ and symmetric in the brachioradialis and patellae.  ?Plantars: ?Toes are downgoing bilaterally.  ?Cerebellar: ?FNF and HKS are intact bilaterally ?Gait:  ?Deferred in acute setting  ? ? ?NIHSS components Score: Comment  ?1a Level of Conscious 0[x]  1[]  2[]  3[]      ?1b LOC Questions 0[x]  1[]  2[]       ?1c LOC Commands 0[x]  1[]   2[]       ?2 Best Gaze 0[x]  1[]  2[]       ?3 Visual 0[x]  1[]  2[]  3[]      ?4 Facial Palsy 0[]  1[x]  2[]  3[]      ?5a Motor Arm - left 0[x]  1[]  2[]  3[]  4[]  UN[]    ?5b Motor Arm - Right 0[x]  1[]  2[]  3[]  4[]  UN[]    ?6a Motor Leg - Left 0[x]  1[]  2[]  3[]  4[]  UN[]    ?6b Motor Leg - Right 0[x]  1[]  2[]  3[]  4[]  UN[]    ?7 Limb Ataxia 0[x]  1[]  2[]  3[]  UN[]     ?8 Sensory 0[]  1[x]  2[]  UN[]      ?9 Best Language 0[x]  1[]  2[]  3[]      ?10 Dysarthria 0[x]  1[]  2[]  UN[]      ?11 Extinct. and Inattention 0[x]  1[]  2[]       ?  TOTAL: 2   ?  ? ? ? ? ?NIHSS total : ?Score breakdown: 2 for sensory loss and left facial paralysis ? ? ?I have reviewed labs in epic and the results pertinent to this consultation are: ? ?  Latest Ref Rng & Units 02/14/2022  ?  8:41 AM 02/14/2022  ?  5:41 AM 02/01/2021  ? 11:50 AM  ?CBC  ?WBC 4.0 - 10.5 K/uL 9.1   9.4   8.8    ?Hemoglobin 12.0 - 15.0 g/dL 14.9   15.2   14.8    ?Hematocrit 36.0 - 46.0 % 43.9   46.1   44.6    ?Platelets 150 - 400 K/uL 305   331   335    ? Lipid Panel  ?   ?Component Value Date/Time  ? CHOL 133 08/04/2019 1001  ? TRIG 100.0 08/04/2019 1001  ? HDL 59.60 08/04/2019 1001  ? CHOLHDL 2 08/04/2019 1001  ? VLDL 20.0 08/04/2019 1001  ? LDLCALC 53 08/04/2019 1001  ? ?Lab Results  ?Component Value Date  ? HGBA1C 6.0 01/25/2021  ?  ? ?I have reviewed the images obtained: ? ?MRI/ MRA Head / Neck without contrast done on 02/14/2022  ?IMPRESSION: ?1. No acute intracranial abnormality. Mild white matter changes ?consistent with chronic microvascular ischemia ?2. No intracranial stenosis or large vessel occlusion. ?3. Negative for vertebral or carotid stenosis in the neck ?4. 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 ? ?Head CT without IV contrast done on 02/14/2022. ?IMPRESSION: ?Negative head CT. ?  ?Assessment : ?51 year old female with stroke risk factors of hypertension type 2 diabetes without hyperglycemia (diet controlled ) and  obesity with BMI of 37.66 presented with left facial numbness and  drooping.reported having headache all day yesterday before going to bed and usually she just takes ibuprofen for headache ,not on prescription medication for headache. ?On my exam patient complained of increasin

## 2022-02-14 NOTE — ED Notes (Signed)
Pt reporting decreased sensation to L arm and leg. Pt notified neurologist of this when Bhagat MD was at bedside. Pt reports this started while in the ED. ?

## 2022-02-14 NOTE — ED Provider Notes (Signed)
?[facility] EMERGENCY DEPARTMENT ?Provider Note ? ? ?CSN: [id number] ?Arrival date & time: 02/14/22  2330 ? ?  ? ?History ? ?Chief Complaint  ?Patient presents with  ? Numbness  ? ? ?Terri Richardson is a 51 y.o. female. ? ?HPI ? ?  51 year old female presenting with left sided facial numbness. Last normal at 9pm last night. Had a headache yesterday all day. Endorses some neck stiffness. She was told she had a slight left sided facial droop. Denies fevers, chills. No dysarthria or dysphagia. Had some difficulty closing her left eye. No unsteadiness, mild blurred vision this am.  ? ?Home Medications ?Prior to Admission medications   ?Medication Sig Start Date End Date Taking? Authorizing Provider  ?albuterol (VENTOLIN HFA) 108 (90 Base) MCG/ACT inhaler Inhale 2 puffs into the lungs every 6 (six) hours as needed for wheezing or shortness of breath. 03/10/20   [place], Betty G, MD  ?Budeson-Glycopyrrol-Formoterol (BREZTRI AEROSPHERE) 160-9-4.8 MCG/ACT AERO Inhale 2 puffs into the lungs in the morning and at bedtime. 06/29/21   Alfonse Spruce, MD  ?FASENRA PEN 30 MG/ML SOAJ INJECT 1 PEN UNDER THE SKIN AT WEEK 0, 4 AND 8, THEN INJECT 1 PEN EVERY 8 WEEKS THEREAFTER. ?Patient taking differently: Inject 1 pen under the skin at week 0, 4 and 8, then inject 1 pen every 8 weeks thereafter. 05/09/21   Alfonse Spruce, MD  ?fluticasone (FLONASE) 50 MCG/ACT nasal spray SPRAY 2 SPRAYS INTO EACH NOSTRIL EVERY DAY ?Patient taking differently: Place 2 sprays into both nostrils daily. 01/05/22   Alfonse Spruce, MD  ?ibuprofen (ADVIL,MOTRIN) 600 MG tablet Take 1 tablet (600 mg total) by mouth every 6 (six) hours as needed. 04/26/16   Lyndal Pulley, MD  ?loratadine (CLARITIN) 10 MG tablet TAKE 1 TABLET BY MOUTH EVERY DAY ?Patient taking differently: Take 10 mg by mouth daily. 01/30/22   Alfonse Spruce, MD  ?losartan (COZAAR) 25 MG tablet TAKE 1 TABLET (25 MG TOTAL) BY MOUTH DAILY. 10/28/21   [place],  Betty G, MD  ?montelukast (SINGULAIR) 10 MG tablet TAKE 1 TABLET BY MOUTH EVERYDAY AT BEDTIME; NEEDS APPT FOR REFILLS ?Patient taking differently: Take 10 mg by mouth at bedtime. TAKE 1 TABLET BY MOUTH EVERYDAY AT BEDTIME; NEEDS APPT FOR REFILLS 01/05/22   [place], Betty G, MD  ?omeprazole (PRILOSEC) 20 MG capsule Take 1 capsule (20 mg total) by mouth daily. 08/23/16   Roddie Mc, FNP  ?pravastatin (PRAVACHOL) 20 MG tablet TAKE 1 TABLET BY MOUTH EVERY DAY ?Patient taking differently: Take 20 mg by mouth daily. 10/28/21   [place], Betty G, MD  ?venlafaxine XR (EFFEXOR-XR) 75 MG 24 hr capsule Take 1 capsule (75 mg total) by mouth daily with breakfast. NEED APPT FOR REFILLS 01/05/22   [place], Betty G, MD  ?   ? ?Allergies    ?Other   ? ?Review of Systems   ?Review of Systems  ?Eyes:  Positive for visual disturbance.  ?Neurological:  Positive for facial asymmetry and numbness.  ?All other systems reviewed and are negative. ? ?Physical Exam ?Updated Vital Signs ?BP (!) 155/115 (BP Location: Right Arm)   Pulse 100   Temp (!) 97.2 ?F (36.2 ?C)   Resp 16   Ht 5\' 2"  (1.575 m)   Wt 93.4 kg   LMP 02/06/2022   SpO2 99%   BMI 37.66 kg/m?  ?Physical Exam ?Vitals and nursing note reviewed.  ?Constitutional:   ?   General: She is not in  acute distress. ?   Appearance: She is well-developed.  ?HENT:  ?   Head: Normocephalic and atraumatic.  ?Eyes:  ?   Conjunctiva/sclera: Conjunctivae normal.  ?Cardiovascular:  ?   Rate and Rhythm: Normal rate and regular rhythm.  ?   Heart sounds: No murmur heard. ?Pulmonary:  ?   Effort: Pulmonary effort is normal. No respiratory distress.  ?   Breath sounds: Normal breath sounds.  ?Abdominal:  ?   Palpations: Abdomen is soft.  ?   Tenderness: There is no abdominal tenderness.  ?Musculoskeletal:     ?   General: No swelling.  ?   Cervical back: Neck supple.  ?Skin: ?   General: Skin is warm and dry.  ?   Capillary Refill: Capillary refill takes less than 2 seconds.  ?Neurological:  ?    Mental Status: She is alert.  ?   Comments: MENTAL STATUS EXAM:    ?Orientation: Alert and oriented to person, place and time.  ?Memory: Cooperative, follows commands well.  ?Language: Speech is clear and language is normal.  ? ?CRANIAL NERVES:    ?CN 2 (Optic): Visual fields intact to confrontation.  ?CN 3,4,6 (EOM): Pupils equal and reactive to light. Full extraocular eye movement without nystagmus.  ?CN 5 (Trigeminal): Facial sensation is diminished on the left, no weakness of masticatory muscles.  ?CN 7 (Facial): Left facial droop present, spares the forehead ?CN 8 (Auditory): Auditory acuity grossly normal.  ?CN 9,10 (Glossophar): The uvula is midline, the palate elevates symmetrically.  ?CN 11 (spinal access): Normal sternocleidomastoid and trapezius strength.  ?CN 12 (Hypoglossal): The tongue is midline. No atrophy or fasciculations..  ? ?MOTOR:  Muscle Strength: 5/5RUE, 5/5LUE, 5/5RLE, 5/5LLE.  ? ?COORDINATION:   Intact finger-to-nose, no tremor, no pronator drift.  ? ?SENSATION:   Intact to light touch all four extremities. ? ?  ?Psychiatric:     ?   Mood and Affect: Mood normal.  ? ? ?ED Results / Procedures / Treatments   ?Labs ?(all labs ordered are listed, but only abnormal results are displayed) ?Labs Reviewed  ?CBC - Abnormal; Notable for the following components:  ?    Result Value  ? Hemoglobin 15.2 (*)   ? HCT 46.1 (*)   ? All other components within normal limits  ?BASIC METABOLIC PANEL - Abnormal; Notable for the following components:  ? Sodium 134 (*)   ? Glucose, Bld 132 (*)   ? All other components within normal limits  ?ETHANOL  ?PROTIME-INR  ?APTT  ?CBC WITH DIFFERENTIAL/PLATELET  ?RAPID URINE DRUG SCREEN, HOSP PERFORMED  ?URINALYSIS, ROUTINE W REFLEX MICROSCOPIC  ?HEMOGLOBIN A1C  ?LIPID PANEL  ?TSH  ?I-STAT BETA HCG BLOOD, ED ([facility], [facility], [facility] ONLY)  ? ? ?EKG ?EKG Interpretation ? ?Date/Time:  Tuesday February 14 2022 09:34:26 EDT ?Ventricular Rate:  75 ?PR Interval:  138 ?QRS Duration: 75 ?QT  Interval:  392 ?QTC Calculation: 438 ?R Axis:   13 ?Text Interpretation: Sinus rhythm Confirmed by Ernie Avena ([id number]) on 02/14/2022 3:18:22 PM ? ?Radiology ?CT Head Wo Contrast ? ?Result Date: 02/14/2022 ?CLINICAL DATA:  Woke up with facial droop and numbness to the left face. EXAM: CT HEAD WITHOUT CONTRAST TECHNIQUE: Contiguous axial images were obtained from the base of the skull through the vertex without intravenous contrast. RADIATION DOSE REDUCTION: This exam was performed according to the departmental dose-optimization program which includes automated exposure control, adjustment of the mA and/or kV according to patient size and/or use of iterative reconstruction technique.  COMPARISON:  None. FINDINGS: Brain: No evidence of acute infarction, hemorrhage, hydrocephalus, extra-axial collection or mass lesion/mass effect. Vascular: No hyperdense vessel or unexpected calcification. Skull: Normal. Negative for fracture or focal lesion. Sinuses/Orbits: No acute finding. IMPRESSION: Negative head CT. Electronically Signed   By: Tiburcio PeaJonathan  Watts M.D.   On: 02/14/2022 06:11  ? ?MR ANGIO HEAD WO CONTRAST ? ?Result Date: 02/14/2022 ?CLINICAL DATA:  Acute neuro deficit EXAM: MRI HEAD WITHOUT CONTRAST MRA HEAD WITHOUT CONTRAST MRA NECK WITHOUT CONTRAST TECHNIQUE: Multiplanar, multiecho pulse sequences of the brain and surrounding structures were obtained without intravenous contrast. Angiographic images of the Circle of Willis were obtained using MRA technique without intravenous contrast. Angiographic images of the neck were obtained using MRA technique without intravenous contrast. Carotid stenosis measurements (when applicable) are obtained utilizing NASCET criteria, using the distal internal carotid diameter as the denominator. COMPARISON:  None. CT head 02/14/2022 FINDINGS: MRI HEAD FINDINGS Brain: No acute infarction, hemorrhage, hydrocephalus, extra-axial collection or mass lesion. Minimal white matter changes likely  due to chronic microvascular ischemia. Vascular: Normal arterial flow voids at the skull base Skull and upper cervical spine: No focal abnormality. Sinuses/Orbits: Paranasal sinuses clear. Negative orbit. Other: None MRA

## 2022-02-14 NOTE — ED Notes (Signed)
L side facial numbness. Pt reports 0415 she couldn't close her L eye when blinking, L facial droop.  ?

## 2022-02-15 DIAGNOSIS — G510 Bell's palsy: Secondary | ICD-10-CM

## 2022-02-15 DIAGNOSIS — R200 Anesthesia of skin: Secondary | ICD-10-CM | POA: Diagnosis not present

## 2022-02-15 DIAGNOSIS — I10 Essential (primary) hypertension: Secondary | ICD-10-CM | POA: Diagnosis not present

## 2022-02-15 DIAGNOSIS — F419 Anxiety disorder, unspecified: Secondary | ICD-10-CM | POA: Diagnosis not present

## 2022-02-15 LAB — BASIC METABOLIC PANEL
Anion gap: 7 (ref 5–15)
BUN: 16 mg/dL (ref 6–20)
CO2: 24 mmol/L (ref 22–32)
Calcium: 9 mg/dL (ref 8.9–10.3)
Chloride: 106 mmol/L (ref 98–111)
Creatinine, Ser: 0.9 mg/dL (ref 0.44–1.00)
GFR, Estimated: >60 mL/min (ref >60)
Glucose, Bld: 113 mg/dL — ABNORMAL HIGH (ref 70–99)
Potassium: 3.6 mmol/L (ref 3.5–5.1)
Sodium: 137 mmol/L (ref 135–145)

## 2022-02-15 LAB — CBC
HCT: 41.2 % (ref 36.0–46.0)
Hemoglobin: 13.5 g/dL (ref 12.0–15.0)
MCH: 30 pg (ref 26.0–34.0)
MCHC: 32.8 g/dL (ref 30.0–36.0)
MCV: 91.6 fL (ref 80.0–100.0)
Platelets: 288 10*3/uL (ref 150–400)
RBC: 4.5 MIL/uL (ref 3.87–5.11)
RDW: 12.7 % (ref 11.5–15.5)
WBC: 9.6 10*3/uL (ref 4.0–10.5)
nRBC: 0 % (ref 0.0–0.2)

## 2022-02-15 LAB — HIV ANTIBODY (ROUTINE TESTING W REFLEX): HIV Screen 4th Generation wRfx: NONREACTIVE

## 2022-02-15 LAB — MAGNESIUM: Magnesium: 1.9 mg/dL (ref 1.7–2.4)

## 2022-02-15 MED ORDER — SODIUM CHLORIDE 0.9 % IV SOLN: INTRAVENOUS | Status: DC

## 2022-02-15 MED ORDER — ACETAMINOPHEN 325 MG PO TABS
650.0000 mg | ORAL_TABLET | ORAL | Status: AC | PRN
Start: 2022-02-15 — End: ?

## 2022-02-15 MED ORDER — PREDNISONE 10 MG PO TABS: ORAL_TABLET | ORAL | 0 refills | Status: AC

## 2022-02-15 NOTE — Progress Notes (Signed)
OT Cancellation Note ? ?Patient Details ?Name: Terri Richardson ?MRN: [id number] ?DOB: 08-18-1971 ? ? ?Cancelled Treatment:    Reason Eval/Treat Not Completed: OT screened, no needs identified, will sign off. Pt discharging home near baseline, no OT needs identified.  ? ?Luby Seamans Elane Bing Plume ?02/15/2022, 1:54 PM ?

## 2022-02-15 NOTE — Progress Notes (Signed)
[facility] night cross cover note: ? ?I was notified by RN with request for order for anxiolytic/sedation as pretreatment leading up to the patient's MRI brain this evening.  I subsequently placed order for Ativan 1 mg IV once prn for pre-treatment leading up to MRI brain/anxiety. ? ? ? ? ?Newton Pigg, DO ?Hospitalist ? ?

## 2022-02-15 NOTE — Progress Notes (Signed)
PT Cancellation Note ? ?Patient Details ?Name: Terri Richardson ?MRN: [id number] ?DOB: August 20, 1971 ? ? ?Cancelled Treatment:    Reason Eval/Treat Not Completed: Other (comment) Pt discharged home.  ? ?Farley Ly, PT, DPT  ?Acute Rehabilitation Services  ?Pager: [phone] ?Office: [phone] ? ? ? ?Terri Richardson ?02/15/2022, 1:35 PM ?

## 2022-02-15 NOTE — Discharge Summary (Signed)
Physician Discharge Summary  ?Terri BlackerSylvia Ann Richardson ZOX:[id number] DOB: 1970/11/12 DOA: 02/14/2022 ? ?PCP: [place], Betty G, MD ? ?Admit date: 02/14/2022 ?Discharge date: 02/15/2022 ? ?Time spent: 50 minutes ? ?Recommendations for Outpatient Follow-up:  ?[facility] on Bell's palsy and anterior communicating artery aneurysm, 2 mm.Marland Kitchen. ?Follow up with [place], Betty G, MD in 2 to 3 weeks. ? ? ?Discharge Diagnoses:  ?Principal Problem: ?  Bell's palsy ?Active Problems: ?  Left sided numbness ?  Facial droop ?  Essential hypertension ?  Anxiety disorder ?  Recurrent major depression in remission [facility](HCC) ?  Seasonal and perennial allergic rhinitis ?  History of asthma ?  Facial numbness ? ? ?Discharge Condition: Stable and improved ? ?Diet recommendation: Heart healthy ? ?Filed Weights  ? 02/14/22 0536  ?Weight: 93.4 kg  ? ? ?History of present illness:  ?HPI per Dr. Katrinka BlazingSmith ?Terri BlackerSylvia Ann Richardson is a 51 y.o. female with medical history significant of hypertension, diabetes mellitus type 2, asthma, depression, and GERD who presents with complaints of left-sided facial numbness that started this morning when she woke up.  She noticed that the left side of her mouth was drooping as well when she tried to take a drink.  Patient was last noted to be normal around 9 PM last night prior to going to sleep.  Yesterday, she had been dealing with a headache that she thought was related to her allergies which is not usual.  She had taken ibuprofen to try and help with symptoms.  Denies having any changes in her vision, slurred speech, fever, nausea, vomiting, dysuria, or diarrhea.  She does feel like the left side of her body is not as sensitive to touch as the right. ?  ?Upon admission into the emergency department patient was seen to be afebrile with blood pressures elevated up to 181/130, and all other vital signs maintained.  Labs significant for sodium 134 and alcohol level undetectable.  CT scan of the head did  not note any acute abnormality.  Patient was out of the window for thrombolytics.  UDS was negative.  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  Urinalysis noted large leukocytes, many bacteria, 11-20 squamous epithelial cells, and 21-50 WBC cells. ?  ?Hospital Course:  ?#1 left-sided facial droop and numbness likely secondary to Bell's palsy ?-Patient had presented after waking up on the morning of admission with left-sided facial droop with some complaints of left-sided numbness. ?-CT head done unremarkable for any acute abnormalities. ?-MRI brain done negative for any acute abnormalities. ?-MRI C-spine done negative. ?-Patient admitted placed on telemetry and neurology consulted. ?-Patient seen in consultation by neurology who felt patient had a likely left 7th cranial nerve palsy with left face/arm and leg numbness. ?-Per neurology no indication for stroke work-up at this time and recommended patient started on prednisone 60 mg daily. ?-Patient improved somewhat clinically and patient will be discharged home on a prednisone taper of 60 mg daily x5 days, then taper by 10 mg daily for x5 more days.  Eyedrops for left eye dryness recommended. ?-Outpatient follow-up with neurology and PCP. ?-Patient was discharged in stable and improved condition. ? ?2.  Abnormal urinalysis ?-Patient noted to have an abnormal UA which was positive for small hemoglobin, large leukocytes, many bacteria, 11-20 squamous epithelial cells, 21-50 WBCs. ?-Urine cultures ordered which were pending at time of discharge. ?-Patient remained asymptomatic. ?-As  patient was being started on steroids patient received a dose of fosfomycin in case she had a possible UTI. ?-Outpatient follow-up with PCP. ? ?3.  Hyponatremia ?Mild. ?-Resolved with hydration. ? ?4.  Hypertension ?-Patient maintained on home regimen losartan. ? ?5.  Hyperlipidemia ?Fasting lipid panel had a  total cholesterol 151, HDL of 50, LDL of 80, triglycerides of 103.   ?-Patient maintained on home regimen statin. ? ?6.  Anxiety/depression ?-Patient maintained on home regimen venlafaxine. ?-Outpatient follow-up. ? ?7.  Asthma/seasonal allergies ?-Patient maintained on home regimen Singulair and Claritin. ? ?8.  History of diabetes type 2/Well controlled ?-Diet controlled. ?-Hemoglobin A1c of 6.3. ?-Outpatient follow-up with PCP. ? ?9.  Anterior communicating artery aneurysm ?-Patient noted to have a 2 cm left anterior communicating artery aneurysm noted on MRA. ?-Outpatient follow-up with neurology. ? ?Procedures: ?CT head 02/14/2022 ?MRI brain/MRI C-spine 02/14/2022 ? ? ?Consultations: ?Neurology: Dr. Iver Nestle 02/14/2022 ? ?Discharge Exam: ?Vitals:  ? 02/15/22 0425 02/15/22 0824  ?BP: (!) 137/103 (!) 156/98  ?Pulse: 85 81  ?Resp: 18 18  ?Temp: 97.8 ?F (36.6 ?C) 98.4 ?F (36.9 ?C)  ?SpO2: 95% 97%  ? ? ?General: NAD.  Left facial droop. ?Cardiovascular: Regular rate rhythm no murmurs rubs or gallops.  No JVD.  No lower extremity edema. ?Respiratory: Lungs clear to auscultation bilaterally.  No wheezes, no crackles, no rhonchi.  Normal respiratory effort.  Speaking in full sentences. ? ?Discharge Instructions ? ? ?Discharge Instructions   ? ? Ambulatory referral to Neurology   Complete by: As directed ?  ? An appointment is requested in approximately: 4 weeks  ? Diet - low sodium heart healthy   Complete by: As directed ?  ? Discharge instructions   Complete by: As directed ?  ? Eyedrops to left eye for dryness periodically. ?Tape left eye periodically during the day.  Tape left eye closed at bedtime when going to bed/sleeping.  ? Increase activity slowly   Complete by: As directed ?  ? ?  ? ?Allergies as of 02/15/2022   ? ?   Reactions  ? Other Other (See Comments)  ? Catgut stitches causes water blisters  ? ?  ? ?  ?Medication List  ?  ? ?STOP taking these medications   ? ?ibuprofen 600 MG tablet ?Commonly known as:  ADVIL ?  ? ?  ? ?TAKE these medications   ? ?acetaminophen 325 MG tablet ?Commonly known as: TYLENOL ?Take 2 tablets (650 mg total) by mouth every 4 (four) hours as needed for mild pain (or temp > 37.5 C (99.5 F)). ?  ?albuterol 108 (90 Base) MCG/ACT inhaler ?Commonly known as: VENTOLIN HFA ?Inhale 2 puffs into the lungs every 6 (six) hours as needed for wheezing or shortness of breath. ?  ?Breztri Aerosphere 160-9-4.8 MCG/ACT Aero ?Generic drug: Budeson-Glycopyrrol-Formoterol ?Inhale 2 puffs into the lungs in the morning and at bedtime. ?  ?Fasenra Pen 30 MG/ML Soaj ?Generic drug: Benralizumab ?INJECT 1 PEN UNDER THE SKIN AT WEEK 0, 4 AND 8, THEN INJECT 1 PEN EVERY 8 WEEKS THEREAFTER. ?  ?fluticasone 50 MCG/ACT nasal spray ?Commonly known as: FLONASE ?SPRAY 2 SPRAYS INTO EACH NOSTRIL EVERY DAY ?What changed: See the new instructions. ?  ?loratadine 10 MG tablet ?Commonly known as: CLARITIN ?TAKE 1 TABLET BY MOUTH EVERY DAY ?  ?losartan 25 MG tablet ?Commonly known as: COZAAR ?TAKE 1 TABLET (25 MG TOTAL) BY MOUTH DAILY. ?  ?montelukast 10 MG tablet ?Commonly known as: SINGULAIR ?TAKE 1 TABLET BY  MOUTH EVERYDAY AT BEDTIME; NEEDS APPT FOR REFILLS ?What changed:  ?how much to take ?how to take this ?when to take this ?  ?omeprazole 20 MG tablet ?Commonly known as: PRILOSEC OTC ?Take 20 mg by mouth daily. ?  ?pravastatin 20 MG tablet ?Commonly known as: PRAVACHOL ?TAKE 1 TABLET BY MOUTH EVERY DAY ?  ?predniSONE 10 MG tablet ?Commonly known as: DELTASONE ?Take 6 tablets (60 mg total) by mouth daily with breakfast for 5 days, THEN 5 tablets (50 mg total) daily with breakfast for 1 day, THEN 4 tablets (40 mg total) daily with breakfast for 1 day, THEN 3 tablets (30 mg total) daily with breakfast for 1 day, THEN 2 tablets (20 mg total) daily with breakfast for 1 day, THEN 1 tablet (10 mg total) daily with breakfast for 1 day. ?Start taking on: February 16, 2022 ?  ?venlafaxine XR 75 MG 24 hr capsule ?Commonly known as:  EFFEXOR-XR ?Take 1 capsule (75 mg total) by mouth daily with breakfast. NEED APPT FOR REFILLS ?  ? ?  ? ?Allergies  ?Allergen Reactions  ? Other Other (See Comments)  ?  Catgut stitches causes water blisters  ?

## 2022-02-15 NOTE — Progress Notes (Signed)
[facility] night cross cover note: ? ?I was contacted by RN requesting clarification on timing of collection of patient's urine sample for culture.  Presenting urinalysis was abnormal with results notable for 21-50 white blood cells, but with concern for potential contamination given presence of squamous epithelial cells. Per my chart review, including review of admission H&P, the plan was to obtain a urine specimen for culture followed by administration of a single dose of Fosfomycin for potential UTI in this patient who is admitted for further evaluation/management of left-sided facial droop and numbness.  ? ?Lab confirmed that they are unable to add on a urine culture to the previously collected urinalysis.  RN conveys to me that the patient has not recently needed to urinate.  ? ?In terms of the timing/means of collection of an additional urine sample for culture: The patient is not septic appearing and continues to appear hemodynamically stable.  Additionally, she does not appear to be hospitalized for a primary infectious process.  Therefore, in weighing risks versus benefits of pursuing straight cath for obtaining urine culture versus waiting for the patient to be able to spontaneously urinate, I have asked RN to please await collection of urine specimen until the patient is able to urinate.  Additionally, I have asked that we continue to hold the single dose of Fosfomycin until this urine specimen has been collected.  ? ? ? ?Newton Pigg, DO ?Hospitalist  ?

## 2022-02-15 NOTE — Plan of Care (Signed)
?  Problem: Clinical Measurements: ?Goal: Diagnostic test results will improve ?Outcome: Progressing ?  ?Problem: Clinical Measurements: ?Goal: Will remain free from infection ?Outcome: Progressing ?  ?Problem: Education: ?Goal: Knowledge of disease or condition will improve ?Outcome: Progressing ?  ?

## 2022-02-15 NOTE — Progress Notes (Addendum)
Neurology Progress Note ? ? ?S:// ?Patient awake and alert sitting up in bed. RN at bedside. Patient states slept well and her numbness on left arm and leg has resolved, still with the left facial droop and left eye ptosis. She c/o of occasional blurry vision ? ? ?O:// ?Current vital signs: ?BP (!) 156/98 (BP Location: Left Arm)   Pulse 81   Temp 98.4 ?F (36.9 ?C) (Oral)   Resp 18   Ht 5\' 2"  (1.575 m)   Wt 93.4 kg   LMP 02/06/2022   SpO2 97%   BMI 37.66 kg/m?  ?Vital signs in last 24 hours: ?Temp:  [97.8 ?F (36.6 ?C)-98.8 ?F (37.1 ?C)] 98.4 ?F (36.9 ?C) (04/26 [id number]) ?Pulse Rate:  [68-100] 81 (04/26 0824) ?Resp:  [12-18] 18 (04/26 0824) ?BP: (120-165)/(83-131) 156/98 (04/26 [id number]) ?SpO2:  [93 %-99 %] 97 % (04/26 0824) ? ?GENERAL: Awake, alert in NAD ?HEENT: - Normocephalic and atraumatic, dry mm ?LUNGS - Clear to auscultation bilaterally with no wheezes ?CV - S1S2 RRR, no m/r/g, equal pulses bilaterally. ?ABDOMEN - Soft, nontender, nondistended with normoactive BS ?Ext: warm, well perfused, intact peripheral pulses, no edema ?PSYCH: appropriate Mood and affect  ? ?NEURO:  ?Mental Status: AA&Ox4 ?Language: speech is clear.  Naming, repetition, fluency, and comprehension intact. ?Cranial Nerves: PERRL 3 mm/brisk. EOMI, visual fields full,  left eye ptosis, less blinking, left facial asymmetry, facial sensation intact, hearing intact, tongue/uvula/soft palate midline, normal sternocleidomastoid and trapezius muscle strength. No evidence of tongue atrophy or fibrillations ?Motor: 5/5 in all 4 extremities ?Tone: is normal and bulk is normal ?Sensation- Intact to light touch bilaterally ?Coordination: FTN intact bilaterally, no ataxia in BLE. ?Gait- deferred ? ? ?Medications ? ?Current Facility-Administered Medications:  ?  0.9 %  sodium chloride infusion, , Intravenous, Continuous, Eugenie Filler, MD ?  acetaminophen (TYLENOL) tablet 650 mg, 650 mg, Oral, Q4H PRN **OR** acetaminophen (TYLENOL) 160 MG/5ML  solution 650 mg, 650 mg, Per Tube, Q4H PRN **OR** acetaminophen (TYLENOL) suppository 650 mg, 650 mg, Rectal, Q4H PRN, Smith, Rondell A, MD ?  enoxaparin (LOVENOX) injection 45 mg, 45 mg, Subcutaneous, Q24H, Smith, Rondell A, MD, 45 mg at 02/14/22 2238 ?  loratadine (CLARITIN) tablet 10 mg, 10 mg, Oral, Daily, Tamala Julian, Rondell A, MD, 10 mg at 02/15/22 0843 ?  losartan (COZAAR) tablet 25 mg, 25 mg, Oral, Daily, Smith, Rondell A, MD, 25 mg at 02/15/22 0845 ?  mometasone-formoterol (DULERA) 100-5 MCG/ACT inhaler 2 puff, 2 puff, Inhalation, BID, 2 puff at 02/15/22 0717 **AND** umeclidinium bromide (INCRUSE ELLIPTA) 62.5 MCG/ACT 1 puff, 1 puff, Inhalation, Daily, Tamala Julian, Rondell A, MD, 1 puff at 02/15/22 0717 ?  montelukast (SINGULAIR) tablet 10 mg, 10 mg, Oral, QHS, Smith, Rondell A, MD, 10 mg at 02/14/22 2236 ?  pantoprazole (PROTONIX) EC tablet 40 mg, 40 mg, Oral, Daily, Tamala Julian, Rondell A, MD, 40 mg at 02/15/22 0844 ?  pravastatin (PRAVACHOL) tablet 20 mg, 20 mg, Oral, QHS, Smith, Rondell A, MD ?  predniSONE (DELTASONE) tablet 60 mg, 60 mg, Oral, Q breakfast, Tamala Julian, Rondell A, MD, 60 mg at 02/15/22 0844 ?  venlafaxine XR (EFFEXOR-XR) 24 hr capsule 75 mg, 75 mg, Oral, Q breakfast, Tamala Julian, Rondell A, MD, 75 mg at 02/15/22 0845 ? ? ?Imaging ?I have reviewed images in epic and the results pertinent to this consultation are: ? ?CT-scan of the brain 4/25: ?No acute process  ? ?MRI w/wo examination of the brain 4/25 ?No acute intracranial abnormality. Mild white matter changes ?consistent with  chronic microvascular ischemia. No abnormal parenchymal, meningeal, or osseous enhancement. ? ?MRA H/N 4/25: ?No intracranial stenosis or large vessel occlusion. Negative for vertebral or carotid stenosis in the neck. 4. 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 ?  ?Assessment:  ?51 year old female with stroke risk factors of hypertension type 2 diabetes without hyperglycemia (diet controlled ) and  obesity with BMI of 37.66  presented with left facial numbness and drooping.reported having headache all day yesterday before going to bed and usually she just takes ibuprofen for headache ,not on prescription medication for headache. ? ?Bell's Palsy  ?- with negative imaging and unclear symptomology will start steroids for a total of 10 days  ? ?Recommendations: ?- Prednisone 60mg  daily x 5 days, then taper by 10 mg for 5 days. (50mg  daily X 1 day, 40mg  daily x 1 day, 30mg  daily x 1 day, 20 mg daily x 1day, 10mg  daily x 1day ) ?- Eye drops for left eye dryness ?- outpatient neurology follow up  ?- neurology will sign off. Please call with any questions or concerns ? ?- Plan discussed with Dr. Grandville Silos  ? ?Beulah Gandy DNP, ACNPC-AG  ? ? ?  ?

## 2022-02-17 LAB — URINE CULTURE: Culture: >=100000 — AB

## 2022-03-08 ENCOUNTER — Ambulatory Visit: Payer: BC Managed Care – PPO | Admitting: Allergy & Immunology

## 2022-03-08 NOTE — Progress Notes (Unsigned)
HPI: Terri Richardson is a 51 y.o. female, who is here today for follow up. Last seen on 01/25/21. Since her last visit she has seen immunologist. Hospitalization from 02/14/22 to 02/15/22 due to facial numbness and facial droop. Dx'ed with Bell's palsy. She was also treated for UTI, denies having any symptom.  Anterior communicating aneurysm seen on brain MRI. She has an appt with neurologist on 04/11/22. Left-sided facial weakness seems to be improving.  Hypertension:  Medications: Losartan 25 mg daily  BP readings at home:140's/80's. Side effects:None. Negative for exertional chest pain, dyspnea, new  focal weakness, or edema.  Having headaches. Hx of migraines , but she does not have them often.Headache she is having is more like pressure/achy left occipital and neck. She thinks it is caused by position she is sleeping at night, she cannot sleep on right side, cannot breath through her nose.  Tylenol does not help. Trying to avoid Ibuprofen.  Lab Results  Component Value Date   CREATININE 0.90 02/15/2022   BUN 16 02/15/2022   NA 137 02/15/2022   K 3.6 02/15/2022   CL 106 02/15/2022   CO2 24 02/15/2022   Hyperlipidemia: Currently on Pravastatin 20 mg daily. Side effects from medication:None.  Lab Results  Component Value Date   CHOL 151 02/14/2022   HDL 50 02/14/2022   LDLCALC 80 02/14/2022   TRIG 103 02/14/2022   CHOLHDL 3.0 02/14/2022   DM: She is on non pharmacologic treatment. Lab Results  Component Value Date   HGBA1C 6.3 (H) 02/14/2022  She is not checking BS's. Lab Results  Component Value Date   MICROALBUR 28.9 (H) 03/15/2020   MICROALBUR 17.3 (H) 08/04/2019   Mood swings, Effexor is not helping with anxiety. Some depressed mood since Bell's palsy. Easily irritable, goes to "screaming rage." In the past she tried Sertraline and Paxil. She felt like Sertraline helped, not sure why it was discontinued.  She would like to change Effexor to a  different medication.    03/10/2022    8:03 AM 09/28/2016    1:05 PM  Depression screen PHQ 2/9  Decreased Interest 3 1  Down, Depressed, Hopeless 3 1  PHQ - 2 Score 6 2  Altered sleeping 3 1  Tired, decreased energy 3 1  Change in appetite 0 0  Feeling bad or failure about yourself  0 0  Trouble concentrating 0 1  Moving slowly or fidgety/restless 0 0  Suicidal thoughts 0 0  PHQ-9 Score 12 5  Difficult doing work/chores Extremely dIfficult Not difficult at all   LE discomfort when lying down. No pain, "weird" feeling on calves. No edema or erythema. Occasional tinging. Urge to move legs, which alleviate symptoms. It happens every night for a couple of months.  Review of Systems  Constitutional:  Positive for fatigue. Negative for activity change and appetite change.  HENT:  Negative for mouth sores, nosebleeds and trouble swallowing.   Respiratory:  Negative for cough and wheezing.   Gastrointestinal:  Negative for abdominal pain, nausea and vomiting.       Negative for changes in bowel habits.  Genitourinary:  Negative for decreased urine volume and hematuria.  Neurological:  Negative for syncope and speech difficulty.  Rest of ROS see pertinent positives and negatives in HPI.  Current Outpatient Medications on File Prior to Visit  Medication Sig Dispense Refill   acetaminophen (TYLENOL) 325 MG tablet Take 2 tablets (650 mg total) by mouth every 4 (four) hours as needed for  mild pain (or temp > 37.5 C (99.5 F)).     albuterol (VENTOLIN HFA) 108 (90 Base) MCG/ACT inhaler Inhale 2 puffs into the lungs every 6 (six) hours as needed for wheezing or shortness of breath. 18 g 3   Budeson-Glycopyrrol-Formoterol (BREZTRI AEROSPHERE) 160-9-4.8 MCG/ACT AERO Inhale 2 puffs into the lungs in the morning and at bedtime. 10.7 g 5   FASENRA PEN 30 MG/ML SOAJ INJECT 1 PEN UNDER THE SKIN AT WEEK 0, 4 AND 8, THEN INJECT 1 PEN EVERY 8 WEEKS THEREAFTER. (Patient taking differently: Inject 1 pen  under the skin at week 0, 4 and 8, then inject 1 pen every 8 weeks thereafter.) 1 mL 7   fluticasone (FLONASE) 50 MCG/ACT nasal spray SPRAY 2 SPRAYS INTO EACH NOSTRIL EVERY DAY (Patient taking differently: Place 2 sprays into both nostrils daily as needed for allergies.) 48 mL 0   loratadine (CLARITIN) 10 MG tablet TAKE 1 TABLET BY MOUTH EVERY DAY (Patient taking differently: Take 10 mg by mouth daily.) 30 tablet 5   montelukast (SINGULAIR) 10 MG tablet TAKE 1 TABLET BY MOUTH EVERYDAY AT BEDTIME; NEEDS APPT FOR REFILLS (Patient taking differently: Take 10 mg by mouth at bedtime. TAKE 1 TABLET BY MOUTH EVERYDAY AT BEDTIME; NEEDS APPT FOR REFILLS) 30 tablet 0   omeprazole (PRILOSEC OTC) 20 MG tablet Take 20 mg by mouth daily.     pravastatin (PRAVACHOL) 20 MG tablet TAKE 1 TABLET BY MOUTH EVERY DAY (Patient taking differently: Take 20 mg by mouth daily.) 90 tablet 1   Current Facility-Administered Medications on File Prior to Visit  Medication Dose Route Frequency Provider Last Rate Last Admin   Benralizumab SOSY 30 mg  30 mg Subcutaneous Q28 days Alfonse Spruce, MD   30 mg at 03/03/21 1532    Past Medical History:  Diagnosis Date   Allergy    Asthma    Depression    Diabetes mellitus 2006   GERD (gastroesophageal reflux disease)    Hypertension    Allergies  Allergen Reactions   Other Other (See Comments)    Catgut stitches causes water blisters    Social History   Socioeconomic History   Marital status: Married    Spouse name: Not on file   Number of children: Not on file   Years of education: Not on file   Highest education level: Not on file  Occupational History   Occupation: Works at [employer]  Tobacco Use   Smoking status: Former    Packs/day: 0.50    Years: 4.00    Pack years: 2.00    Types: Cigarettes    Quit date: 02/20/2014    Years since quitting: 8.0   Smokeless tobacco: Never  Substance and Sexual Activity   Alcohol use: No    Alcohol/week: 0.0  standard drinks   Drug use: No   Sexual activity: Yes    Birth control/protection: None  Other Topics Concern   Not on file  Social History Narrative   Not on file   Social Determinants of Health   Financial Resource Strain: Not on file  Food Insecurity: Not on file  Transportation Needs: Not on file  Physical Activity: Not on file  Stress: Not on file  Social Connections: Not on file   Vitals:   03/10/22 0757  BP: 136/80  Pulse: (!) 103  Resp: 16  Temp: 98.7 F (37.1 C)  SpO2: 97%   Body mass index is 39.16 kg/m.  Physical Exam Vitals  and nursing note reviewed.  Constitutional:      General: She is not in acute distress.    Appearance: She is well-developed.  HENT:     Head: Normocephalic and atraumatic.     Mouth/Throat:     Mouth: Mucous membranes are moist.     Pharynx: Oropharynx is clear.  Eyes:     Conjunctiva/sclera: Conjunctivae normal.  Cardiovascular:     Rate and Rhythm: Normal rate and regular rhythm.     Pulses:          Dorsalis pedis pulses are 2+ on the right side and 2+ on the left side.     Heart sounds: No murmur heard.    Comments: HR 88/min Pulmonary:     Effort: Pulmonary effort is normal. No respiratory distress.     Breath sounds: Normal breath sounds.  Abdominal:     Palpations: Abdomen is soft. There is no hepatomegaly or mass.     Tenderness: There is no abdominal tenderness.  Musculoskeletal:     Cervical back: No tenderness or bony tenderness. Pain with movement present. Normal range of motion.  Lymphadenopathy:     Cervical: No cervical adenopathy.  Skin:    General: Skin is warm.     Findings: No erythema or rash.  Neurological:     Mental Status: She is alert and oriented to person, place, and time.     Gait: Gait normal.     Comments: Left facial weakness. Rest no focal deficit.  Psychiatric:        Mood and Affect: Mood is anxious.     Comments: Well groomed, good eye contact.   ASSESSMENT AND PLAN:  Ms.Unita  was seen today for follow-up.  Diagnoses and all orders for this visit: Orders Placed This Encounter  Procedures   Microalbumin / creatinine urine ratio   Urinalysis, Routine w reflex microscopic   Lab Results  Component Value Date   MICROALBUR 3.1 (H) 03/10/2022   MICROALBUR 28.9 (H) 03/15/2020   Type 2 diabetes mellitus with other specified complication, without long-term current use of insulin (HCC) HgA1C at goal. Continue non pharmacologic treatment. Annual eye exam, periodic dental and foot care recommended. F/U in 5-6 months  Essential hypertension Mildly elevated BP's at home. Recommend increasing dose of Losartan from 25 mg to 50 mg daily. Low salt/DASH diet recommended. Continue monitoring BP at home.  -     losartan (COZAAR) 50 MG tablet; Take 1 tablet (50 mg total) by mouth daily.  Anxiety disorder, unspecified type Problem is not well controlled. Will start weaning off Effexor and starting Sertraline.  Effexor to start weaning off, start alternating between 75 mg to 37.5 mg every other day for 2 weeks then continue 37.5 mg every day for 2 weeks then every other day for 2 weeks and every 3rd day until gone. Start Sertraline 50 mg 1/2 tab daily and whole tab when taking Effexor 37.5 mg every other day. CBT recommended, provider list with contact information given.  -     venlafaxine XR (EFFEXOR XR) 37.5 MG 24 hr capsule; Alternate with 75 mg every other day for 2 weeks then continue 37.5 mg daily for 2 weeks then every other day for 2 weeks then every 3rd day for a week. -     venlafaxine XR (EFFEXOR-XR) 75 MG 24 hr capsule; Every other day for 2 weeks. -     sertraline (ZOLOFT) 50 MG tablet; Start taking 1/2 tab daily and increase to  whole tab when taking Effexor 37.5 mg every other day.  Hyperlipidemia associated with type 2 diabetes mellitus (HCC) Continue Pravastatin 20 mg daily. Low fat diet also recommended.  Hematuria, microscopic Has had + blood a few  times. Further recommendations according to UA result.  RLS (restless legs syndrome) We discussed Dx and treatment options. She agrees with Requip 0.5 mg 1/2-1 tab at bedtime.  -     rOPINIRole (REQUIP) 0.5 MG tablet; Take 0.5-1 tablets (0.25-0.5 mg total) by mouth at bedtime.  Bell's palsy Slowly improving. Facial ROM exercises recommended. Natural tears gel and taping left eye at bedtime recommended. Has appt with neuro.  Headache, unspecified headache type ? Tension like headache. She can try Excedrin migraine. Instructed about warning signs. Keep appt with neuro.  Return in about 2 months (around 05/10/2022).  Drayke Grabel G. [place], MD  [facility]. [place] office.

## 2022-03-10 ENCOUNTER — Encounter: Payer: Self-pay | Admitting: Family Medicine

## 2022-03-10 ENCOUNTER — Ambulatory Visit (INDEPENDENT_AMBULATORY_CARE_PROVIDER_SITE_OTHER): Payer: BC Managed Care – PPO | Admitting: Family Medicine

## 2022-03-10 VITALS — BP 136/80 | HR 103 | Temp 98.7°F | Resp 16 | Ht 62.0 in | Wt 214.1 lb

## 2022-03-10 DIAGNOSIS — I10 Essential (primary) hypertension: Secondary | ICD-10-CM | POA: Diagnosis not present

## 2022-03-10 DIAGNOSIS — E1169 Type 2 diabetes mellitus with other specified complication: Secondary | ICD-10-CM | POA: Diagnosis not present

## 2022-03-10 DIAGNOSIS — G2581 Restless legs syndrome: Secondary | ICD-10-CM

## 2022-03-10 DIAGNOSIS — G510 Bell's palsy: Secondary | ICD-10-CM

## 2022-03-10 DIAGNOSIS — R519 Headache, unspecified: Secondary | ICD-10-CM

## 2022-03-10 DIAGNOSIS — R3129 Other microscopic hematuria: Secondary | ICD-10-CM | POA: Diagnosis not present

## 2022-03-10 DIAGNOSIS — F419 Anxiety disorder, unspecified: Secondary | ICD-10-CM

## 2022-03-10 DIAGNOSIS — E785 Hyperlipidemia, unspecified: Secondary | ICD-10-CM

## 2022-03-10 LAB — MICROALBUMIN / CREATININE URINE RATIO
Creatinine,U: 87.6 mg/dL
Microalb Creat Ratio: 3.5 mg/g (ref 0.0–30.0)
Microalb, Ur: 3.1 mg/dL — ABNORMAL HIGH (ref 0.0–1.9)

## 2022-03-10 LAB — URINALYSIS, ROUTINE W REFLEX MICROSCOPIC
Bilirubin Urine: NEGATIVE
Ketones, ur: NEGATIVE
Leukocytes,Ua: NEGATIVE
Nitrite: NEGATIVE
Specific Gravity, Urine: 1.015 (ref 1.000–1.030)
Total Protein, Urine: NEGATIVE
Urine Glucose: NEGATIVE
Urobilinogen, UA: 0.2 (ref 0.0–1.0)
WBC, UA: NONE SEEN (ref 0–?)
pH: 6.5 (ref 5.0–8.0)

## 2022-03-10 MED ORDER — VENLAFAXINE HCL ER 75 MG PO CP24: ORAL_CAPSULE | ORAL | 0 refills | Status: DC

## 2022-03-10 MED ORDER — LOSARTAN POTASSIUM 50 MG PO TABS
50.0000 mg | ORAL_TABLET | Freq: Every day | ORAL | 1 refills | Status: DC
Start: 2022-03-10 — End: 2022-08-15

## 2022-03-10 MED ORDER — VENLAFAXINE HCL ER 37.5 MG PO CP24: ORAL_CAPSULE | ORAL | 0 refills | Status: DC

## 2022-03-10 MED ORDER — SERTRALINE HCL 50 MG PO TABS: ORAL_TABLET | ORAL | 1 refills | Status: DC

## 2022-03-10 MED ORDER — ROPINIROLE HCL 0.5 MG PO TABS: 0.2500 mg | ORAL_TABLET | Freq: Every day | ORAL | 1 refills | Status: DC

## 2022-03-10 NOTE — Patient Instructions (Addendum)
A few things to remember from today's visit:   Type 2 diabetes mellitus with other specified complication, without long-term current use of insulin ([place]) - Plan: Microalbumin / creatinine urine ratio  Essential hypertension - Plan: losartan (COZAAR) 50 MG tablet  Anxiety disorder, unspecified type  Hyperlipidemia associated with type 2 diabetes mellitus (HCC)  Hematuria, microscopic - Plan: Urinalysis, Routine w reflex microscopic  RLS (restless legs syndrome) - Plan: rOPINIRole (REQUIP) 0.5 MG tablet  If you need refills please call your pharmacy. Do not use My Chart to request refills or for acute issues that need immediate attention.   Today Losartan increased from 25  to 50 mg daily. Continue monitoring blood pressure. Requip at bedtime, 1/2-1 tab. Effexor to start weaning off, start alternating between 75 mg to 37.5 mg every other day for 2 weeks then continue 37.5 mg every day for 2 weeks then every other day for 2 weeks and every 3rd day until gone. Start Sertraline 50 mg 1/2 tab daily and whole tab when taking Effexor 37.5 mg every other day.  Please be sure medication list is accurate. If a new problem present, please set up appointment sooner than planned today.

## 2022-03-24 ENCOUNTER — Ambulatory Visit: Payer: BC Managed Care – PPO | Admitting: Allergy & Immunology

## 2022-03-24 ENCOUNTER — Encounter: Payer: Self-pay | Admitting: Allergy & Immunology

## 2022-03-24 VITALS — BP 142/80 | HR 94 | Temp 98.2°F | Resp 18 | Ht 67.75 in | Wt 214.5 lb

## 2022-03-24 DIAGNOSIS — J4550 Severe persistent asthma, uncomplicated: Secondary | ICD-10-CM

## 2022-03-24 DIAGNOSIS — B999 Unspecified infectious disease: Secondary | ICD-10-CM

## 2022-03-24 DIAGNOSIS — J3089 Other allergic rhinitis: Secondary | ICD-10-CM

## 2022-03-24 DIAGNOSIS — J302 Other seasonal allergic rhinitis: Secondary | ICD-10-CM

## 2022-03-24 NOTE — Progress Notes (Signed)
FOLLOW UP  Date of Service/Encounter:  03/24/22   Assessment:   Severe persistent asthma, uncomplicated - with eosinophilic phenotype (AEC of 1200) on Fasenra    Perennial and seasonal allergic rhinitis (mixed feather, trees, outdoor molds, cat, and cockroach)   Recurrent infections - needs Pneumovax and Tdap (but infections have decreased in frequency so patient has not done this)  Plan/Recommendations:   1. Severe persistent asthma, uncomplicated - Lung testing looked much better today.  - Continue with Fasenra. - Try changing your Breztri to one puff twice daily to see if you can tolerate this.  - Daily controller medication(s): Breztri 1-2 puffs twice daily with a spacer and Fasenra - Prior to physical activity: albuterol 2 puffs 10-15 minutes before physical activity. - Rescue medications: albuterol 4 puffs every 4-6 hours as needed - Asthma control goals:  * Full participation in all desired activities (may need albuterol before activity) * Albuterol use two time or less a week on average (not counting use with activity) * Cough interfering with sleep two time or less a month * Oral steroids no more than once a year * No hospitalizations  2. Chronic rhinitis (mixed feather, trees, outdoor molds, cat, and cockroach) - Continue with: Claritin (loratadine) 10mg  tablet once daily and Flonase (fluticasone) one spray per nostril daily as needed. - We could consider allergy shots as a means of long-term control, but you seem to have everything under fairly good control yourself.   3. Follow up in 6 months or earlier if needed.     Subjective:   Terri Richardson is a 51 y.o. female presenting today for follow up of  Chief Complaint  Patient presents with   Asthma    Gets the fasenra shots. Asthma has been doing better she feels like the injections have been helping. No more wheezing and hasn't had to use the rescue inhaler as often anymore   Allergic Rhinitis     Try's  to avoid things that triggers her allergies. Allergies are okay.     Terri BlackerSylvia Ann Richardson has a history of the following: Patient Active Problem List   Diagnosis Date Noted   Bell's palsy    Facial numbness    Left sided numbness 02/14/2022   Facial droop 02/14/2022   History of asthma 02/14/2022   Severe persistent asthma without complication 06/29/2021   Recurrent infections 06/29/2021   Seasonal and perennial allergic rhinitis 06/29/2021   Hyperlipidemia associated with type 2 diabetes mellitus (HCC) 03/28/2019   Recurrent major depression in remission (HCC) 05/24/2018   Anxiety disorder 09/07/2017   Morbid obesity (HCC) 03/14/2016   Asthmatic bronchitis , chronic (HCC) 01/26/2016   Dyspnea 01/25/2016   Essential hypertension 01/25/2016   Type 2 diabetes mellitus (HCC) 01/03/2016   Elevated blood pressure 01/03/2016   Mild intermittent reactive airway disease with wheezing with acute exacerbation 01/03/2016   Acute cholecystitis 11/12/2011    History obtained from: chart review and patient.  Terri Richardson is a [age] y.o. female presenting for a follow up visit. She was last seen in September 2022. At that time, we continued with Breztri two puffs BID as well as albuterol as needed. Testing was positive to mixed feather, trees, outdoor molds, cat, and cockroach. We continue with loratadine and Flonase. For her recurrent infections, we continued with Tdap and Pneumovax since her titers were so low.  Since the last visit, she has done very well.   Asthma/Respiratory Symptom History: Her husband gives her the [place] injection  in her arm.  She remains on her Breztri two puffs twice daily. She has had a zero copayment. She has not needed prednisone for breathing. She is very happy with how well she is doing. She does not struggle to do things like she did in the past. She was having problems walking across her home, but this is not a problem. She has used her rescue inhaler since being on Fasenra.    Allergic Rhinitis Symptom History: She will have intermittent rhinorrhea. She has a dog and she knows that this is a trigger.  She does not use her Flonase on a routine basis. This is only on the severe days.   She has not gotten an antibiotic in ages. She does not remember when she last got antibiotics. She was having them every couple of months, but this is not a problem any longer.   She came down with Bell's palsy in April 2023 and was started on a round of prednisone for ten days. It is on the left side of her face. It was rather dramatic when it first started. She went to the ED and had a thorough workup and it was all normal.   She lives in [place], which is going through some changes with increased housing starts. She apparently grew up in [place] on a [place] farm. She likes living closer to a larger city, but she would like to ideally retire back in [place] on her family farm. 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   Otherwise, there have been no changes to her past medical history, surgical history, family history, or social history.    Review of Systems  Constitutional: Negative.  Negative for chills, fever, malaise/fatigue and weight loss.  HENT: Negative.  Negative for congestion, ear discharge, ear pain and sore throat.   Eyes:  Negative for pain, discharge and redness.  Respiratory:  Negative for cough, sputum production, shortness of breath and wheezing.   Cardiovascular: Negative.  Negative for chest pain and palpitations.  Gastrointestinal:  Negative for abdominal pain, constipation, diarrhea, heartburn, nausea and vomiting.  Skin: Negative.  Negative for itching and rash.  Neurological:  Negative for dizziness and headaches.  Endo/Heme/Allergies:  Negative for environmental allergies. Does not bruise/bleed easily.      Objective:   Blood pressure (!) 142/80, pulse 94, temperature 98.2 F (36.8 C), resp. rate 18, height  5' 7.75" (1.721 m), weight 214 lb 8 oz (97.3 kg), SpO2 96 %. Body mass index is 32.86 kg/m.    Physical Exam Vitals reviewed.  Constitutional:      Appearance: She is well-developed.     Comments: Very pleasant.  HENT:     Head: Normocephalic and atraumatic.     Right Ear: Tympanic membrane, ear canal and external ear normal.     Left Ear: Tympanic membrane, ear canal and external ear normal.     Nose: No nasal deformity, septal deviation, mucosal edema or rhinorrhea.     Right Turbinates: Enlarged, swollen and pale.     Left Turbinates: Enlarged, swollen and pale.     Right Sinus: No maxillary sinus tenderness or frontal sinus tenderness.     Left Sinus: No maxillary sinus tenderness or frontal sinus tenderness.     Comments: No nasal polyps.    Mouth/Throat:     Mouth: Mucous membranes are not pale and not dry.     Pharynx: Uvula midline.  Eyes:     General:  Lids are normal. No allergic shiner.       Right eye: No discharge.        Left eye: No discharge.     Conjunctiva/sclera: Conjunctivae normal.     Right eye: Right conjunctiva is not injected. No chemosis.    Left eye: Left conjunctiva is not injected. No chemosis.    Pupils: Pupils are equal, round, and reactive to light.  Cardiovascular:     Rate and Rhythm: Normal rate and regular rhythm.     Heart sounds: Normal heart sounds.  Pulmonary:     Effort: Pulmonary effort is normal. No tachypnea, accessory muscle usage or respiratory distress.     Breath sounds: Normal breath sounds. No wheezing, rhonchi or rales.  Chest:     Chest wall: No tenderness.  Lymphadenopathy:     Cervical: No cervical adenopathy.  Skin:    Coloration: Skin is not pale.     Findings: No abrasion, erythema, petechiae or rash. Rash is not papular, urticarial or vesicular.  Neurological:     Mental Status: She is alert.  Psychiatric:        Behavior: Behavior is cooperative.     Diagnostic studies:    Spirometry: results normal (FEV1:  2.30/75%, FVC: 2.83/74%, FEV1/FVC: 81%).    Spirometry consistent with normal pattern.    Allergy Studies: none        Malachi Bonds, MD  [facility]

## 2022-03-24 NOTE — Patient Instructions (Addendum)
1. Severe persistent asthma, uncomplicated - Lung testing looked much better today.  - Continue with Fasenra. - Try changing your Breztri to one puff twice daily to see if you can tolerate this.  - Daily controller medication(s): Breztri 1-2 puffs twice daily with a spacer - Prior to physical activity: albuterol 2 puffs 10-15 minutes before physical activity. - Rescue medications: albuterol 4 puffs every 4-6 hours as needed - Asthma control goals:  * Full participation in all desired activities (may need albuterol before activity) * Albuterol use two time or less a week on average (not counting use with activity) * Cough interfering with sleep two time or less a month * Oral steroids no more than once a year * No hospitalizations  2. Chronic rhinitis (mixed feather, trees, outdoor molds, cat, and cockroach) - Continue with: Claritin (loratadine) 10mg  tablet once daily and Flonase (fluticasone) one spray per nostril daily as needed. - We could consider allergy shots as a means of long-term control, but you seem to have everything under fairly good control yourself.   3. Follow up in 6 months or earlier if needed.    Please inform of any Emergency Department visits, hospitalizations, or changes in symptoms. Call us before going to the ED for breathing or allergy symptoms since we might be able to fit you in for a sick visit. Feel free to contact us anytime with any questions, problems, or concerns.  It was a pleasure to see you again today!  Websites that have reliable patient information: 1. American Academy of Asthma, Allergy, and Immunology: www.aaaai.org 2. Food Allergy Research and Education (FARE): foodallergy.org 3. Mothers of Asthmatics: http://www.asthmacommunitynetwork.org 4. American College of Allergy, Asthma, and Immunology: www.acaai.org   COVID-19 Vaccine Information can be found at: [place] For  questions related to vaccine distribution or appointments, please email [email or web address] or call [phone].   We realize that you might be concerned about having an allergic reaction to the COVID19 vaccines. To help with that concern, WE ARE OFFERING THE COVID19 VACCINES IN OUR OFFICE! Ask the front desk for dates!     "Like" [phone] on Facebook and Instagram for our latest updates!      A healthy democracy works best when [place] participate! Make sure you are registered to vote! If you have moved or changed any of your contact information, you will need to get this updated before voting!  In some cases, you MAY be able to register to vote online: [employer]

## 2022-03-27 ENCOUNTER — Encounter: Payer: Self-pay | Admitting: Allergy & Immunology

## 2022-04-02 ENCOUNTER — Other Ambulatory Visit: Payer: Self-pay | Admitting: Family Medicine

## 2022-04-02 DIAGNOSIS — G2581 Restless legs syndrome: Secondary | ICD-10-CM

## 2022-04-10 ENCOUNTER — Telehealth: Payer: Self-pay | Admitting: Neurology

## 2022-04-10 NOTE — Telephone Encounter (Signed)
LVM and sent mychart msg informing pt of r/s needed for 6/20- Dr. Terrace Arabia out.

## 2022-04-11 ENCOUNTER — Ambulatory Visit: Payer: BC Managed Care – PPO | Admitting: Neurology

## 2022-04-20 ENCOUNTER — Encounter: Payer: Self-pay | Admitting: Neurology

## 2022-04-20 ENCOUNTER — Ambulatory Visit (INDEPENDENT_AMBULATORY_CARE_PROVIDER_SITE_OTHER): Payer: BC Managed Care – PPO | Admitting: Neurology

## 2022-04-20 VITALS — BP 150/105 | HR 87 | Ht 67.75 in | Wt 215.0 lb

## 2022-04-20 DIAGNOSIS — I671 Cerebral aneurysm, nonruptured: Secondary | ICD-10-CM | POA: Diagnosis not present

## 2022-04-20 DIAGNOSIS — G510 Bell's palsy: Secondary | ICD-10-CM | POA: Diagnosis not present

## 2022-04-20 NOTE — Progress Notes (Signed)
Chief Complaint  Patient presents with   New Patient (Initial Visit)    Rm 17. Accompanied by husband, Gala Romney. NP internal referral for Bell's palsy and anterior communicating artery aneurysm, 2 mm.      ASSESSMENT AND PLAN  Terri Richardson is a 51 y.o. female  Left Bell's palsy since April 2023  Already regained significant but yet incomplete recovery, hopefully will continue to recover,  Left eye closure weakness, with slit of opening, suggested artificial tear while asleep,  Incidental finding of 2 mm left anterior communicating artery aneurysm,  Low risk, no family history of aneurysm, I do not think she needs follow-up imaging study, advised her to contact clinic if you develop focal signs or worsening headache    DIAGNOSTIC DATA (LABS, IMAGING, TESTING) - I reviewed patient records, labs, notes, testing and imaging myself where available.   MEDICAL HISTORY:  Terri Richardson is a 50 year old female, accompanied by her husband, seen in request by Dr. Ramiro Harvest, to follow-up hospital discharge in April, her primary care physician is Dr. [place], Betty G, initial evaluation was on April 20, 2022,  I reviewed and summarized the referring note. PMHx GERD HLD Depression, Restless leg syndrome Pre-DM HTN  She woke up on February 14, 2022, noticed difficulty moving her left face, difficulty closing her eyes, left mouth droopy, denies sensory loss, 2 days to onset of her weakness, she describes left, discomfort, as if has eaten some spicy food  The day prior on February 13, 2022, she had a headache,  She had extensive evaluations, MRI of the brain showed no significant abnormality, MRA of the brain incidental note of 2 mm left anterior communicating artery aneurysm  MRI of cervical spine showed mild degenerative changes, no significant canal foraminal narrowing  Laboratory evaluation showed LDL 83, total cholesterol 416, negative HIV, TSH, A1c 6.3,   She was given  tapering dose of prednisone, over the past few weeks she had significant recovery, but still has watery left eye, mild weakness of left side,  PHYSICAL EXAM:   Vitals:   04/20/22 1110 04/20/22 1113  BP: (!) 168/113 (!) 150/105  Pulse: 90 87  Weight: 215 lb (97.5 kg)   Height: 5' 7.75" (1.721 m)    Not recorded     Body mass index is 32.93 kg/m.  PHYSICAL EXAMNIATION:  Gen: NAD, conversant, well nourised, well groomed                     Cardiovascular: Regular rate rhythm, no peripheral edema, warm, nontender. Eyes: Conjunctivae clear without exudates or hemorrhage Neck: Supple, no carotid bruits. Pulmonary: Clear to auscultation bilaterally   NEUROLOGICAL EXAM:  MENTAL STATUS: Speech/cognition: Awake, alert, oriented to history taking and casual conversation CRANIAL NERVES: CN II: Visual fields are full to confrontation. Pupils are round equal and briskly reactive to light. CN III, IV, VI: extraocular movement are normal. No ptosis. CN V: Facial sensation is intact to light touch, corneal reflexes are present bilaterally CN VII: Mild asymmetry on the right frontalis movement, she can close her left eye with effort, but still has a slit open, mild to moderate weakness of left lower face muscle CN VIII: Hearing is normal to causal conversation. CN IX, X: Phonation is normal. CN XI: Head turning and shoulder shrug are intact  MOTOR: There is no pronator drift of out-stretched arms. Muscle bulk and tone are normal. Muscle strength is normal.  REFLEXES: Reflexes are 2+ and symmetric at the  biceps, triceps, knees, and ankles. Plantar responses are flexor.  SENSORY: Intact to light touch, pinprick and vibratory sensation are intact in fingers and toes.  COORDINATION: There is no trunk or limb dysmetria noted.  GAIT/STANCE: Posture is normal. Gait is steady with normal steps, base, arm swing, and turning. Heel and toe walking are normal. Tandem gait is normal.  Romberg  is absent.  REVIEW OF SYSTEMS:  Full 14 system review of systems performed and notable only for as above All other review of systems were negative.   ALLERGIES: Allergies  Allergen Reactions   Other Other (See Comments)    Catgut stitches causes water blisters    HOME MEDICATIONS: Current Outpatient Medications  Medication Sig Dispense Refill   acetaminophen (TYLENOL) 325 MG tablet Take 2 tablets (650 mg total) by mouth every 4 (four) hours as needed for mild pain (or temp > 37.5 C (99.5 F)).     albuterol (VENTOLIN HFA) 108 (90 Base) MCG/ACT inhaler Inhale 2 puffs into the lungs every 6 (six) hours as needed for wheezing or shortness of breath. 18 g 3   Budeson-Glycopyrrol-Formoterol (BREZTRI AEROSPHERE) 160-9-4.8 MCG/ACT AERO Inhale 2 puffs into the lungs in the morning and at bedtime. 10.7 g 5   FASENRA PEN 30 MG/ML SOAJ INJECT 1 PEN UNDER THE SKIN AT WEEK 0, 4 AND 8, THEN INJECT 1 PEN EVERY 8 WEEKS THEREAFTER. (Patient taking differently: Inject 1 pen under the skin at week 0, 4 and 8, then inject 1 pen every 8 weeks thereafter.) 1 mL 7   fluticasone (FLONASE) 50 MCG/ACT nasal spray SPRAY 2 SPRAYS INTO EACH NOSTRIL EVERY DAY (Patient taking differently: Place 2 sprays into both nostrils daily as needed for allergies.) 48 mL 0   loratadine (CLARITIN) 10 MG tablet TAKE 1 TABLET BY MOUTH EVERY DAY (Patient taking differently: Take 10 mg by mouth daily.) 30 tablet 5   losartan (COZAAR) 50 MG tablet Take 1 tablet (50 mg total) by mouth daily. 90 tablet 1   montelukast (SINGULAIR) 10 MG tablet TAKE 1 TABLET BY MOUTH EVERYDAY AT BEDTIME; NEEDS APPT FOR REFILLS (Patient taking differently: Take 10 mg by mouth at bedtime. TAKE 1 TABLET BY MOUTH EVERYDAY AT BEDTIME; NEEDS APPT FOR REFILLS) 30 tablet 0   omeprazole (PRILOSEC OTC) 20 MG tablet Take 20 mg by mouth daily.     pravastatin (PRAVACHOL) 20 MG tablet TAKE 1 TABLET BY MOUTH EVERY DAY (Patient taking differently: Take 20 mg by mouth daily.)  90 tablet 1   rOPINIRole (REQUIP) 0.5 MG tablet TAKE 1/2 TO 1 TABLET BY MOUTH AT BEDTIME. 30 tablet 1   sertraline (ZOLOFT) 50 MG tablet Start taking 1/2 tab daily and increase to whole tab when taking Effexor 37.5 mg every other day. 30 tablet 1   Current Facility-Administered Medications  Medication Dose Route Frequency Provider Last Rate Last Admin   Benralizumab SOSY 30 mg  30 mg Subcutaneous Q28 days Alfonse Spruce, MD   30 mg at 03/03/21 1532    PAST MEDICAL HISTORY: Past Medical History:  Diagnosis Date   Allergy    Asthma    Depression    Diabetes mellitus 2006   GERD (gastroesophageal reflux disease)    Hypertension     PAST SURGICAL HISTORY: Past Surgical History:  Procedure Laterality Date   APPENDECTOMY  1st grade   CESAREAN SECTION  2008   CHOLECYSTECTOMY  11/11/2011   Procedure: LAPAROSCOPIC CHOLECYSTECTOMY WITH INTRAOPERATIVE CHOLANGIOGRAM;  Surgeon: Jetty Duhamel, MD;  Location: [facility] OR;  Service: General;  Laterality: N/A;   TONSILLECTOMY  6th grade    FAMILY HISTORY: Family History  Problem Relation Age of Onset   Hyperlipidemia Mother    Hypertension Father    Tongue cancer Maternal Grandfather    Prostate cancer Paternal Uncle    Kidney cancer Maternal Uncle    Heart disease Brother    Hypertension Brother    Eczema Son    Allergic rhinitis Neg Hx    Angioedema Neg Hx    Urticaria Neg Hx     SOCIAL HISTORY: Social History   Socioeconomic History   Marital status: Married    Spouse name: Not on file   Number of children: Not on file   Years of education: Not on file   Highest education level: Not on file  Occupational History   Occupation: Works at [employer]  Tobacco Use   Smoking status: Former    Packs/day: 0.50    Years: 4.00    Total pack years: 2.00    Types: Cigarettes    Quit date: 02/20/2014    Years since quitting: 8.1   Smokeless tobacco: Never  Vaping Use   Vaping Use: Never used  Substance and Sexual Activity    Alcohol use: No    Alcohol/week: 0.0 standard drinks of alcohol   Drug use: No   Sexual activity: Yes    Birth control/protection: None  Other Topics Concern   Not on file  Social History Narrative   Not on file   Social Determinants of Health   Financial Resource Strain: Not on file  Food Insecurity: Not on file  Transportation Needs: Not on file  Physical Activity: Not on file  Stress: Not on file  Social Connections: Not on file  Intimate Partner Violence: Not on file      Marcial Pacas, M.D. Ph.D.  [facility] [address], [place], [place] [address] Ph: [phone] Fax: [phone]  CC:  Irine Seal, MD [address] [place] [address]  [place], Betty G, MD

## 2022-04-26 ENCOUNTER — Other Ambulatory Visit: Payer: Self-pay | Admitting: Family Medicine

## 2022-04-26 DIAGNOSIS — F419 Anxiety disorder, unspecified: Secondary | ICD-10-CM

## 2022-04-26 DIAGNOSIS — E1169 Type 2 diabetes mellitus with other specified complication: Secondary | ICD-10-CM

## 2022-04-26 DIAGNOSIS — J4521 Mild intermittent asthma with (acute) exacerbation: Secondary | ICD-10-CM

## 2022-05-06 ENCOUNTER — Other Ambulatory Visit: Payer: Self-pay | Admitting: Family Medicine

## 2022-05-06 DIAGNOSIS — G2581 Restless legs syndrome: Secondary | ICD-10-CM

## 2022-05-06 DIAGNOSIS — I10 Essential (primary) hypertension: Secondary | ICD-10-CM

## 2022-05-09 NOTE — Progress Notes (Deleted)
HPI:  Terri Richardson is a 51 y.o. female, who is here today to follow on recent visit.   Hyperlipidemia: Currently on *** Following a low fat diet: ***. Side effects from medication:*** Lab Results  Component Value Date   CHOL 151 02/14/2022   HDL 50 02/14/2022   LDLCALC 80 02/14/2022   TRIG 103 02/14/2022   CHOLHDL 3.0 02/14/2022    Hypertension:  Medications:*** BP readings at home:*** Side effects:***  Negative for unusual or severe headache, visual changes, exertional chest pain, dyspnea,  focal weakness, or edema.  Lab Results  Component Value Date   CREATININE 0.90 02/15/2022   BUN 16 02/15/2022   NA 137 02/15/2022   K 3.6 02/15/2022   CL 106 02/15/2022   CO2 24 02/15/2022    Diabetes Mellitus II:  - Checking BG at home: *** - Medications: *** - Compliance: *** - Diet: *** - Exercise: *** - eye exam: *** - foot exam: *** - Negative for symptoms of hypoglycemia, polyuria, polydipsia, numbness extremities, foot ulcers/trauma  Lab Results  Component Value Date   HGBA1C 6.3 (H) 02/14/2022   Lab Results  Component Value Date   MICROALBUR 3.1 (H) 03/10/2022     Review of Systems Rest see pertinent positives and negatives per HPI.  Current Outpatient Medications on File Prior to Visit  Medication Sig Dispense Refill   acetaminophen (TYLENOL) 325 MG tablet Take 2 tablets (650 mg total) by mouth every 4 (four) hours as needed for mild pain (or temp > 37.5 C (99.5 F)).     albuterol (VENTOLIN HFA) 108 (90 Base) MCG/ACT inhaler Inhale 2 puffs into the lungs every 6 (six) hours as needed for wheezing or shortness of breath. 18 g 3   Budeson-Glycopyrrol-Formoterol (BREZTRI AEROSPHERE) 160-9-4.8 MCG/ACT AERO Inhale 2 puffs into the lungs in the morning and at bedtime. 10.7 g 5   FASENRA PEN 30 MG/ML SOAJ INJECT 1 PEN UNDER THE SKIN AT WEEK 0, 4 AND 8, THEN INJECT 1 PEN EVERY 8 WEEKS THEREAFTER. (Patient taking differently: Inject 1 pen under the skin  at week 0, 4 and 8, then inject 1 pen every 8 weeks thereafter.) 1 mL 7   fluticasone (FLONASE) 50 MCG/ACT nasal spray SPRAY 2 SPRAYS INTO EACH NOSTRIL EVERY DAY (Patient taking differently: Place 2 sprays into both nostrils daily as needed for allergies.) 48 mL 0   loratadine (CLARITIN) 10 MG tablet TAKE 1 TABLET BY MOUTH EVERY DAY (Patient taking differently: Take 10 mg by mouth daily.) 30 tablet 5   losartan (COZAAR) 50 MG tablet Take 1 tablet (50 mg total) by mouth daily. 90 tablet 1   montelukast (SINGULAIR) 10 MG tablet TAKE 1 TABLET BY MOUTH EVERYDAY AT BEDTIME 90 tablet 1   omeprazole (PRILOSEC OTC) 20 MG tablet Take 20 mg by mouth daily.     pravastatin (PRAVACHOL) 20 MG tablet TAKE 1 TABLET BY MOUTH EVERY DAY 90 tablet 1   rOPINIRole (REQUIP) 0.5 MG tablet TAKE 1/2-1 TABLET BY MOUTH AT BEDTIME 30 tablet 1   sertraline (ZOLOFT) 50 MG tablet Take 1 tablet (50 mg total) by mouth daily. 90 tablet 1   Current Facility-Administered Medications on File Prior to Visit  Medication Dose Route Frequency Provider Last Rate Last Admin   Benralizumab SOSY 30 mg  30 mg Subcutaneous Q28 days Alfonse Spruce, MD   30 mg at 03/03/21 1532    Past Medical History:  Diagnosis Date   Allergy  Asthma    Depression    Diabetes mellitus 2006   GERD (gastroesophageal reflux disease)    Hypertension    Allergies  Allergen Reactions   Other Other (See Comments)    Catgut stitches causes water blisters    Social History   Socioeconomic History   Marital status: Married    Spouse name: Not on file   Number of children: Not on file   Years of education: Not on file   Highest education level: Not on file  Occupational History   Occupation: Works at [employer]  Tobacco Use   Smoking status: Former    Packs/day: 0.50    Years: 4.00    Total pack years: 2.00    Types: Cigarettes    Quit date: 02/20/2014    Years since quitting: 8.2   Smokeless tobacco: Never  Vaping Use   Vaping Use:  Never used  Substance and Sexual Activity   Alcohol use: No    Alcohol/week: 0.0 standard drinks of alcohol   Drug use: No   Sexual activity: Yes    Birth control/protection: None  Other Topics Concern   Not on file  Social History Narrative   Not on file   Social Determinants of Health   Financial Resource Strain: Not on file  Food Insecurity: Not on file  Transportation Needs: Not on file  Physical Activity: Not on file  Stress: Not on file  Social Connections: Not on file    There were no vitals filed for this visit. There is no height or weight on file to calculate BMI.  Physical Exam  ASSESSMENT AND PLAN:   There are no diagnoses linked to this encounter.  No orders of the defined types were placed in this encounter.   No problem-specific Assessment & Plan notes found for this encounter.   No follow-ups on file.   Betty G. [place], MD  [facility]. [place] office.

## 2022-05-10 ENCOUNTER — Ambulatory Visit: Payer: BC Managed Care – PPO | Admitting: Family Medicine

## 2022-05-11 ENCOUNTER — Other Ambulatory Visit: Payer: Self-pay | Admitting: *Deleted

## 2022-05-11 MED ORDER — FASENRA PEN 30 MG/ML ~~LOC~~ SOAJ: 30.0000 mg | SUBCUTANEOUS | 6 refills | Status: DC

## 2022-06-09 ENCOUNTER — Other Ambulatory Visit: Payer: Self-pay | Admitting: Family Medicine

## 2022-06-09 DIAGNOSIS — G2581 Restless legs syndrome: Secondary | ICD-10-CM

## 2022-06-12 ENCOUNTER — Other Ambulatory Visit: Payer: Self-pay

## 2022-06-12 DIAGNOSIS — G2581 Restless legs syndrome: Secondary | ICD-10-CM

## 2022-06-12 MED ORDER — ROPINIROLE HCL 0.5 MG PO TABS: 0.2500 mg | ORAL_TABLET | Freq: Every day | ORAL | 1 refills | Status: DC

## 2022-06-21 ENCOUNTER — Other Ambulatory Visit: Payer: Self-pay | Admitting: Allergy & Immunology

## 2022-06-30 NOTE — Progress Notes (Unsigned)
HPI: [name] is a 51 y.o. female, who is here today for follow-up. Last visit on 03/10/2022. Since her last visit she has seen neurologist and immunologist. Bell's Patsy symptoms affecting left side area gradually improving.    Diabetes Mellitus II: Diagnosed around 2006. She is on nonpharmacologic treatment. She was on metformin in the past.  - Checking BG at home: Not checking. - eye exam: Over a year ago. - foot exam: 02/2022. - Negative for symptoms of hypoglycemia, polyuria, polydipsia, numbness extremities, foot ulcers/trauma  Lab Results  Component Value Date   HGBA1C 6.3 (H) 02/14/2022   Lab Results  Component Value Date   MICROALBUR 3.1 (H) 03/10/2022   Hypertension:  Medications: Losartan 50 mg daily, dose was increased last visit. Negative for unusual or severe headache, visual changes, exertional chest pain, dyspnea,  focal weakness, or edema.  Lab Results  Component Value Date   CREATININE 0.90 02/15/2022   BUN 16 02/15/2022   NA 137 02/15/2022   K 3.6 02/15/2022   CL 106 02/15/2022   CO2 24 02/15/2022   Anxiety and depression: Currently she is on sertraline, she increased dose from 50 mg to 100 mg about 5 weeks ago.  She does not feel the medication is helping. She feels her depression is mild, her major problem is anxiety. In the past she has tried Effexor and Paxil. Sleeps about 6-7 hours. She does not feel rested when she first gets up in the morning. + Loud snoring. No known history of sleep apnea.     07/03/2022    3:36 PM 03/10/2022    8:03 AM 09/28/2016    1:05 PM  Depression screen PHQ 2/9  Decreased Interest 0 3 1  Down, Depressed, Hopeless 0 3 1  PHQ - 2 Score 0 6 2  Altered sleeping 3 3 1   Tired, decreased energy 3 3 1   Change in appetite 3 0 0  Feeling bad or failure about yourself  0 0 0  Trouble concentrating 0 0 1  Moving slowly or fidgety/restless 0 0 0  Suicidal thoughts 0 0 0  PHQ-9 Score 9 12 5   Difficult doing  work/chores Somewhat difficult Extremely dIfficult Not difficult at all   RLS: Last visit Requip 0.5 mg was started.  Medication has helped with leg discomfort. She has tolerated medication well.  Review of Systems  Constitutional:  Positive for fatigue. Negative for appetite change and fever.  HENT:  Negative for mouth sores, nosebleeds and sore throat.   Respiratory:  Negative for cough and wheezing.   Gastrointestinal:  Negative for abdominal pain, nausea and vomiting.       Negative for changes in bowel habits.  Genitourinary:  Negative for decreased urine volume and hematuria.  Skin:  Negative for rash.  Neurological:  Negative for tremors and syncope.  Psychiatric/Behavioral:  Negative for confusion and hallucinations. The patient is nervous/anxious.   Rest see pertinent positives and negatives per HPI.  Current Outpatient Medications on File Prior to Visit  Medication Sig Dispense Refill   acetaminophen (TYLENOL) 325 MG tablet Take 2 tablets (650 mg total) by mouth every 4 (four) hours as needed for mild pain (or temp > 37.5 C (99.5 F)).     albuterol (VENTOLIN HFA) 108 (90 Base) MCG/ACT inhaler Inhale 2 puffs into the lungs every 6 (six) hours as needed for wheezing or shortness of breath. 18 g 3   Benralizumab (FASENRA PEN) 30 MG/ML SOAJ Inject 1 mL (30 mg total)  into the skin every 8 (eight) weeks. 1 mL 6   Budeson-Glycopyrrol-Formoterol (BREZTRI AEROSPHERE) 160-9-4.8 MCG/ACT AERO Inhale 2 puffs into the lungs in the morning and at bedtime. 10.7 g 5   fluticasone (FLONASE) 50 MCG/ACT nasal spray SPRAY 2 SPRAYS INTO EACH NOSTRIL EVERY DAY (Patient taking differently: Place 2 sprays into both nostrils daily as needed for allergies.) 48 mL 0   loratadine (CLARITIN) 10 MG tablet TAKE 1 TABLET BY MOUTH EVERY DAY 90 tablet 1   losartan (COZAAR) 50 MG tablet Take 1 tablet (50 mg total) by mouth daily. 90 tablet 1   montelukast (SINGULAIR) 10 MG tablet TAKE 1 TABLET BY MOUTH EVERYDAY AT  BEDTIME 90 tablet 1   omeprazole (PRILOSEC OTC) 20 MG tablet Take 20 mg by mouth daily.     pravastatin (PRAVACHOL) 20 MG tablet TAKE 1 TABLET BY MOUTH EVERY DAY 90 tablet 1   rOPINIRole (REQUIP) 0.5 MG tablet Take 0.5-1 tablets (0.25-0.5 mg total) by mouth at bedtime. 90 tablet 1   Current Facility-Administered Medications on File Prior to Visit  Medication Dose Route Frequency Provider Last Rate Last Admin   Benralizumab SOSY 30 mg  30 mg Subcutaneous Q28 days Alfonse Spruce, MD   30 mg at 03/03/21 1532   Past Medical History:  Diagnosis Date   Allergy    Asthma    Depression    Diabetes mellitus 2006   GERD (gastroesophageal reflux disease)    Hypertension    Allergies  Allergen Reactions   Other Other (See Comments)    Catgut stitches causes water blisters   Social History   Socioeconomic History   Marital status: Married    Spouse name: Not on file   Number of children: Not on file   Years of education: Not on file   Highest education level: Not on file  Occupational History   Occupation: Works at [employer]  Tobacco Use   Smoking status: Former    Packs/day: 0.50    Years: 4.00    Total pack years: 2.00    Types: Cigarettes    Quit date: 02/20/2014    Years since quitting: 8.3   Smokeless tobacco: Never  Vaping Use   Vaping Use: Never used  Substance and Sexual Activity   Alcohol use: No    Alcohol/week: 0.0 standard drinks of alcohol   Drug use: No   Sexual activity: Yes    Birth control/protection: None  Other Topics Concern   Not on file  Social History Narrative   Not on file   Social Determinants of Health   Financial Resource Strain: Not on file  Food Insecurity: Not on file  Transportation Needs: Not on file  Physical Activity: Not on file  Stress: Not on file  Social Connections: Not on file   Vitals:   07/03/22 1506  BP: 120/80  Pulse: 91  Resp: 12  SpO2: 97%  Body mass index is 34.16 kg/m.  Physical Exam Vitals and nursing  note reviewed.  Constitutional:      General: She is not in acute distress.    Appearance: She is well-developed.  HENT:     Head: Normocephalic and atraumatic.     Mouth/Throat:     Mouth: Mucous membranes are moist.     Pharynx: Oropharynx is clear.  Eyes:     Conjunctiva/sclera: Conjunctivae normal.  Cardiovascular:     Rate and Rhythm: Normal rate and regular rhythm.     Pulses:  Dorsalis pedis pulses are 2+ on the right side and 2+ on the left side.     Heart sounds: No murmur heard. Pulmonary:     Effort: Pulmonary effort is normal. No respiratory distress.     Breath sounds: Normal breath sounds.  Abdominal:     Palpations: Abdomen is soft. There is no hepatomegaly or mass.     Tenderness: There is no abdominal tenderness.  Lymphadenopathy:     Cervical: No cervical adenopathy.  Skin:    General: Skin is warm.     Findings: No erythema or rash.  Neurological:     General: No focal deficit present.     Mental Status: She is alert and oriented to person, place, and time.     Cranial Nerves: No cranial nerve deficit.     Gait: Gait normal.  Psychiatric:     Comments: Well groomed, good eye contact.   ASSESSMENT AND PLAN:  [name] was seen today for follow-up.  Diagnoses and all orders for this visit: Orders Placed This Encounter  Procedures   Ambulatory referral to Sleep Studies   POC HgB A1c   Lab Results  Component Value Date   HGBA1C 6.4 07/03/2022   RLS (restless legs syndrome) Problem has improved. Continue Requip 0.5 mg daily as needed.  Essential hypertension BP adequately controlled. Continue losartan 50 mg daily and low-salt diet. Eye exam is overdue. Monitor BP regularly.  Type 2 diabetes mellitus (HCC) HgA1C at goal. Continue nonpharmacologic treatment. Annual eye exam, periodic dental and foot care recommended. F/U in 5-6 months  Anxiety disorder Problem has improved but is still not well controlled. We discussed other  treatment options. She agrees with changing sertraline for fluoxetine 40 mg daily. She has tried CBT, has some resources given but did not feel like it was very beneficial. F/U in 3 months, before if needed.  Daytime hypersomnia + Loud snoring. She has some risk factors for OSA. Sleep study will be arranged.  Health maintenance Colon cancer screening is overdue, she did not make a decision today about colonoscopy, cologuard,or FIT. Declined Tdap.  Return in about 3 months (around 10/02/2022).  Princesa Willig G. [place], MD  [facility]. [place] office.

## 2022-07-03 ENCOUNTER — Ambulatory Visit (INDEPENDENT_AMBULATORY_CARE_PROVIDER_SITE_OTHER): Payer: BC Managed Care – PPO | Admitting: Family Medicine

## 2022-07-03 ENCOUNTER — Encounter: Payer: Self-pay | Admitting: Family Medicine

## 2022-07-03 VITALS — BP 120/80 | HR 91 | Resp 12 | Ht 67.75 in | Wt 223.0 lb

## 2022-07-03 DIAGNOSIS — I10 Essential (primary) hypertension: Secondary | ICD-10-CM

## 2022-07-03 DIAGNOSIS — G2581 Restless legs syndrome: Secondary | ICD-10-CM | POA: Insufficient documentation

## 2022-07-03 DIAGNOSIS — E1169 Type 2 diabetes mellitus with other specified complication: Secondary | ICD-10-CM

## 2022-07-03 DIAGNOSIS — G4710 Hypersomnia, unspecified: Secondary | ICD-10-CM

## 2022-07-03 DIAGNOSIS — F419 Anxiety disorder, unspecified: Secondary | ICD-10-CM

## 2022-07-03 LAB — POCT GLYCOSYLATED HEMOGLOBIN (HGB A1C): HbA1c, POC (prediabetic range): 6.4 % (ref 5.7–6.4)

## 2022-07-03 MED ORDER — FLUOXETINE HCL 40 MG PO CAPS: 40.0000 mg | ORAL_CAPSULE | Freq: Every day | ORAL | 3 refills | Status: DC

## 2022-07-03 NOTE — Patient Instructions (Signed)
A few things to remember from today's visit:   Type 2 diabetes mellitus with other specified complication, without long-term current use of insulin (HCC)  Essential hypertension  Anxiety disorder, unspecified type - Plan: FLUoxetine (PROZAC) 40 MG capsule  Daytime hypersomnia - Plan: Ambulatory referral to Sleep Studies  If you need refills please call your pharmacy. Do not use My Chart to request refills or for acute issues that need immediate attention.   Please be sure medication list is accurate. If a new problem present, please set up appointment sooner than planned today.  Please arrange an eye exam. Fluoxetine 40 mg started today and stop Sertraline.  Diabetes Mellitus and Nutrition, Adult When you have diabetes, or diabetes mellitus, it is very important to have healthy eating habits because your blood sugar (glucose) levels are greatly affected by what you eat and drink. Eating healthy foods in the right amounts, at about the same times every day, can help you: Manage your blood glucose. Lower your risk of heart disease. Improve your blood pressure. Reach or maintain a healthy weight. What can affect my meal plan? Every person with diabetes is different, and each person has different needs for a meal plan. Your health care provider may recommend that you work with a dietitian to make a meal plan that is best for you. Your meal plan may vary depending on factors such as: The calories you need. The medicines you take. Your weight. Your blood glucose, blood pressure, and cholesterol levels. Your activity level. Other health conditions you have, such as heart or kidney disease. How do carbohydrates affect me? Carbohydrates, also called carbs, affect your blood glucose level more than any other type of food. Eating carbs raises the amount of glucose in your blood. It is important to know how many carbs you can safely have in each meal. This is different for every person. Your  dietitian can help you calculate how many carbs you should have at each meal and for each snack. How does alcohol affect me? Alcohol can cause a decrease in blood glucose (hypoglycemia), especially if you use insulin or take certain diabetes medicines by mouth. Hypoglycemia can be a life-threatening condition. Symptoms of hypoglycemia, such as sleepiness, dizziness, and confusion, are similar to symptoms of having too much alcohol. Do not drink alcohol if: Your health care provider tells you not to drink. You are pregnant, may be pregnant, or are planning to become pregnant. If you drink alcohol: Limit how much you have to: 0-1 drink a day for women. 0-2 drinks a day for men. Know how much alcohol is in your drink. In the [place] one drink equals one 12 oz bottle of beer (355 mL), one 5 oz glass of wine (148 mL), or one 1 oz glass of hard liquor (44 mL). Keep yourself hydrated with water, diet soda, or unsweetened iced tea. Keep in mind that regular soda, juice, and other mixers may contain a lot of sugar and must be counted as carbs. What are tips for following this plan?  Reading food labels Start by checking the serving size on the Nutrition Facts label of packaged foods and drinks. The number of calories and the amount of carbs, fats, and other nutrients listed on the label are based on one serving of the item. Many items contain more than one serving per package. Check the total grams (g) of carbs in one serving. Check the number of grams of saturated fats and trans fats in one serving. Choose  foods that have a low amount or none of these fats. Check the number of milligrams (mg) of salt (sodium) in one serving. Most people should limit total sodium intake to less than 2,300 mg per day. Always check the nutrition information of foods labeled as "low-fat" or "nonfat." These foods may be higher in added sugar or refined carbs and should be avoided. Talk to your dietitian to identify your daily  goals for nutrients listed on the label. Shopping Avoid buying canned, pre-made, or processed foods. These foods tend to be high in fat, sodium, and added sugar. Shop around the outside edge of the grocery store. This is where you will most often find fresh fruits and vegetables, bulk grains, fresh meats, and fresh dairy products. Cooking Use low-heat cooking methods, such as baking, instead of high-heat cooking methods, such as deep frying. Cook using healthy oils, such as olive, canola, or sunflower oil. Avoid cooking with butter, cream, or high-fat meats. Meal planning Eat meals and snacks regularly, preferably at the same times every day. Avoid going long periods of time without eating. Eat foods that are high in fiber, such as fresh fruits, vegetables, beans, and whole grains. Eat 4-6 oz (112-168 g) of lean protein each day, such as lean meat, chicken, fish, eggs, or tofu. One ounce (oz) (28 g) of lean protein is equal to: 1 oz (28 g) of meat, chicken, or fish. 1 egg.  cup (62 g) of tofu. Eat some foods each day that contain healthy fats, such as avocado, nuts, seeds, and fish. What foods should I eat? Fruits Berries. Apples. Oranges. Peaches. Apricots. Plums. Grapes. Mangoes. Papayas. Pomegranates. Kiwi. Cherries. Vegetables Leafy greens, including lettuce, spinach, kale, chard, collard greens, mustard greens, and cabbage. Beets. Cauliflower. Broccoli. Carrots. Green beans. Tomatoes. Peppers. Onions. Cucumbers. Brussels sprouts. Grains Whole grains, such as whole-wheat or whole-grain bread, crackers, tortillas, cereal, and pasta. Unsweetened oatmeal. Quinoa. Brown or wild rice. Meats and other proteins Seafood. Poultry without skin. Lean cuts of poultry and beef. Tofu. Nuts. Seeds. Dairy Low-fat or fat-free dairy products such as milk, yogurt, and cheese. The items listed above may not be a complete list of foods and beverages you can eat and drink. Contact a dietitian for more  information. What foods should I avoid? Fruits Fruits canned with syrup. Vegetables Canned vegetables. Frozen vegetables with butter or cream sauce. Grains Refined white flour and flour products such as bread, pasta, snack foods, and cereals. Avoid all processed foods. Meats and other proteins Fatty cuts of meat. Poultry with skin. Breaded or fried meats. Processed meat. Avoid saturated fats. Dairy Full-fat yogurt, cheese, or milk. Beverages Sweetened drinks, such as soda or iced tea. The items listed above may not be a complete list of foods and beverages you should avoid. Contact a dietitian for more information. Questions to ask a health care provider Do I need to meet with a certified diabetes care and education specialist? Do I need to meet with a dietitian? What number can I call if I have questions? When are the best times to check my blood glucose? Where to find more information: American Diabetes Association: diabetes.org Academy of Nutrition and Dietetics: eatright.[employer] of Diabetes and Digestive and Kidney Diseases: StageSync.si Association of Diabetes Care & Education Specialists: diabeteseducator.org Summary It is important to have healthy eating habits because your blood sugar (glucose) levels are greatly affected by what you eat and drink. It is important to use alcohol carefully. A healthy meal plan will help  you manage your blood glucose and lower your risk of heart disease. Your health care provider may recommend that you work with a dietitian to make a meal plan that is best for you. This information is not intended to replace advice given to you by your health care provider. Make sure you discuss any questions you have with your health care provider. Document Revised: 05/12/2020 Document Reviewed: 05/12/2020 Elsevier Patient Education  2023 [employer].

## 2022-07-03 NOTE — Assessment & Plan Note (Addendum)
BP adequately controlled. Continue losartan 50 mg daily and low-salt diet. Eye exam is overdue. Monitor BP regularly.

## 2022-07-03 NOTE — Assessment & Plan Note (Signed)
HgA1C at goal. Continue non pharmacologic treatment. Annual eye exam, periodic dental and foot care recommended. F/U in 5-6 months. 

## 2022-07-03 NOTE — Assessment & Plan Note (Signed)
Problem has improved. Continue Requip 0.5 mg daily as needed.

## 2022-07-03 NOTE — Assessment & Plan Note (Signed)
Problem has improved but is still not well controlled. We discussed other treatment options. She agrees with changing sertraline for fluoxetine 40 mg daily. She has tried CBT, has some resources given but did not feel like it was very beneficial. F/U in 3 months, before if needed.

## 2022-08-15 ENCOUNTER — Other Ambulatory Visit: Payer: Self-pay | Admitting: Family Medicine

## 2022-08-15 DIAGNOSIS — I10 Essential (primary) hypertension: Secondary | ICD-10-CM

## 2022-09-27 ENCOUNTER — Ambulatory Visit: Payer: BC Managed Care – PPO | Admitting: Allergy & Immunology

## 2022-09-29 NOTE — Progress Notes (Deleted)
HPI:  Terri Richardson is a 51 y.o. female, who is here today to follow on recent visit.  Review of Systems See other pertinent positives and negatives in HPI.  Current Outpatient Medications on File Prior to Visit  Medication Sig Dispense Refill   acetaminophen (TYLENOL) 325 MG tablet Take 2 tablets (650 mg total) by mouth every 4 (four) hours as needed for mild pain (or temp > 37.5 C (99.5 F)).     albuterol (VENTOLIN HFA) 108 (90 Base) MCG/ACT inhaler Inhale 2 puffs into the lungs every 6 (six) hours as needed for wheezing or shortness of breath. 18 g 3   Benralizumab (FASENRA PEN) 30 MG/ML SOAJ Inject 1 mL (30 mg total) into the skin every 8 (eight) weeks. 1 mL 6   Budeson-Glycopyrrol-Formoterol (BREZTRI AEROSPHERE) 160-9-4.8 MCG/ACT AERO Inhale 2 puffs into the lungs in the morning and at bedtime. 10.7 g 5   FLUoxetine (PROZAC) 40 MG capsule Take 1 capsule (40 mg total) by mouth daily. 90 capsule 3   fluticasone (FLONASE) 50 MCG/ACT nasal spray SPRAY 2 SPRAYS INTO EACH NOSTRIL EVERY DAY (Patient taking differently: Place 2 sprays into both nostrils daily as needed for allergies.) 48 mL 0   loratadine (CLARITIN) 10 MG tablet TAKE 1 TABLET BY MOUTH EVERY DAY 90 tablet 1   losartan (COZAAR) 50 MG tablet TAKE 1 TABLET BY MOUTH EVERY DAY 90 tablet 1   montelukast (SINGULAIR) 10 MG tablet TAKE 1 TABLET BY MOUTH EVERYDAY AT BEDTIME 90 tablet 1   omeprazole (PRILOSEC OTC) 20 MG tablet Take 20 mg by mouth daily.     pravastatin (PRAVACHOL) 20 MG tablet TAKE 1 TABLET BY MOUTH EVERY DAY 90 tablet 1   rOPINIRole (REQUIP) 0.5 MG tablet Take 0.5-1 tablets (0.25-0.5 mg total) by mouth at bedtime. 90 tablet 1   Current Facility-Administered Medications on File Prior to Visit  Medication Dose Route Frequency Provider Last Rate Last Admin   Benralizumab SOSY 30 mg  30 mg Subcutaneous Q28 days Alfonse SpruceGallagher, Joel Louis, MD   30 mg at 03/03/21 1532    Past Medical History:  Diagnosis Date   Allergy     Asthma    Depression    Diabetes mellitus 2006   GERD (gastroesophageal reflux disease)    Hypertension    Allergies  Allergen Reactions   Other Other (See Comments)    Catgut stitches causes water blisters    Social History   Socioeconomic History   Marital status: Married    Spouse name: Not on file   Number of children: Not on file   Years of education: Not on file   Highest education level: Not on file  Occupational History   Occupation: Works at [employer]  Tobacco Use   Smoking status: Former    Packs/day: 0.50    Years: 4.00    Total pack years: 2.00    Types: Cigarettes    Quit date: 02/20/2014    Years since quitting: 8.6   Smokeless tobacco: Never  Vaping Use   Vaping Use: Never used  Substance and Sexual Activity   Alcohol use: No    Alcohol/week: 0.0 standard drinks of alcohol   Drug use: No   Sexual activity: Yes    Birth control/protection: None  Other Topics Concern   Not on file  Social History Narrative   Not on file   Social Determinants of Health   Financial Resource Strain: Not on file  Food Insecurity: Not on  file  Transportation Needs: Not on file  Physical Activity: Not on file  Stress: Not on file  Social Connections: Not on file    There were no vitals filed for this visit. There is no height or weight on file to calculate BMI.  Physical Exam  ASSESSMENT AND PLAN:  There are no diagnoses linked to this encounter.  No orders of the defined types were placed in this encounter.   No problem-specific Assessment & Plan notes found for this encounter.   No follow-ups on file.  Terri G. [place], MD  [facility]. [place] office.

## 2022-09-30 ENCOUNTER — Other Ambulatory Visit: Payer: Self-pay | Admitting: Allergy & Immunology

## 2022-09-30 ENCOUNTER — Other Ambulatory Visit: Payer: Self-pay | Admitting: Family Medicine

## 2022-09-30 DIAGNOSIS — J4521 Mild intermittent asthma with (acute) exacerbation: Secondary | ICD-10-CM

## 2022-09-30 DIAGNOSIS — E1169 Type 2 diabetes mellitus with other specified complication: Secondary | ICD-10-CM

## 2022-10-02 ENCOUNTER — Ambulatory Visit: Payer: BC Managed Care – PPO | Admitting: Family Medicine

## 2022-12-01 NOTE — Progress Notes (Deleted)
HPI: [name] is a 52 y.o. female, who is here today for chronic disease management.  Last seen on ***  *** Review of Systems See other pertinent positives and negatives in HPI.  Current Outpatient Medications on File Prior to Visit  Medication Sig Dispense Refill  . acetaminophen (TYLENOL) 325 MG tablet Take 2 tablets (650 mg total) by mouth every 4 (four) hours as needed for mild pain (or temp > 37.5 C (99.5 F)).    Marland Kitchen albuterol (VENTOLIN HFA) 108 (90 Base) MCG/ACT inhaler Inhale 2 puffs into the lungs every 6 (six) hours as needed for wheezing or shortness of breath. 18 g 3  . Benralizumab (FASENRA PEN) 30 MG/ML SOAJ Inject 1 mL (30 mg total) into the skin every 8 (eight) weeks. 1 mL 6  . Budeson-Glycopyrrol-Formoterol (BREZTRI AEROSPHERE) 160-9-4.8 MCG/ACT AERO INHALE 2 PUFFS INTO THE LUNGS IN THE MORNING AND AT BEDTIME. 10.7 each 2  . FLUoxetine (PROZAC) 40 MG capsule Take 1 capsule (40 mg total) by mouth daily. 90 capsule 3  . fluticasone (FLONASE) 50 MCG/ACT nasal spray SPRAY 2 SPRAYS INTO EACH NOSTRIL EVERY DAY (Patient taking differently: Place 2 sprays into both nostrils daily as needed for allergies.) 48 mL 0  . loratadine (CLARITIN) 10 MG tablet TAKE 1 TABLET BY MOUTH EVERY DAY 90 tablet 1  . losartan (COZAAR) 50 MG tablet TAKE 1 TABLET BY MOUTH EVERY DAY 90 tablet 1  . montelukast (SINGULAIR) 10 MG tablet TAKE 1 TABLET BY MOUTH EVERYDAY AT BEDTIME 90 tablet 1  . omeprazole (PRILOSEC OTC) 20 MG tablet Take 20 mg by mouth daily.    . pravastatin (PRAVACHOL) 20 MG tablet TAKE 1 TABLET BY MOUTH EVERY DAY 90 tablet 1  . rOPINIRole (REQUIP) 0.5 MG tablet Take 0.5-1 tablets (0.25-0.5 mg total) by mouth at bedtime. 90 tablet 1   Current Facility-Administered Medications on File Prior to Visit  Medication Dose Route Frequency Provider Last Rate Last Admin  . Benralizumab SOSY 30 mg  30 mg Subcutaneous Q28 days Valentina Shaggy, MD   30 mg at 03/03/21 1532     Past Medical History:  Diagnosis Date  . Allergy   . Asthma   . Depression   . Diabetes mellitus 2006  . GERD (gastroesophageal reflux disease)   . Hypertension    Allergies  Allergen Reactions  . Other Other (See Comments)    Catgut stitches causes water blisters    Social History   Socioeconomic History  . Marital status: Married    Spouse name: Not on file  . Number of children: Not on file  . Years of education: Not on file  . Highest education level: Not on file  Occupational History  . Occupation: Works at [employer]  . Smoking status: Former    Packs/day: 0.50    Years: 4.00    Total pack years: 2.00    Types: Cigarettes    Quit date: 02/20/2014    Years since quitting: 8.7  . Smokeless tobacco: Never  Vaping Use  . Vaping Use: Never used  Substance and Sexual Activity  . Alcohol use: No    Alcohol/week: 0.0 standard drinks of alcohol  . Drug use: No  . Sexual activity: Yes    Birth control/protection: None  Other Topics Concern  . Not on file  Social History Narrative  . Not on file   Social Determinants of Health   Financial Resource Strain: Not on  file  Food Insecurity: Not on file  Transportation Needs: Not on file  Physical Activity: Not on file  Stress: Not on file  Social Connections: Not on file    There were no vitals filed for this visit. There is no height or weight on file to calculate BMI.  Physical Exam  ASSESSMENT AND PLAN:  There are no diagnoses linked to this encounter.  No orders of the defined types were placed in this encounter.   No problem-specific Assessment & Plan notes found for this encounter.   No follow-ups on file.  Betty G. [place], MD  [facility]. [place] office.

## 2022-12-04 ENCOUNTER — Ambulatory Visit: Payer: BC Managed Care – PPO | Admitting: Family Medicine

## 2022-12-06 ENCOUNTER — Ambulatory Visit: Payer: BC Managed Care – PPO | Admitting: Allergy & Immunology

## 2022-12-29 ENCOUNTER — Other Ambulatory Visit: Payer: Self-pay | Admitting: Family Medicine

## 2022-12-29 DIAGNOSIS — G2581 Restless legs syndrome: Secondary | ICD-10-CM

## 2023-01-05 ENCOUNTER — Ambulatory Visit: Payer: BC Managed Care – PPO | Admitting: Allergy & Immunology

## 2023-02-17 ENCOUNTER — Other Ambulatory Visit: Payer: Self-pay | Admitting: Family Medicine

## 2023-02-17 DIAGNOSIS — I10 Essential (primary) hypertension: Secondary | ICD-10-CM

## 2023-03-05 NOTE — Progress Notes (Unsigned)
ACUTE VISIT No chief complaint on file.  HPI: [name] is a 52 y.o. female, who is here today complaining of *** HPI  Review of Systems See other pertinent positives and negatives in HPI.  Current Outpatient Medications on File Prior to Visit  Medication Sig Dispense Refill   acetaminophen (TYLENOL) 325 MG tablet Take 2 tablets (650 mg total) by mouth every 4 (four) hours as needed for mild pain (or temp > 37.5 C (99.5 F)).     albuterol (VENTOLIN HFA) 108 (90 Base) MCG/ACT inhaler Inhale 2 puffs into the lungs every 6 (six) hours as needed for wheezing or shortness of breath. 18 g 3   Benralizumab (FASENRA PEN) 30 MG/ML SOAJ Inject 1 mL (30 mg total) into the skin every 8 (eight) weeks. 1 mL 6   Budeson-Glycopyrrol-Formoterol (BREZTRI AEROSPHERE) 160-9-4.8 MCG/ACT AERO INHALE 2 PUFFS INTO THE LUNGS IN THE MORNING AND AT BEDTIME. 10.7 each 2   FLUoxetine (PROZAC) 40 MG capsule Take 1 capsule (40 mg total) by mouth daily. 90 capsule 3   fluticasone (FLONASE) 50 MCG/ACT nasal spray SPRAY 2 SPRAYS INTO EACH NOSTRIL EVERY DAY (Patient taking differently: Place 2 sprays into both nostrils daily as needed for allergies.) 48 mL 0   loratadine (CLARITIN) 10 MG tablet TAKE 1 TABLET BY MOUTH EVERY DAY 90 tablet 1   losartan (COZAAR) 50 MG tablet TAKE 1 TABLET BY MOUTH EVERY DAY 90 tablet 1   montelukast (SINGULAIR) 10 MG tablet TAKE 1 TABLET BY MOUTH EVERYDAY AT BEDTIME 90 tablet 1   omeprazole (PRILOSEC OTC) 20 MG tablet Take 20 mg by mouth daily.     pravastatin (PRAVACHOL) 20 MG tablet TAKE 1 TABLET BY MOUTH EVERY DAY 90 tablet 1   rOPINIRole (REQUIP) 0.5 MG tablet TAKE 0.5-1 TABLETS (0.25-0.5 MG TOTAL) BY MOUTH AT BEDTIME. 90 tablet 1   Current Facility-Administered Medications on File Prior to Visit  Medication Dose Route Frequency Provider Last Rate Last Admin   Benralizumab SOSY 30 mg  30 mg Subcutaneous Q28 days Alfonse Spruce, MD   30 mg at 03/03/21 1532    Past  Medical History:  Diagnosis Date   Allergy    Asthma    Depression    Diabetes mellitus 2006   GERD (gastroesophageal reflux disease)    Hypertension    Allergies  Allergen Reactions   Other Other (See Comments)    Catgut stitches causes water blisters    Social History   Socioeconomic History   Marital status: Married    Spouse name: Not on file   Number of children: Not on file   Years of education: Not on file   Highest education level: Not on file  Occupational History   Occupation: Works at [employer]  Tobacco Use   Smoking status: Former    Packs/day: 0.50    Years: 4.00    Additional pack years: 0.00    Total pack years: 2.00    Types: Cigarettes    Quit date: 02/20/2014    Years since quitting: 9.0   Smokeless tobacco: Never  Vaping Use   Vaping Use: Never used  Substance and Sexual Activity   Alcohol use: No    Alcohol/week: 0.0 standard drinks of alcohol   Drug use: No   Sexual activity: Yes    Birth control/protection: None  Other Topics Concern   Not on file  Social History Narrative   Not on file   Social Determinants of Health  Financial Resource Strain: Not on file  Food Insecurity: Not on file  Transportation Needs: Not on file  Physical Activity: Not on file  Stress: Not on file  Social Connections: Not on file    There were no vitals filed for this visit. There is no height or weight on file to calculate BMI.  Physical Exam  ASSESSMENT AND PLAN: There are no diagnoses linked to this encounter.  No follow-ups on file.  Terri Richardson G. [place], MD  [facility]. [place] office.  Discharge Instructions   None

## 2023-03-06 ENCOUNTER — Ambulatory Visit (INDEPENDENT_AMBULATORY_CARE_PROVIDER_SITE_OTHER): Payer: BC Managed Care – PPO | Admitting: Family Medicine

## 2023-03-06 ENCOUNTER — Encounter: Payer: Self-pay | Admitting: Family Medicine

## 2023-03-06 VITALS — BP 126/80 | HR 90 | Resp 16 | Ht 67.75 in | Wt 216.1 lb

## 2023-03-06 DIAGNOSIS — N390 Urinary tract infection, site not specified: Secondary | ICD-10-CM

## 2023-03-06 DIAGNOSIS — J4521 Mild intermittent asthma with (acute) exacerbation: Secondary | ICD-10-CM | POA: Diagnosis not present

## 2023-03-06 DIAGNOSIS — E1169 Type 2 diabetes mellitus with other specified complication: Secondary | ICD-10-CM | POA: Diagnosis not present

## 2023-03-06 DIAGNOSIS — R350 Frequency of micturition: Secondary | ICD-10-CM

## 2023-03-06 DIAGNOSIS — I10 Essential (primary) hypertension: Secondary | ICD-10-CM | POA: Diagnosis not present

## 2023-03-06 LAB — POC URINALSYSI DIPSTICK (AUTOMATED)
Bilirubin, UA: NEGATIVE
Blood, UA: NEGATIVE
Glucose, UA: NEGATIVE
Ketones, UA: NEGATIVE
Nitrite, UA: NEGATIVE
Protein, UA: NEGATIVE
Spec Grav, UA: 1.01 (ref 1.010–1.025)
Urobilinogen, UA: 0.2 E.U./dL
pH, UA: 6 (ref 5.0–8.0)

## 2023-03-06 MED ORDER — PREDNISONE 20 MG PO TABS: 40.0000 mg | ORAL_TABLET | Freq: Every day | ORAL | 0 refills | Status: AC

## 2023-03-06 MED ORDER — NITROFURANTOIN MONOHYD MACRO 100 MG PO CAPS
100.0000 mg | ORAL_CAPSULE | Freq: Two times a day (BID) | ORAL | 0 refills | Status: AC
Start: 2023-03-06 — End: 2023-03-11

## 2023-03-06 NOTE — Assessment & Plan Note (Signed)
Problem has been well-controlled. Continue nonpharmacologic treatment. Because today she was started on prednisone to treat asthma exacerbation, recommend monitoring BS more frequent. Further recommendation will be given according to hemoglobin A1c result.

## 2023-03-06 NOTE — Assessment & Plan Note (Signed)
Reporting wheezing and cough for the past 2 to 3 days. Auscultation negative for Rales and rhonchi, mild wheezing. She prefers to hold on imaging for now. Prednisone 40 mg for 3 to 5 days.  We discussed some side effects. Albuterol inh 2 puff every 6 hours for a week then as needed for wheezing or shortness of breath.  Continue Breztri 2 puff twice daily. Instructed to arrange appointment with immunologist. Instructed about warning signs.

## 2023-03-06 NOTE — Patient Instructions (Addendum)
A few things to remember from today's visit:  Urinary frequency - Plan: POCT Urinalysis Dipstick (Automated), Culture, Urine, Culture, Urine  Mild asthma with exacerbation, unspecified whether persistent - Plan: predniSONE (DELTASONE) 20 MG tablet  Type 2 diabetes mellitus with other specified complication, without long-term current use of insulin (HCC) - Plan: Comprehensive metabolic panel, Hemoglobin A1c, Microalbumin / creatinine urine ratio, Microalbumin / creatinine urine ratio, Hemoglobin A1c, Comprehensive metabolic panel  Essential hypertension - Plan: Comprehensive metabolic panel, Comprehensive metabolic panel  Urinary tract infection without hematuria, site unspecified - Plan: nitrofurantoin, macrocrystal-monohydrate, (MACROBID) 100 MG capsule  Albuterol inh 2 puff every 6 hours for a week then as needed for wheezing or shortness of breath.  Prednisone with breakfast for 3-5 days. Check blood sugars more frequent.  Monitor for fever. Please arrange appt with your allergy specialist.  If you need refills for medications you take chronically, please call your pharmacy. Do not use My Chart to request refills or for acute issues that need immediate attention. If you send a my chart message, it may take a few days to be addressed, specially if I am not in the office.  Please be sure medication list is accurate. If a new problem present, please set up appointment sooner than planned today.

## 2023-03-06 NOTE — Assessment & Plan Note (Signed)
BP adequately controlled. Continue losartan 50 mg daily and low-salt diet. Monitor BP regularly.

## 2023-03-07 LAB — COMPREHENSIVE METABOLIC PANEL
ALT: 25 U/L (ref 0–35)
AST: 21 U/L (ref 0–37)
Albumin: 4.2 g/dL (ref 3.5–5.2)
Alkaline Phosphatase: 55 U/L (ref 39–117)
BUN: 15 mg/dL (ref 6–23)
CO2: 26 mEq/L (ref 19–32)
Calcium: 8.9 mg/dL (ref 8.4–10.5)
Chloride: 104 mEq/L (ref 96–112)
Creatinine, Ser: 0.87 mg/dL (ref 0.40–1.20)
GFR: 77.82 mL/min (ref >60.00)
Glucose, Bld: 77 mg/dL (ref 70–99)
Potassium: 3.8 mEq/L (ref 3.5–5.1)
Sodium: 139 mEq/L (ref 135–145)
Total Bilirubin: 0.3 mg/dL (ref 0.2–1.2)
Total Protein: 7.1 g/dL (ref 6.0–8.3)

## 2023-03-07 LAB — HEMOGLOBIN A1C: Hgb A1c MFr Bld: 6.7 % — ABNORMAL HIGH (ref 4.6–6.5)

## 2023-03-07 LAB — MICROALBUMIN / CREATININE URINE RATIO
Creatinine,U: 44.6 mg/dL
Microalb Creat Ratio: 1.6 mg/g (ref 0.0–30.0)
Microalb, Ur: <0.7 mg/dL (ref 0.0–1.9)

## 2023-03-09 LAB — URINE CULTURE
MICRO NUMBER:: 14953891
SPECIMEN QUALITY:: ADEQUATE

## 2023-05-02 ENCOUNTER — Other Ambulatory Visit: Payer: Self-pay | Admitting: Family Medicine

## 2023-05-02 DIAGNOSIS — J4521 Mild intermittent asthma with (acute) exacerbation: Secondary | ICD-10-CM

## 2023-05-14 ENCOUNTER — Other Ambulatory Visit: Payer: Self-pay | Admitting: *Deleted

## 2023-05-14 MED ORDER — FASENRA PEN 30 MG/ML ~~LOC~~ SOAJ: 30.0000 mg | SUBCUTANEOUS | 6 refills | Status: DC

## 2023-05-20 ENCOUNTER — Other Ambulatory Visit: Payer: Self-pay | Admitting: Family Medicine

## 2023-05-20 DIAGNOSIS — E1169 Type 2 diabetes mellitus with other specified complication: Secondary | ICD-10-CM

## 2023-05-30 NOTE — Progress Notes (Deleted)
ACUTE VISIT No chief complaint on file.  HPI: Terri Richardson is a 52 y.o. female, who is here today complaining of *** HPI  Review of Systems See other pertinent positives and negatives in HPI.  Current Outpatient Medications on File Prior to Visit  Medication Sig Dispense Refill  . acetaminophen (TYLENOL) 325 MG tablet Take 2 tablets (650 mg total) by mouth every 4 (four) hours as needed for mild pain (or temp > 37.5 C (99.5 F)).    Marland Kitchen albuterol (VENTOLIN HFA) 108 (90 Base) MCG/ACT inhaler Inhale 2 puffs into the lungs every 6 (six) hours as needed for wheezing or shortness of breath. 18 g 3  . benralizumab (FASENRA PEN) 30 MG/ML prefilled autoinjector Inject 1 mL (30 mg total) into the skin every 8 (eight) weeks. 1 mL 6  . Budeson-Glycopyrrol-Formoterol (BREZTRI AEROSPHERE) 160-9-4.8 MCG/ACT AERO INHALE 2 PUFFS INTO THE LUNGS IN THE MORNING AND AT BEDTIME. 10.7 each 2  . FLUoxetine (PROZAC) 40 MG capsule Take 1 capsule (40 mg total) by mouth daily. 90 capsule 3  . fluticasone (FLONASE) 50 MCG/ACT nasal spray SPRAY 2 SPRAYS INTO EACH NOSTRIL EVERY DAY (Patient taking differently: Place 2 sprays into both nostrils daily as needed for allergies.) 48 mL 0  . loratadine (CLARITIN) 10 MG tablet TAKE 1 TABLET BY MOUTH EVERY DAY 90 tablet 1  . losartan (COZAAR) 50 MG tablet TAKE 1 TABLET BY MOUTH EVERY DAY 90 tablet 1  . montelukast (SINGULAIR) 10 MG tablet TAKE 1 TABLET BY MOUTH EVERYDAY AT BEDTIME 90 tablet 1  . omeprazole (PRILOSEC OTC) 20 MG tablet Take 20 mg by mouth daily.    . pravastatin (PRAVACHOL) 20 MG tablet TAKE 1 TABLET BY MOUTH EVERY DAY 90 tablet 1  . rOPINIRole (REQUIP) 0.5 MG tablet TAKE 0.5-1 TABLETS (0.25-0.5 MG TOTAL) BY MOUTH AT BEDTIME. 90 tablet 1   Current Facility-Administered Medications on File Prior to Visit  Medication Dose Route Frequency Provider Last Rate Last Admin  . Benralizumab SOSY 30 mg  30 mg Subcutaneous Q28 days Alfonse Spruce, MD   30 mg  at 03/03/21 1532    Past Medical History:  Diagnosis Date  . Allergy   . Asthma   . Depression   . Diabetes mellitus 2006  . GERD (gastroesophageal reflux disease)   . Hypertension    Allergies  Allergen Reactions  . Other Other (See Comments)    Catgut stitches causes water blisters    Social History   Socioeconomic History  . Marital status: Married    Spouse name: Not on file  . Number of children: Not on file  . Years of education: Not on file  . Highest education level: Not on file  Occupational History  . Occupation: Works at [employer]  . Smoking status: Former    Current packs/day: 0.00    Average packs/day: 0.5 packs/day for 4.0 years (2.0 ttl pk-yrs)    Types: Cigarettes    Start date: 02/20/2010    Quit date: 02/20/2014    Years since quitting: 9.2  . Smokeless tobacco: Never  Vaping Use  . Vaping status: Never Used  Substance and Sexual Activity  . Alcohol use: No    Alcohol/week: 0.0 standard drinks of alcohol  . Drug use: No  . Sexual activity: Yes    Birth control/protection: None  Other Topics Concern  . Not on file  Social History Narrative  . Not on file   Social Determinants  of Health   Financial Resource Strain: Not on file  Food Insecurity: Not on file  Transportation Needs: Not on file  Physical Activity: Not on file  Stress: Not on file  Social Connections: Not on file    There were no vitals filed for this visit. There is no height or weight on file to calculate BMI.  Physical Exam  ASSESSMENT AND PLAN: There are no diagnoses linked to this encounter.  No follow-ups on file.  Betty G. [place], MD  [facility]. [place] office.  Discharge Instructions   None

## 2023-06-01 ENCOUNTER — Ambulatory Visit: Payer: BC Managed Care – PPO | Admitting: Family Medicine

## 2023-06-29 ENCOUNTER — Telehealth: Payer: Self-pay | Admitting: Allergy & Immunology

## 2023-06-29 NOTE — Telephone Encounter (Signed)
Left voicemail to schedule Fasenra reapproval.

## 2023-07-06 ENCOUNTER — Other Ambulatory Visit: Payer: Self-pay | Admitting: Family Medicine

## 2023-07-06 DIAGNOSIS — G2581 Restless legs syndrome: Secondary | ICD-10-CM

## 2023-07-06 DIAGNOSIS — F419 Anxiety disorder, unspecified: Secondary | ICD-10-CM

## 2023-07-12 NOTE — Progress Notes (Unsigned)
[address] [place] [place] [address] Dept: [phone]  FOLLOW UP NOTE  Patient ID: Terri Richardson, female    DOB: [birth year]  Age: 52 y.o. MRN: [id number] Date of Office Visit: 07/13/2023  Assessment  Chief Complaint: Follow-up  HPI Terri Richardson is a 52 year old female who presents to the clinic for follow-up visit.  She was last seen in this clinic on 03/24/2022 by Dr. Dellis Anes for evaluation of asthma, allergic rhinitis, and recurrent infection.    At today's visit, she reports her asthma has been well-controlled with no shortness of breath, cough, or wheeze with activity or rest.  She continues montelukast 10 mg once a day and reports that she has not used Breztri for about 6.  She continues Fasenra injections once every 8 weeks.  She reports her husband administers these with no issues.  She denies larger local reactions to Fasenra injections.  She reports a significant decrease in her symptoms of asthma while continuing on Fasenra injections.   Allergic rhinitis is reported as well-controlled with only occasional symptoms occurring in the spring and sometimes in the fall.  At this time she reports clear rhinorrhea and sneezing as the main symptoms.  She takes Claritin daily and uses Flonase as needed with relief of symptoms.  She is not currently using saline nasal rinses.  Her last environmental allergy skin testing was on 06/29/2021 and was positive to feather, tree pollen, outdoor mold, cat, and cockroach.    She denies any infection, antibiotic, or steroid use since her last visit to this clinic.  Chart review indicates blood work from 02/01/2021 with nonprotective diphtheria titers and 5 out of 23 protective pneumococcal titers.   She reports that she did not take her blood pressure medication this morning. She denies any other recent episodes of high blood pressure. She is aware that her blood pressure is elevated at today's visit and will contact her primary care  provider for further evaluation.   Her current medications are listed in the chart.  Drug Allergies:  Allergies  Allergen Reactions   Other Other (See Comments)    Catgut stitches causes water blisters    Physical Exam: BP (!) 154/106 Comment: pt will take bp medication when she gets home  Pulse 77   Temp 98.3 F (36.8 C)   Resp 18   Ht 5' 2.5" (1.588 m)   Wt 216 lb 4 oz (98.1 kg)   SpO2 95%   BMI 38.92 kg/m    Physical Exam Vitals reviewed.  Constitutional:      Appearance: Normal appearance.  HENT:     Head: Normocephalic and atraumatic.     Right Ear: Tympanic membrane normal.     Left Ear: Tympanic membrane normal.     Nose:     Comments: Bilateral naris normal.  Pharynx normal.  Ears normal.  Eyes normal.    Mouth/Throat:     Pharynx: Oropharynx is clear.  Eyes:     Conjunctiva/sclera: Conjunctivae normal.  Cardiovascular:     Rate and Rhythm: Normal rate and regular rhythm.     Heart sounds: Normal heart sounds. No murmur heard. Pulmonary:     Effort: Pulmonary effort is normal.     Breath sounds: Normal breath sounds.     Comments: Lungs clear to auscultation Musculoskeletal:        General: Normal range of motion.     Cervical back: Normal range of motion and neck supple.  Skin:  General: Skin is warm and dry.  Neurological:     Mental Status: She is alert and oriented to person, place, and time.  Psychiatric:        Mood and Affect: Mood normal.        Behavior: Behavior normal.        Thought Content: Thought content normal.        Judgment: Judgment normal.     Diagnostics: FVC 3.08 which is 96% of predicted value, FEV1 2.33 which is 90% of predicted value.  Spirometry indicates normal ventilatory function.  Assessment and Plan: 1. Severe persistent asthma without complication   2. Seasonal and perennial allergic rhinitis   3. Recurrent infections     Meds ordered this encounter  Medications   albuterol (VENTOLIN HFA) 108 (90 Base)  MCG/ACT inhaler    Sig: Inhale 2 puffs into the lungs every 6 (six) hours as needed for wheezing or shortness of breath.    Dispense:  18 g    Refill:  2   Budeson-Glycopyrrol-Formoterol (BREZTRI AEROSPHERE) 160-9-4.8 MCG/ACT AERO    Sig: For asthma flare, begin Breztri 2 puffs twice a day with a spacer for 1-2 weeks or until cough and wheeze free    Dispense:  10.7 g    Refill:  2    Do not feel.  Please place on hold.  Patient will call when needed.  Thank you.   fluticasone (FLONASE) 50 MCG/ACT nasal spray    Sig: Place 2 sprays in each nostril once a day as needed for stuffy nose    Dispense:  48 mL    Refill:  1   loratadine (CLARITIN) 10 MG tablet    Sig: Take 1 tablet (10 mg total) by mouth daily.    Dispense:  90 tablet    Refill:  1    Patient Instructions  Asthma Continue montelukast 10 mg once a day to prevent cough or wheeze Continue albuterol 2 puffs once every 4 hours as needed for cough or wheeze You may use albuterol 2 puffs 5 to 15 minutes before activity to decrease cough or wheeze Continue home administration of Fasenra once every 8 weeks for asthma control For asthma flare, begin Breztri 2 puffs twice a day with a spacer for 1-2 weeks or until cough and wheeze free  Allergic rhinitis Continue allergen avoidance measures directed toward feather, tree pollen, outdoor mold, cat, and cockroach as listed below  Continue Claritin 10 mg once a day as needed for runny nose or itch. Remember to rotate to a different antihistamine about every 3 months. Some examples of over the counter antihistamines include Zyrtec (cetirizine), Xyzal (levocetirizine), Allegra (fexofenadine), and Claritin (loratidine).  Continue Flonase 2 sprays in each nostril once a day as needed for stuffy nose Consider saline nasal rinses as needed for nasal symptoms. Use this before any medicated nasal sprays for best result Consider allergen immunotherapy if your symptoms are not well-controlled with  the treatment plan as listed above  Recurrent infection Keep track of infections, antibiotic use, and steroid use. Consider getting a PPSV23 or Prevnar 20 and Tdap  Your blood pressure was elevated at today's visit. Follow up with your primary care provider for evaluation of your blood pressure.  Call the clinic if this treatment plan is not working well for you.  Follow up in 6 months or sooner if needed.  Return in about 6 months (around 01/10/2024), or if symptoms worsen or fail to improve.  Thank you for the opportunity to care for this patient.  Please do not hesitate to contact me with questions.  Thermon Leyland, FNP [facility]

## 2023-07-12 NOTE — Patient Instructions (Addendum)
Asthma Continue montelukast 10 mg once a day to prevent cough or wheeze Continue albuterol 2 puffs once every 4 hours as needed for cough or wheeze You may use albuterol 2 puffs 5 to 15 minutes before activity to decrease cough or wheeze Continue home administration of Fasenra once every 8 weeks for asthma control For asthma flare, begin Breztri 2 puffs twice a day with a spacer for 1-2 weeks or until cough and wheeze free  Allergic rhinitis Continue allergen avoidance measures directed toward feather, tree pollen, outdoor mold, cat, and cockroach as listed below  Continue Claritin 10 mg once a day as needed for runny nose or itch. Remember to rotate to a different antihistamine about every 3 months. Some examples of over the counter antihistamines include Zyrtec (cetirizine), Xyzal (levocetirizine), Allegra (fexofenadine), and Claritin (loratidine).  Continue Flonase 2 sprays in each nostril once a day as needed for stuffy nose Consider saline nasal rinses as needed for nasal symptoms. Use this before any medicated nasal sprays for best result Consider allergen immunotherapy if your symptoms are not well-controlled with the treatment plan as listed above  Recurrent infection Keep track of infections, antibiotic use, and steroid use. Consider getting a PPSV23 or Prevnar 20 and Tdap  Your blood pressure was elevated at today's visit. Follow up with your primary care provider for evaluation of your blood pressure.  Call the clinic if this treatment plan is not working well for you.  Follow up in 6 months or sooner if needed.  Reducing Pollen Exposure The American Academy of Allergy, Asthma and Immunology suggests the following steps to reduce your exposure to pollen during allergy seasons. Do not hang sheets or clothing out to dry; pollen may collect on these items. Do not mow lawns or spend time around freshly cut grass; mowing stirs up pollen. Keep windows closed at night.  Keep car  windows closed while driving. Minimize morning activities outdoors, a time when pollen counts are usually at their highest. Stay indoors as much as possible when pollen counts or humidity is high and on windy days when pollen tends to remain in the air longer. Use air conditioning when possible.  Many air conditioners have filters that trap the pollen spores. Use a HEPA room air filter to remove pollen form the indoor air you breathe.  Control of Mold Allergen Mold and fungi can grow on a variety of surfaces provided certain temperature and moisture conditions exist.  Outdoor molds grow on plants, decaying vegetation and soil.  The major outdoor mold, Alternaria and Cladosporium, are found in very high numbers during hot and dry conditions.  Generally, a late Summer - Fall peak is seen for common outdoor fungal spores.  Rain will temporarily lower outdoor mold spore count, but counts rise rapidly when the rainy period ends.  The most important indoor molds are Aspergillus and Penicillium.  Dark, humid and poorly ventilated basements are ideal sites for mold growth.  The next most common sites of mold growth are the bathroom and the kitchen.  Outdoor [employer] Use air conditioning and keep windows closed Avoid exposure to decaying vegetation. Avoid leaf raking. Avoid grain handling. Consider wearing a face mask if working in moldy areas.  Indoor Mold Control Maintain humidity below 50%. Clean washable surfaces with 5% bleach solution. Remove sources e.g. Contaminated carpets.  Control of Dog or Cat Allergen Avoidance is the best way to manage a dog or cat allergy. If you have a dog or cat and are  allergic to dog or cats, consider removing the dog or cat from the home. If you have a dog or cat but don't want to find it a new home, or if your family wants a pet even though someone in the household is allergic, here are some strategies that may help keep symptoms at bay:  Keep the pet out of  your bedroom and restrict it to only a few rooms. Be advised that keeping the dog or cat in only one room will not limit the allergens to that room. Don't pet, hug or kiss the dog or cat; if you do, wash your hands with soap and water. High-efficiency particulate air (HEPA) cleaners run continuously in a bedroom or living room can reduce allergen levels over time. Regular use of a high-efficiency vacuum cleaner or a central vacuum can reduce allergen levels. Giving your dog or cat a bath at least once a week can reduce airborne allergen.  Control of Cockroach Allergen Cockroach allergen has been identified as an important cause of acute attacks of asthma, especially in urban settings.  There are fifty-five species of cockroach that exist in the [place], however only three, the [place], [place] species produce allergen that can affect patients with Asthma.  Allergens can be obtained from fecal particles, egg casings and secretions from cockroaches.    Remove food sources. Reduce access to water. Seal access and entry points. Spray runways with 0.5-1% Diazinon or Chlorpyrifos Blow boric acid power under stoves and refrigerator. Place bait stations (hydramethylnon) at feeding sites.

## 2023-07-13 ENCOUNTER — Encounter: Payer: Self-pay | Admitting: Family Medicine

## 2023-07-13 ENCOUNTER — Other Ambulatory Visit: Payer: Self-pay

## 2023-07-13 ENCOUNTER — Ambulatory Visit: Payer: BC Managed Care – PPO | Admitting: Family Medicine

## 2023-07-13 VITALS — BP 154/106 | HR 77 | Temp 98.3°F | Resp 18 | Ht 62.5 in | Wt 216.2 lb

## 2023-07-13 DIAGNOSIS — J3089 Other allergic rhinitis: Secondary | ICD-10-CM

## 2023-07-13 DIAGNOSIS — J302 Other seasonal allergic rhinitis: Secondary | ICD-10-CM

## 2023-07-13 DIAGNOSIS — J4550 Severe persistent asthma, uncomplicated: Secondary | ICD-10-CM | POA: Diagnosis not present

## 2023-07-13 DIAGNOSIS — B999 Unspecified infectious disease: Secondary | ICD-10-CM

## 2023-07-13 MED ORDER — LORATADINE 10 MG PO TABS: 10.0000 mg | ORAL_TABLET | Freq: Every day | ORAL | 1 refills | Status: DC

## 2023-07-13 MED ORDER — BREZTRI AEROSPHERE 160-9-4.8 MCG/ACT IN AERO: INHALATION_SPRAY | RESPIRATORY_TRACT | 2 refills | Status: DC

## 2023-07-13 MED ORDER — FLUTICASONE PROPIONATE 50 MCG/ACT NA SUSP: NASAL | 1 refills | Status: DC

## 2023-07-13 MED ORDER — ALBUTEROL SULFATE HFA 108 (90 BASE) MCG/ACT IN AERS: 2.0000 | INHALATION_SPRAY | Freq: Four times a day (QID) | RESPIRATORY_TRACT | 2 refills | Status: DC | PRN

## 2023-07-15 ENCOUNTER — Encounter: Payer: Self-pay | Admitting: Family Medicine

## 2023-07-28 ENCOUNTER — Other Ambulatory Visit: Payer: Self-pay | Admitting: Family Medicine

## 2023-07-28 DIAGNOSIS — I10 Essential (primary) hypertension: Secondary | ICD-10-CM

## 2023-08-26 ENCOUNTER — Other Ambulatory Visit: Payer: Self-pay | Admitting: Family Medicine

## 2023-08-26 DIAGNOSIS — J4521 Mild intermittent asthma with (acute) exacerbation: Secondary | ICD-10-CM

## 2023-11-29 ENCOUNTER — Other Ambulatory Visit: Payer: Self-pay | Admitting: Family Medicine

## 2023-11-29 DIAGNOSIS — E1169 Type 2 diabetes mellitus with other specified complication: Secondary | ICD-10-CM

## 2024-01-11 ENCOUNTER — Ambulatory Visit: Payer: BC Managed Care – PPO | Admitting: Family Medicine

## 2024-01-12 ENCOUNTER — Other Ambulatory Visit: Payer: Self-pay | Admitting: Family Medicine

## 2024-01-25 ENCOUNTER — Other Ambulatory Visit: Payer: Self-pay | Admitting: Family Medicine

## 2024-01-25 DIAGNOSIS — F419 Anxiety disorder, unspecified: Secondary | ICD-10-CM

## 2024-02-01 ENCOUNTER — Other Ambulatory Visit: Payer: Self-pay | Admitting: Family Medicine

## 2024-02-01 DIAGNOSIS — I10 Essential (primary) hypertension: Secondary | ICD-10-CM

## 2024-02-01 DIAGNOSIS — J4521 Mild intermittent asthma with (acute) exacerbation: Secondary | ICD-10-CM

## 2024-03-12 ENCOUNTER — Ambulatory Visit: Admitting: Family Medicine

## 2024-04-15 NOTE — Progress Notes (Deleted)
   [address] [place] [address] Dept: [phone]  FOLLOW UP NOTE  Patient ID: Terri Richardson Qu[place], female    DOB: 05-20-1971  Age: 53 y.o. MRN: [id number] Date of Office Visit: 04/16/2024  Assessment  Chief Complaint: No chief complaint on file.  HPI Gwen Sarvis is a 53 year old female who presents to the clinic for follow-up visit.  She was last seen in this clinic on 07/13/2023 by Arlean Mutter,, for evaluation of asthma, allergic rhinitis, and recurrent infection.  Her last environmental allergy  skin testing was on 06/29/2021 and was positive to feather, tree pollen, outdoor mold, cat, and cockroach.   Chart review indicates blood work from 02/01/2021 with nonprotective diphtheria titers and 5 out of 23 protective pneumococcal titers. Discussed the use of AI scribe software for clinical note transcription with the patient, who gave verbal consent to proceed.  History of Present Illness      Drug Allergies:  Allergies  Allergen Reactions   Other Other (See Comments)    Catgut stitches causes water blisters    Physical Exam: There were no vitals taken for this visit.   Physical Exam  Diagnostics:    Assessment and Plan: No diagnosis found.  No orders of the defined types were placed in this encounter.   There are no Patient Instructions on file for this visit.  No follow-ups on file.    Thank you for the opportunity to care for this patient.  Please do not hesitate to contact me with questions.  Arlean Mutter, FNP [facility]

## 2024-04-15 NOTE — Patient Instructions (Incomplete)
Asthma Continue montelukast 10 mg once a day to prevent cough or wheeze Continue albuterol 2 puffs once every 4 hours as needed for cough or wheeze You may use albuterol 2 puffs 5 to 15 minutes before activity to decrease cough or wheeze Continue home administration of Fasenra once every 8 weeks for asthma control For asthma flare, begin Breztri 2 puffs twice a day with a spacer for 1-2 weeks or until cough and wheeze free  Allergic rhinitis Continue allergen avoidance measures directed toward feather, tree pollen, outdoor mold, cat, and cockroach as listed below  Continue Claritin 10 mg once a day as needed for runny nose or itch. Remember to rotate to a different antihistamine about every 3 months. Some examples of over the counter antihistamines include Zyrtec (cetirizine), Xyzal (levocetirizine), Allegra (fexofenadine), and Claritin (loratidine).  Continue Flonase 2 sprays in each nostril once a day as needed for stuffy nose Consider saline nasal rinses as needed for nasal symptoms. Use this before any medicated nasal sprays for best result Consider allergen immunotherapy if your symptoms are not well-controlled with the treatment plan as listed above  Recurrent infection Keep track of infections, antibiotic use, and steroid use. Consider getting a PPSV23 or Prevnar 20 and Tdap  Your blood pressure was elevated at today's visit. Follow up with your primary care provider for evaluation of your blood pressure.  Call the clinic if this treatment plan is not working well for you.  Follow up in 6 months or sooner if needed.  Reducing Pollen Exposure The American Academy of Allergy, Asthma and Immunology suggests the following steps to reduce your exposure to pollen during allergy seasons. Do not hang sheets or clothing out to dry; pollen may collect on these items. Do not mow lawns or spend time around freshly cut grass; mowing stirs up pollen. Keep windows closed at night.  Keep car  windows closed while driving. Minimize morning activities outdoors, a time when pollen counts are usually at their highest. Stay indoors as much as possible when pollen counts or humidity is high and on windy days when pollen tends to remain in the air longer. Use air conditioning when possible.  Many air conditioners have filters that trap the pollen spores. Use a HEPA room air filter to remove pollen form the indoor air you breathe.  Control of Mold Allergen Mold and fungi can grow on a variety of surfaces provided certain temperature and moisture conditions exist.  Outdoor molds grow on plants, decaying vegetation and soil.  The major outdoor mold, Alternaria and Cladosporium, are found in very high numbers during hot and dry conditions.  Generally, a late Summer - Fall peak is seen for common outdoor fungal spores.  Rain will temporarily lower outdoor mold spore count, but counts rise rapidly when the rainy period ends.  The most important indoor molds are Aspergillus and Penicillium.  Dark, humid and poorly ventilated basements are ideal sites for mold growth.  The next most common sites of mold growth are the bathroom and the kitchen.  Outdoor [employer] Use air conditioning and keep windows closed Avoid exposure to decaying vegetation. Avoid leaf raking. Avoid grain handling. Consider wearing a face mask if working in moldy areas.  Indoor Mold Control Maintain humidity below 50%. Clean washable surfaces with 5% bleach solution. Remove sources e.g. Contaminated carpets.  Control of Dog or Cat Allergen Avoidance is the best way to manage a dog or cat allergy. If you have a dog or cat and are  allergic to dog or cats, consider removing the dog or cat from the home. If you have a dog or cat but don't want to find it a new home, or if your family wants a pet even though someone in the household is allergic, here are some strategies that may help keep symptoms at bay:  Keep the pet out of  your bedroom and restrict it to only a few rooms. Be advised that keeping the dog or cat in only one room will not limit the allergens to that room. Don't pet, hug or kiss the dog or cat; if you do, wash your hands with soap and water. High-efficiency particulate air (HEPA) cleaners run continuously in a bedroom or living room can reduce allergen levels over time. Regular use of a high-efficiency vacuum cleaner or a central vacuum can reduce allergen levels. Giving your dog or cat a bath at least once a week can reduce airborne allergen.  Control of Cockroach Allergen Cockroach allergen has been identified as an important cause of acute attacks of asthma, especially in urban settings.  There are fifty-five species of cockroach that exist in the [place], however only three, the [place], [place] species produce allergen that can affect patients with Asthma.  Allergens can be obtained from fecal particles, egg casings and secretions from cockroaches.    Remove food sources. Reduce access to water. Seal access and entry points. Spray runways with 0.5-1% Diazinon or Chlorpyrifos Blow boric acid power under stoves and refrigerator. Place bait stations (hydramethylnon) at feeding sites.

## 2024-04-16 ENCOUNTER — Ambulatory Visit: Admitting: Family Medicine

## 2024-05-22 ENCOUNTER — Other Ambulatory Visit: Payer: Self-pay | Admitting: Allergy & Immunology

## 2024-06-26 ENCOUNTER — Telehealth: Payer: Self-pay | Admitting: Allergy & Immunology

## 2024-06-26 NOTE — Telephone Encounter (Signed)
 Left voicemail to give the office a call back to schedule [facility] reapproval appointment.

## 2024-07-23 ENCOUNTER — Other Ambulatory Visit: Payer: Self-pay | Admitting: Family Medicine

## 2024-07-23 DIAGNOSIS — F419 Anxiety disorder, unspecified: Secondary | ICD-10-CM

## 2024-07-25 ENCOUNTER — Other Ambulatory Visit: Payer: Self-pay

## 2024-07-25 ENCOUNTER — Ambulatory Visit (INDEPENDENT_AMBULATORY_CARE_PROVIDER_SITE_OTHER): Admitting: Family Medicine

## 2024-07-25 ENCOUNTER — Ambulatory Visit: Payer: Self-pay | Admitting: Family Medicine

## 2024-07-25 ENCOUNTER — Encounter: Payer: Self-pay | Admitting: Family Medicine

## 2024-07-25 ENCOUNTER — Encounter: Payer: Self-pay | Admitting: Internal Medicine

## 2024-07-25 ENCOUNTER — Ambulatory Visit (INDEPENDENT_AMBULATORY_CARE_PROVIDER_SITE_OTHER): Admitting: Internal Medicine

## 2024-07-25 VITALS — BP 130/80 | HR 80 | Temp 97.7°F | Resp 18 | Ht 61.81 in | Wt 216.8 lb

## 2024-07-25 VITALS — BP 138/80 | HR 80 | Temp 97.9°F | Resp 16 | Ht 62.0 in | Wt 219.4 lb

## 2024-07-25 DIAGNOSIS — B999 Unspecified infectious disease: Secondary | ICD-10-CM

## 2024-07-25 DIAGNOSIS — E538 Deficiency of other specified B group vitamins: Secondary | ICD-10-CM | POA: Diagnosis not present

## 2024-07-25 DIAGNOSIS — G4733 Obstructive sleep apnea (adult) (pediatric): Secondary | ICD-10-CM

## 2024-07-25 DIAGNOSIS — R5382 Chronic fatigue, unspecified: Secondary | ICD-10-CM

## 2024-07-25 DIAGNOSIS — F330 Major depressive disorder, recurrent, mild: Secondary | ICD-10-CM

## 2024-07-25 DIAGNOSIS — I10 Essential (primary) hypertension: Secondary | ICD-10-CM

## 2024-07-25 DIAGNOSIS — J3089 Other allergic rhinitis: Secondary | ICD-10-CM

## 2024-07-25 DIAGNOSIS — E559 Vitamin D deficiency, unspecified: Secondary | ICD-10-CM

## 2024-07-25 DIAGNOSIS — E1169 Type 2 diabetes mellitus with other specified complication: Secondary | ICD-10-CM

## 2024-07-25 DIAGNOSIS — F419 Anxiety disorder, unspecified: Secondary | ICD-10-CM

## 2024-07-25 DIAGNOSIS — J302 Other seasonal allergic rhinitis: Secondary | ICD-10-CM | POA: Diagnosis not present

## 2024-07-25 DIAGNOSIS — J4550 Severe persistent asthma, uncomplicated: Secondary | ICD-10-CM | POA: Diagnosis not present

## 2024-07-25 DIAGNOSIS — E785 Hyperlipidemia, unspecified: Secondary | ICD-10-CM

## 2024-07-25 DIAGNOSIS — D239 Other benign neoplasm of skin, unspecified: Secondary | ICD-10-CM

## 2024-07-25 LAB — TSH: TSH: 2.96 u[IU]/mL (ref 0.35–5.50)

## 2024-07-25 LAB — LIPID PANEL
Cholesterol: 221 mg/dL — ABNORMAL HIGH (ref 0–200)
HDL: 60.6 mg/dL (ref >39.00)
LDL Cholesterol: 128 mg/dL — ABNORMAL HIGH (ref 0–99)
NonHDL: 160.33
Total CHOL/HDL Ratio: 4
Triglycerides: 162 mg/dL — ABNORMAL HIGH (ref 0.0–149.0)
VLDL: 32.4 mg/dL (ref 0.0–40.0)

## 2024-07-25 LAB — COMPREHENSIVE METABOLIC PANEL WITH GFR
ALT: 28 U/L (ref 0–35)
AST: 20 U/L (ref 0–37)
Albumin: 4.6 g/dL (ref 3.5–5.2)
Alkaline Phosphatase: 52 U/L (ref 39–117)
BUN: 18 mg/dL (ref 6–23)
CO2: 26 meq/L (ref 19–32)
Calcium: 9.7 mg/dL (ref 8.4–10.5)
Chloride: 103 meq/L (ref 96–112)
Creatinine, Ser: 0.85 mg/dL (ref 0.40–1.20)
GFR: 79.25 mL/min (ref >60.00)
Glucose, Bld: 118 mg/dL — ABNORMAL HIGH (ref 70–99)
Potassium: 4.1 meq/L (ref 3.5–5.1)
Sodium: 138 meq/L (ref 135–145)
Total Bilirubin: 0.4 mg/dL (ref 0.2–1.2)
Total Protein: 7.7 g/dL (ref 6.0–8.3)

## 2024-07-25 LAB — VITAMIN B12: Vitamin B-12: 241 pg/mL (ref 211–911)

## 2024-07-25 LAB — MICROALBUMIN / CREATININE URINE RATIO
Creatinine,U: 106.2 mg/dL
Microalb Creat Ratio: 25.4 mg/g (ref 0.0–30.0)
Microalb, Ur: 2.7 mg/dL — ABNORMAL HIGH (ref 0.0–1.9)

## 2024-07-25 LAB — HEMOGLOBIN A1C: Hgb A1c MFr Bld: 7 % — ABNORMAL HIGH (ref 4.6–6.5)

## 2024-07-25 LAB — VITAMIN D 25 HYDROXY (VIT D DEFICIENCY, FRACTURES): VITD: 17.37 ng/mL — ABNORMAL LOW (ref 30.00–100.00)

## 2024-07-25 MED ORDER — VITAMIN D (ERGOCALCIFEROL) 1.25 MG (50000 UNIT) PO CAPS: 50000.0000 [IU] | ORAL_CAPSULE | ORAL | 0 refills | Status: AC

## 2024-07-25 MED ORDER — MONTELUKAST SODIUM 10 MG PO TABS: 10.0000 mg | ORAL_TABLET | Freq: Every day | ORAL | 1 refills | Status: AC

## 2024-07-25 MED ORDER — ALBUTEROL SULFATE HFA 108 (90 BASE) MCG/ACT IN AERS: 2.0000 | INHALATION_SPRAY | Freq: Four times a day (QID) | RESPIRATORY_TRACT | 2 refills | Status: AC | PRN

## 2024-07-25 MED ORDER — BREZTRI AEROSPHERE 160-9-4.8 MCG/ACT IN AERO: 2.0000 | INHALATION_SPRAY | Freq: Every day | RESPIRATORY_TRACT | 5 refills | Status: AC

## 2024-07-25 MED ORDER — ROSUVASTATIN CALCIUM 10 MG PO TABS: 10.0000 mg | ORAL_TABLET | Freq: Every day | ORAL | 3 refills | Status: AC

## 2024-07-25 MED ORDER — LORATADINE 10 MG PO TABS: 10.0000 mg | ORAL_TABLET | Freq: Every day | ORAL | 1 refills | Status: AC

## 2024-07-25 MED ORDER — FLUTICASONE PROPIONATE 50 MCG/ACT NA SUSP: 2.0000 | Freq: Every day | NASAL | 5 refills | Status: AC

## 2024-07-25 MED ORDER — ARIPIPRAZOLE 2 MG PO TABS: 2.0000 mg | ORAL_TABLET | Freq: Every day | ORAL | 1 refills | Status: DC

## 2024-07-25 NOTE — Patient Instructions (Addendum)
 Severe Persistent Asthma  - Maintenance inhaler: use Breztri  160-9-4.62mcg 2 puffs daily, use Fasenra  30mg  every 8 weeks, Singualir 10mg  daily.   - Rescue inhaler: Albuterol  2 puffs via spacer or 1 vial via nebulizer every 4-6 hours as needed for respiratory symptoms of cough, shortness of breath, or wheezing Asthma control goals:  Full participation in all desired activities (may need albuterol  before activity) Albuterol  use two times or less a week on average (not counting use with activity) Cough interfering with sleep two times or less a month Oral steroids no more than once a year No hospitalizations  Allergic rhinitis - Continue allergen avoidance measures directed toward feather, tree pollen, outdoor mold, cat, and cockroach as listed below  - Continue Claritin  10 mg once a day as needed for runny nose or itch. Remember to rotate to a different antihistamine about every 3 months. Some examples of over the counter antihistamines include Zyrtec  (cetirizine ), Xyzal (levocetirizine), Allegra  (fexofenadine ), and Claritin  (loratidine).  - Continue Flonase  2 sprays in each nostril daily. Aim upward and outward.  - Consider saline nasal rinses as needed for nasal symptoms. Use this before any medicated nasal sprays for best result - Use Singulair  10mg  daily.  Stop if you notice any mood/behavior changes. - Consider allergen immunotherapy if your symptoms are not well-controlled with the treatment plan as listed above  Recurrent infection - Keep track of infections, antibiotic use, and steroid use.

## 2024-07-25 NOTE — Assessment & Plan Note (Signed)
 She does not think Fluoxetine  40 mg is helping. Psychiatrist referral placed.

## 2024-07-25 NOTE — Assessment & Plan Note (Signed)
She is not on vit D supplementation. Further recommendations according to 25 [place] vit D result. 

## 2024-07-25 NOTE — Assessment & Plan Note (Signed)
 Problem has been well-controlled with non pharmacologic treatment. Regular exercise and healthy diet with avoidance of added sugar food intake is an important part of treatment and recommended. Annual eye exam, periodic dental and foot care recommended. Further recommendation will be given according to hemoglobin A1c result. F/U in 5-6 months

## 2024-07-25 NOTE — Assessment & Plan Note (Signed)
 She has not been taking Pravastatin  for a few months, has not been able to fill Rx. Last LDL 80 in 01/2022.

## 2024-07-25 NOTE — Progress Notes (Signed)
 FOLLOW UP Date of Service/Encounter:  07/25/24   Subjective:  Terri Richardson (DOB: November 25, 1970) is a 53 y.o. female who [facility] for follow up for asthma, allergic rhinitis, and recurrent infection   History obtained from: chart review and patient. Last seen on 07/15/2023 with Arlean Mutter and was doing well on Fasenra , Breztri , Singulair .  Reports doing very well since last visit.  Asthma is well controlled, no flare ups requiring ER visits/oral prednisone .  Denies much trouble with dyspnea/wheezing/cough.  Using Fasenra  every 8 weeks, Breztri  and Singulair .  Rarely needs rescue inhaler.  Does note some congestion, otherwise doing okay.  Taking anti histamine (currently Claritin ) and Singulair . Not using nose spray regularly.   No infections/abx use since last visit.   Past Medical History: Past Medical History:  Diagnosis Date   Allergy     Asthma    Depression    Diabetes mellitus 2006   GERD (gastroesophageal reflux disease)    Hypertension     Objective:  BP 130/80 (BP Location: Left Arm, Patient Position: Sitting, Cuff Size: Normal)   Pulse 80   Temp 97.7 F (36.5 C) (Temporal)   Resp 18   Ht 5' 1.81 (1.57 m)   Wt 216 lb 12.8 oz (98.3 kg)   SpO2 97%   BMI 39.90 kg/m  Body mass index is 39.9 kg/m. Physical Exam: GEN: alert, well developed HEENT: clear conjunctiva, nose with mild inferior turbinate hypertrophy, pink nasal mucosa, + clear rhinorrhea, + cobblestoning HEART: regular rate and rhythm, no murmur LUNGS: clear to auscultation bilaterally, no coughing, unlabored respiration SKIN: no rashes or lesions  Spirometry:  Tracings reviewed. Her effort: Good reproducible efforts. FVC: 2.89L, 90% predicted  FEV1: 2.25L, 88% predicted FEV1/FVC ratio: 78% Interpretation: Spirometry consistent with normal pattern.  Please see scanned spirometry results for details.  Assessment:   1. Seasonal and perennial allergic  rhinitis   2. Recurrent infections   3. Severe persistent asthma without complication (HCC)     Plan/Recommendations:   Severe Persistent Asthma  - Well controlled.  Spirometry today was normal. MDI technique discussed.  Continue Fasenra  and Breztri  + Singulair .  - Maintenance inhaler: use Breztri  160-9-4.90mcg 2 puffs daily, use Fasenra  30mg  every 8 weeks, Singualir 10mg  daily.   - Rescue inhaler: Albuterol  2 puffs via spacer or 1 vial via nebulizer every 4-6 hours as needed for respiratory symptoms of cough, shortness of breath, or wheezing Asthma control goals:  Full participation in all desired activities (may need albuterol  before activity) Albuterol  use two times or less a week on average (not counting use with activity) Cough interfering with sleep two times or less a month Oral steroids no more than once a year No hospitalizations  Allergic rhinitis - Not well controlled, add Flonase .  - Continue allergen avoidance measures directed toward feather, tree pollen, outdoor mold, cat, and cockroach as listed below  - Continue Claritin  10 mg once a day as needed for runny nose or itch. Remember to rotate to a different antihistamine about every 3 months. Some examples of over the counter antihistamines include Zyrtec  (cetirizine ), Xyzal (levocetirizine), Allegra  (fexofenadine ), and Claritin  (loratidine).  - Continue Flonase  2 sprays in each nostril daily. Aim upward and outward.  - Consider saline nasal rinses as needed for nasal symptoms. Use this before any medicated nasal sprays for best result - Use Singulair  10mg  daily.  Stop if you notice any mood/behavior changes. - Consider allergen immunotherapy if your symptoms are  not well-controlled with the treatment plan as listed above  Recurrent infection - Controlled, if recurs, will need Pneumovax with repeat titers in future.  - Keep track of infections, antibiotic use, and steroid use.      Return in about 6 months (around  01/23/2025).  Arleta Blanch, MD [facility] 

## 2024-07-25 NOTE — Assessment & Plan Note (Signed)
 Problem is not well controlled. She has tried Sertraline  and Effexor  in the past. Currently on Fluoxetine  40 mg daily, Psychiatrist referral placed. Options discussed, changing to a different med or adding another agent, she agrees with the latter one. Abilify 2 mg daily added. F/U in 2 months, before if needed.

## 2024-07-25 NOTE — Assessment & Plan Note (Signed)
 BP mildly elevated today. She is not monitoring BP at home, recommend doing so. For no continue Losartan  50 mg daily and low salt diet.

## 2024-07-25 NOTE — Patient Instructions (Addendum)
 A few things to remember from today's visit:  Type 2 diabetes mellitus with other specified complication, without long-term current use of insulin (HCC) - Plan: Comprehensive metabolic panel with GFR, Microalbumin / creatinine urine ratio, Hemoglobin A1c  Anxiety disorder, unspecified type - Plan: Ambulatory referral to Psychiatry  Essential hypertension  Hyperlipidemia associated with type 2 diabetes mellitus (HCC) - Plan: Comprehensive metabolic panel with GFR, Lipid panel  OSA (obstructive sleep apnea) - Plan: Ambulatory referral to Pulmonology  Vitamin D deficiency, unspecified - Plan: VITAMIN D 25 Hydroxy (Vit-D Deficiency, Fractures)  Encounter for HCV screening test for low risk patient  Benign neoplasm of skin, unspecified location - Plan: Ambulatory referral to Dermatology  Chronic fatigue - Plan: TSH  Patient requested diagnostic testing - Plan: Vitamin B12  Depression, major, recurrent, moderate (HCC) - Plan: Ambulatory referral to Psychiatry  Abilify 2 mg added today. No changes in Fluoxetine . Psychiatrist referral placed.  If you need refills for medications you take chronically, please call your pharmacy. Do not use My Chart to request refills or for acute issues that need immediate attention. If you send a my chart message, it may take a few days to be addressed, specially if I am not in the office.  Please be sure medication list is accurate. If a new problem present, please set up appointment sooner than planned today.

## 2024-07-25 NOTE — Progress Notes (Signed)
 Chief Complaint  Patient presents with   Medical Management of Chronic Issues    Follow-up    Discussed the use of AI scribe software for clinical note transcription with the patient, who gave verbal consent to proceed.  History of Present Illness Terri Richardson is a 52 year old female with PMHx of asthma, hypertension, DM II, HLD, and anxiety who presents for follow-up and medication management. Her husband is here today. She was last seen on 03/06/23.  Since her last visit she has followed with immunologist for asthma and allergies, every 6 months. She has an appointment today.  Anxiety and depression: Her husband is concerned about mood changes, irritability.  She is currently taking fluoxetine  40 mg daily but finds it ineffective. Her family notes her anxiety is not well controlled, especially during long car trips when someone else is driving. She experiences mood swings, including episodes of anger and apathy. She has previously tried sertraline  and venlafaxine  without success and continues to use tools provided by a psychotherapist.     07/25/2024    7:58 AM 03/06/2023    3:23 PM 07/03/2022    3:36 PM 03/10/2022    8:03 AM 09/28/2016    1:05 PM  Depression screen PHQ 2/9  Decreased Interest 3 0 0 3 1  Down, Depressed, Hopeless 3 0 0 3 1  PHQ - 2 Score 6 0 0 6 2  Altered sleeping 3  3 3 1   Tired, decreased energy 0  3 3 1   Change in appetite 0  3 0 0  Feeling bad or failure about yourself  0  0 0 0  Trouble concentrating 0  0 0 1  Moving slowly or fidgety/restless 0  0 0 0  Suicidal thoughts 0  0 0 0   PHQ-9 Score 9  9 12 5   Difficult doing work/chores Somewhat difficult  Somewhat difficult Extremely dIfficult Not difficult at all     Data saved with a previous flowsheet row definition      07/25/2024    8:01 AM  GAD 7 : Generalized Anxiety Score  Nervous, Anxious, on Edge 2  Control/stop worrying 0  Worry too much - different things 0  Trouble relaxing 0   Restless 0  Easily annoyed or irritable 2  Afraid - awful might happen 0  Total GAD 7 Score 4  Anxiety Difficulty Somewhat difficult   C/O fatigue. She experiences sleep disturbances, waking up multiple times during the night and not feeling rested in the morning. She has been told that she snores and may stop breathing at night. She wakes up early for work and often naps during the day when not working.  HLD: She is not currently taking pravastatin  due to an issue with the pharmacy.  She does not consume alcohol, rarely eats out, and prefers to cook at home.  She tries to engage in physical activity but acknowledges it may not be sufficient.   Her husband would like vit D and B2 check. Concerned about wt gain. She is not exercising regularly.  She has a history of vitamin D deficiency, not on vit D supplementation.  Hypertension:  is losartan  50 mg daily. She has not been consistent with monitoring her blood pressure at home. Negative for unusual or severe headache, visual changes, exertional chest pain, dyspnea,  focal weakness, or edema.  Lab Results  Component Value Date   CREATININE 0.87 03/06/2023   BUN 15 03/06/2023   NA 139 03/06/2023  K 3.8 03/06/2023   CL 104 03/06/2023   CO2 26 03/06/2023   Diabetes Mellitus II:  Dx'ed in 2006  She has not been checking her blood sugars regularly. She is on non pharmacologic treatment. Negative for symptoms of hypoglycemia, polyuria, polydipsia, numbness extremities, foot ulcers/trauma  Lab Results  Component Value Date   HGBA1C 6.7 (H) 03/06/2023   Lab Results  Component Value Date   CHOL 151 02/14/2022   HDL 50 02/14/2022   LDLCALC 80 02/14/2022   TRIG 103 02/14/2022   CHOLHDL 3.0 02/14/2022   Lesion on abdomen for years, occasionally tender and purulent drainage.  Review of Systems  Constitutional:  Positive for fatigue. Negative for activity change, appetite change, chills and fever.  HENT:  Negative for sore  throat.   Respiratory:  Negative for cough and wheezing.   Gastrointestinal:  Negative for abdominal pain, nausea and vomiting.  Endocrine: Negative for cold intolerance and heat intolerance.  Genitourinary:  Negative for decreased urine volume, dysuria and hematuria.  Skin:  Negative for rash.  Allergic/Immunologic: Positive for environmental allergies.  Neurological:  Negative for syncope and facial asymmetry.  Psychiatric/Behavioral:  Positive for sleep disturbance. Negative for confusion and hallucinations. The patient is nervous/anxious.   See other pertinent positives and negatives in HPI.  Current Outpatient Medications on File Prior to Visit  Medication Sig Dispense Refill   acetaminophen  (TYLENOL ) 325 MG tablet Take 2 tablets (650 mg total) by mouth every 4 (four) hours as needed for mild pain (or temp > 37.5 C (99.5 F)).     albuterol  (VENTOLIN  HFA) 108 (90 Base) MCG/ACT inhaler Inhale 2 puffs into the lungs every 6 (six) hours as needed for wheezing or shortness of breath. 18 g 2   Budeson-Glycopyrrol-Formoterol  (BREZTRI  AEROSPHERE) 160-9-4.8 MCG/ACT AERO For asthma flare, begin Breztri  2 puffs twice a day with a spacer for 1-2 weeks or until cough and wheeze free 10.7 g 2   FASENRA  PEN 30 MG/ML prefilled autoinjector INJECT 1 PEN UNDER THE SKIN EVERY 8 WEEKS 1 mL 6   FLUoxetine  (PROZAC ) 40 MG capsule TAKE 1 CAPSULE (40 MG TOTAL) BY MOUTH DAILY. 30 capsule 0   fluticasone  (FLONASE ) 50 MCG/ACT nasal spray PLACE 2 SPRAYS IN EACH NOSTRIL ONCE A DAY AS NEEDED FOR STUFFY NOSE 48 mL 3   loratadine  (CLARITIN ) 10 MG tablet TAKE 1 TABLET BY MOUTH EVERY DAY 90 tablet 1   losartan  (COZAAR ) 50 MG tablet TAKE 1 TABLET BY MOUTH EVERY DAY 90 tablet 1   montelukast  (SINGULAIR ) 10 MG tablet TAKE 1 TABLET BY MOUTH EVERYDAY AT BEDTIME 90 tablet 1   omeprazole  (PRILOSEC  OTC) 20 MG tablet Take 20 mg by mouth daily.     pravastatin  (PRAVACHOL ) 20 MG tablet TAKE 1 TABLET BY MOUTH EVERY DAY 90 tablet 1    rOPINIRole  (REQUIP ) 0.5 MG tablet TAKE 0.5-1 TABLETS (0.25-0.5 MG TOTAL) BY MOUTH AT BEDTIME. (Patient not taking: Reported on 07/25/2024) 90 tablet 1   Current Facility-Administered Medications on File Prior to Visit  Medication Dose Route Frequency Provider Last Rate Last Admin   Benralizumab  SOSY 30 mg  30 mg Subcutaneous Q28 days Iva Marty Saltness, MD   30 mg at 03/03/21 1532    Past Medical History:  Diagnosis Date   Allergy     Asthma    Depression    Diabetes mellitus 2006   GERD (gastroesophageal reflux disease)    Hypertension     Allergies  Allergen Reactions   Other Other (See  Comments)    Catgut stitches causes water blisters    Social History   Socioeconomic History   Marital status: Married    Spouse name: Not on file   Number of children: Not on file   Years of education: Not on file   Highest education level: Not on file  Occupational History   Occupation: Works at [employer]  Tobacco Use   Smoking status: Former    Current packs/day: 0.00    Average packs/day: 0.5 packs/day for 4.0 years (2.0 ttl pk-yrs)    Types: Cigarettes    Start date: 02/20/2010    Quit date: 02/20/2014    Years since quitting: 10.4   Smokeless tobacco: Never  Vaping Use   Vaping status: Never Used  Substance and Sexual Activity   Alcohol use: No    Alcohol/week: 0.0 standard drinks of alcohol   Drug use: No   Sexual activity: Yes    Birth control/protection: None  Other Topics Concern   Not on file  Social History Narrative   Not on file   Social Drivers of Health   Financial Resource Strain: Not on file  Food Insecurity: Not on file  Transportation Needs: Not on file  Physical Activity: Not on file  Stress: Not on file  Social Connections: Not on file    Today's Vitals   07/25/24 0756  BP: 138/80  Pulse: 80  Resp: 16  Temp: 97.9 F (36.6 C)  SpO2: 99%  Weight: 219 lb 6.4 oz (99.5 kg)  Height: 5' 2 (1.575 m)   Wt Readings from Last 3 Encounters:   07/25/24 219 lb 6.4 oz (99.5 kg)  07/13/23 216 lb 4 oz (98.1 kg)  03/06/23 216 lb 2 oz (98 kg)   Body mass index is 40.13 kg/m.  Physical Exam Vitals and nursing note reviewed.  Constitutional:      General: She is not in acute distress.    Appearance: She is well-developed.  HENT:     Head: Normocephalic and atraumatic.     Mouth/Throat:     Mouth: Mucous membranes are moist.     Pharynx: Oropharynx is clear.  Eyes:     Conjunctiva/sclera: Conjunctivae normal.  Cardiovascular:     Rate and Rhythm: Normal rate and regular rhythm.     Pulses:          Dorsalis pedis pulses are 2+ on the right side and 2+ on the left side.     Heart sounds: No murmur heard. Pulmonary:     Effort: Pulmonary effort is normal. No respiratory distress.     Breath sounds: Normal breath sounds.  Abdominal:     Palpations: Abdomen is soft. There is no hepatomegaly or mass.     Tenderness: There is no abdominal tenderness.  Lymphadenopathy:     Cervical: No cervical adenopathy.  Skin:    General: Skin is warm.     Findings: No erythema or rash.      Neurological:     General: No focal deficit present.     Mental Status: She is alert and oriented to person, place, and time.     Cranial Nerves: No cranial nerve deficit.     Gait: Gait normal.  Psychiatric:        Mood and Affect: Mood and affect normal.        Thought Content: Thought content does not include homicidal or suicidal ideation.    ASSESSMENT AND PLAN:  Terri Richardson was seen  today for medical management of chronic issues.  Diagnoses and all orders for this visit:  Orders Placed This Encounter  Procedures   Comprehensive metabolic panel with GFR   Microalbumin / creatinine urine ratio   Hemoglobin A1c   Lipid panel   VITAMIN D 25 Hydroxy (Vit-D Deficiency, Fractures)   Vitamin B12   TSH   Ambulatory referral to Pulmonology   Ambulatory referral to Dermatology   Ambulatory referral to Psychiatry   Lab Results   Component Value Date   VITAMINB12 241 07/25/2024   Lab Results  Component Value Date   VD25OH 17.37 (L) 07/25/2024   Lab Results  Component Value Date   MICROALBUR 2.7 (H) 07/25/2024   Lab Results  Component Value Date   HGBA1C 7.0 (H) 07/25/2024   Lab Results  Component Value Date   NA 138 07/25/2024   CL 103 07/25/2024   K 4.1 07/25/2024   CO2 26 07/25/2024   BUN 18 07/25/2024   CREATININE 0.85 07/25/2024   GFR 79.25 07/25/2024   CALCIUM 9.7 07/25/2024   ALBUMIN 4.6 07/25/2024   GLUCOSE 118 (H) 07/25/2024   Lab Results  Component Value Date   ALT 28 07/25/2024   AST 20 07/25/2024   ALKPHOS 52 07/25/2024   BILITOT 0.4 07/25/2024   Lab Results  Component Value Date   TSH 2.96 07/25/2024   Type 2 diabetes mellitus with other specified complication, without long-term current use of insulin (HCC) Assessment & Plan: Problem has been well-controlled with non pharmacologic treatment. Regular exercise and healthy diet with avoidance of added sugar food intake is an important part of treatment and recommended. Annual eye exam, periodic dental and foot care recommended. Further recommendation will be given according to hemoglobin A1c result. F/U in 5-6 months  Orders: -     Comprehensive metabolic panel with GFR; Future -     Microalbumin / creatinine urine ratio; Future -     Hemoglobin A1c; Future  Chronic fatigue Possible etiologies discussed. Some of her chronic comorbilities can be contributing factors.  -     TSH; Future  Hyperlipidemia associated with type 2 diabetes mellitus (HCC) Assessment & Plan: She has not been taking Pravastatin  for a few months, has not been able to fill Rx. Last LDL 80 in 01/2022.  Orders: -     Comprehensive metabolic panel with GFR; Future -     Lipid panel; Future  OSA (obstructive sleep apnea) Witnessed by husband. Pulmonologist referral placed.  -     Pulmonary Visit  Anxiety disorder, unspecified type Assessment  & Plan: She does not think Fluoxetine  40 mg is helping. Psychiatrist referral placed.  Orders: -     Ambulatory referral to Psychiatry  Essential hypertension Assessment & Plan: BP mildly elevated today. She is not monitoring BP at home, recommend doing so. For no continue Losartan  50 mg daily and low salt diet.  Vitamin D deficiency, unspecified Assessment & Plan: She is not on vit D supplementation. Further recommendations according to 25 OH vit D result.  Orders: -     VITAMIN D 25 Hydroxy (Vit-D Deficiency, Fractures); Future -     Vitamin D (Ergocalciferol); Take 1 capsule (50,000 Units total) by mouth every 7 (seven) days for 12 doses.  Dispense: 12 capsule; Refill: 0  Depression, major, recurrent, mild Assessment & Plan: Problem is not well controlled. She has tried Sertraline  and Effexor  in the past. Currently on Fluoxetine  40 mg daily, Psychiatrist referral placed. Options discussed, changing to  a different med or adding another agent, she agrees with the latter one. Abilify 2 mg daily added. F/U in 2 months, before if needed.  Orders: -     ARIPiprazole; Take 1 tablet (2 mg total) by mouth daily.  Dispense: 30 tablet; Refill: 1 -     Ambulatory referral to Psychiatry  Benign neoplasm of skin, unspecified location ? Sebaceous cyst on abdominal wall. Dermatology referral placed.  -     Ambulatory referral to Dermatology  B12 deficiency Will recommend vit B12 supplementation.  -     Vitamin B12; Future  I personally spent a total of 46 minutes in the care of the patient today including preparing to see the patient, getting/reviewing separately obtained history, performing a medically appropriate exam/evaluation, counseling and educating, placing orders, documenting clinical information in the EHR, independently interpreting results, and communicating results.   Return in about 2 months (around 09/24/2024) for chronic problems.  Brenin Heidelberger [place], MD [facility]. [place] office.

## 2024-07-30 ENCOUNTER — Telehealth: Payer: Self-pay

## 2024-07-30 MED ORDER — OZEMPIC (0.25 OR 0.5 MG/DOSE) 2 MG/3ML ~~LOC~~ SOPN: 0.2500 mg | PEN_INJECTOR | SUBCUTANEOUS | 3 refills | Status: DC

## 2024-07-30 MED ORDER — CYANOCOBALAMIN 1000 MCG/ML IJ SOLN: INTRAMUSCULAR | 0 refills | Status: DC

## 2024-07-30 NOTE — Telephone Encounter (Signed)
 Medications sent per Dr. Gib verbal orders.    Copied from CRM [id number]. Topic: General - Other >> Jul 30, 2024 12:56 PM [place] A wrote: Reason for CRM: Patient is following up about two medications that were to be called in at Pharmacy on Monday per nurse-please contact

## 2024-07-30 NOTE — Addendum Note (Signed)
 Addended by: DIONISIO CAMELIA PARAS on: 07/30/2024 01:04 PM   Modules accepted: Orders

## 2024-08-01 ENCOUNTER — Telehealth: Payer: Self-pay | Admitting: *Deleted

## 2024-08-01 DIAGNOSIS — E538 Deficiency of other specified B group vitamins: Secondary | ICD-10-CM

## 2024-08-01 MED ORDER — CYANOCOBALAMIN 1000 MCG/ML IJ SOLN: INTRAMUSCULAR | 0 refills | Status: AC

## 2024-08-01 NOTE — Telephone Encounter (Signed)
 Spoke with patient regarding medications. Education given on how to adminster Ozempic and B12 injections. Education provided on dosing. Refill for second B12 sent to pharmacy. Patient voiced understanding.

## 2024-08-01 NOTE — Telephone Encounter (Signed)
 Copied from CRM [id number]. Topic: Clinical - Medication Question >> Aug 01, 2024  9:24 AM [name] wrote: Reason for CRM: Pt is calling in regards Semaglutide,0.25 or 0.5MG /DOS, (OZEMPIC, 0.25 OR 0.5 MG/DOSE,) 2 MG/3ML SOPN & B12 & Cholesterol medication. Pt states she was told verbally how to use these medication pt wrote everything down and now can't find the paper she is asking for a call back to give her instructions. And also states she was suppose to have a cholesterol med sent over to pharmacy but they stated they did not receive that. Please reach out to pt in regards to all this

## 2024-08-11 NOTE — Telephone Encounter (Signed)
 Patient was prescribed a B12 shot. The Pharmacy is not filling it? Saying they are waiting for instructions from the PCP? Please call the Pharmacy where the B12 was sent and advise.

## 2024-08-11 NOTE — Telephone Encounter (Signed)
 Spoke with pharmacy and verified prescription of B12 injection x1.

## 2024-08-19 ENCOUNTER — Other Ambulatory Visit: Payer: Self-pay

## 2024-08-19 DIAGNOSIS — F330 Major depressive disorder, recurrent, mild: Secondary | ICD-10-CM

## 2024-08-19 MED ORDER — ARIPIPRAZOLE 2 MG PO TABS: 2.0000 mg | ORAL_TABLET | Freq: Every day | ORAL | 1 refills | Status: AC

## 2024-08-21 ENCOUNTER — Other Ambulatory Visit: Payer: Self-pay | Admitting: Family Medicine

## 2024-08-21 DIAGNOSIS — F419 Anxiety disorder, unspecified: Secondary | ICD-10-CM

## 2024-09-04 ENCOUNTER — Ambulatory Visit: Admitting: Primary Care

## 2024-09-17 ENCOUNTER — Other Ambulatory Visit: Payer: Self-pay | Admitting: Family Medicine

## 2024-09-17 DIAGNOSIS — F419 Anxiety disorder, unspecified: Secondary | ICD-10-CM

## 2024-09-23 ENCOUNTER — Ambulatory Visit: Admitting: Primary Care

## 2024-09-23 ENCOUNTER — Encounter: Payer: Self-pay | Admitting: Primary Care

## 2024-09-23 VITALS — BP 132/72 | HR 75 | Temp 97.6°F | Ht 62.0 in | Wt 206.2 lb

## 2024-09-23 DIAGNOSIS — E669 Obesity, unspecified: Secondary | ICD-10-CM | POA: Diagnosis not present

## 2024-09-23 DIAGNOSIS — R0683 Snoring: Secondary | ICD-10-CM | POA: Diagnosis not present

## 2024-09-23 DIAGNOSIS — Z87891 Personal history of nicotine dependence: Secondary | ICD-10-CM

## 2024-09-23 DIAGNOSIS — G4710 Hypersomnia, unspecified: Secondary | ICD-10-CM

## 2024-09-23 DIAGNOSIS — R0681 Apnea, not elsewhere classified: Secondary | ICD-10-CM

## 2024-09-23 DIAGNOSIS — L728 Other follicular cysts of the skin and subcutaneous tissue: Secondary | ICD-10-CM | POA: Diagnosis not present

## 2024-09-23 DIAGNOSIS — J45909 Unspecified asthma, uncomplicated: Secondary | ICD-10-CM

## 2024-09-23 DIAGNOSIS — Z6837 Body mass index (BMI) 37.0-37.9, adult: Secondary | ICD-10-CM

## 2024-09-23 DIAGNOSIS — D485 Neoplasm of uncertain behavior of skin: Secondary | ICD-10-CM | POA: Diagnosis not present

## 2024-09-23 DIAGNOSIS — D234 Other benign neoplasm of skin of scalp and neck: Secondary | ICD-10-CM | POA: Diagnosis not present

## 2024-09-23 NOTE — Progress Notes (Signed)
 @Patient  ID: Terri Richardson, female    DOB: 1971/10/14, 53 y.o.   MRN: [id number]  Chief Complaint  Patient presents with   Consult    Snoring. Fatigue. Unable to sleep during the night     Referring provider: Jordan, Betty G, MD  HPI: Discussed the use of AI scribe software for clinical note transcription with the patient, who gave verbal consent to proceed.  History of Present Illness Terri Richardson is a 53 year old female who presents with sleep disturbances and snoring. She was referred by Dr. Jordan for evaluation of her sleep disturbances and snoring.  She has experienced lifelong difficulty sleeping through the night unless using medication such as Nyquil or post-surgical medications. She describes herself as a 'very light sleeper.' Her sleep schedule involves going to bed between 6 and 7 PM and waking up at 4 AM for work. She sometimes falls asleep within minutes, other times it takes hours, and she wakes up several times during the night.  Her husband has informed her that she snores, and occasionally, he has had to wake her due to the snoring. She has experienced episodes of waking up gasping, which improved after starting asthma medication. She is currently using an inhaler, which is the third or fourth one she has tried, and it has helped with nocturnal symptoms.  She has a history of type 2 diabetes, chronic fatigue, high cholesterol, anxiety, and high blood pressure. She is on fluoxetine  and Abilify  for anxiety. She takes losartan  for high blood pressure and a vitamin D  supplement for deficiency. She is currently on Ozempic  for weight loss, having started at 0.25 mg and now on 0.5 mg. She has lost 10 pounds since September.  She has been diagnosed with a sebaceous cyst on her chest, which drained spontaneously. She recently saw a dermatologist for this issue.  No history of falling asleep while driving. She does not drink alcohol much and has not experienced sleep  abnormalities such as narcolepsy or REM sleep disorder.  Epworth score 0/24. No concern for narcolepsy.   Allergies  Allergen Reactions   Other Other (See Comments)    Catgut stitches causes water blisters     There is no immunization history on file for this patient.  Past Medical History:  Diagnosis Date   Allergy     Asthma    Depression    Diabetes mellitus 2006   GERD (gastroesophageal reflux disease)    Hypertension     Tobacco History: Social History   Tobacco Use  Smoking Status Former   Current packs/day: 0.00   Average packs/day: 0.5 packs/day for 4.0 years (2.0 ttl pk-yrs)   Types: Cigarettes   Start date: 02/20/2010   Quit date: 02/20/2014   Years since quitting: 10.5  Smokeless Tobacco Never   Counseling given: Not Answered   Outpatient Medications Prior to Visit  Medication Sig Dispense Refill   acetaminophen  (TYLENOL ) 325 MG tablet Take 2 tablets (650 mg total) by mouth every 4 (four) hours as needed for mild pain (or temp > 37.5 C (99.5 F)).     albuterol  (VENTOLIN  HFA) 108 (90 Base) MCG/ACT inhaler Inhale 2 puffs into the lungs every 6 (six) hours as needed for wheezing or shortness of breath. 18 g 2   ARIPiprazole  (ABILIFY ) 2 MG tablet Take 1 tablet (2 mg total) by mouth daily. 90 tablet 1   budesonide -glycopyrrolate -formoterol  (BREZTRI  AEROSPHERE) 160-9-4.8 MCG/ACT AERO inhaler Inhale 2 puffs into the lungs daily. 10.7 g 5  cyanocobalamin  (VITAMIN B12) 1000 MCG/ML injection Inject 1 ml into the muscle once a week for a month.  Then inject 1 ml into the muscle once a month thereafter. 1 mL 0   FASENRA  PEN 30 MG/ML prefilled autoinjector INJECT 1 PEN UNDER THE SKIN EVERY 8 WEEKS 1 mL 6   FLUoxetine  (PROZAC ) 40 MG capsule TAKE 1 CAPSULE (40 MG TOTAL) BY MOUTH DAILY. 30 capsule 0   fluticasone  (FLONASE ) 50 MCG/ACT nasal spray Place 2 sprays into both nostrils daily. 16 g 5   loratadine  (CLARITIN ) 10 MG tablet Take 1 tablet (10 mg total) by mouth daily. 90  tablet 1   losartan  (COZAAR ) 50 MG tablet TAKE 1 TABLET BY MOUTH EVERY DAY 90 tablet 1   montelukast  (SINGULAIR ) 10 MG tablet Take 1 tablet (10 mg total) by mouth at bedtime. 90 tablet 1   omeprazole  (PRILOSEC  OTC) 20 MG tablet Take 20 mg by mouth daily.     rosuvastatin  (CRESTOR ) 10 MG tablet Take 1 tablet (10 mg total) by mouth daily. 90 tablet 3   Semaglutide ,0.25 or 0.5MG /DOS, (OZEMPIC , 0.25 OR 0.5 MG/DOSE,) 2 MG/3ML SOPN Inject 0.25 mg into the skin once a week. 3 mL 3   Vitamin D , Ergocalciferol , (DRISDOL ) 1.25 MG (50000 UNIT) CAPS capsule Take 1 capsule (50,000 Units total) by mouth every 7 (seven) days for 12 doses. 12 capsule 0   Facility-Administered Medications Prior to Visit  Medication Dose Route Frequency Provider Last Rate Last Admin   Benralizumab  SOSY 30 mg  30 mg Subcutaneous Q28 days Gallagher, Joel Louis, MD   30 mg at 03/03/21 1532      Review of Systems  Review of Systems  Constitutional: Negative.   Respiratory: Negative.    Psychiatric/Behavioral:  Positive for sleep disturbance.      Physical Exam  BP 132/72   Pulse 75   Temp 97.6 F (36.4 C)   Ht 5' 2 (1.575 m) Comment: pt stated  Wt 206 lb 3.2 oz (93.5 kg)   SpO2 97% Comment: ra  BMI 37.71 kg/m  Physical Exam Constitutional:      Appearance: Normal appearance. She is well-developed.  HENT:     Head: Normocephalic and atraumatic.     Mouth/Throat:     Mouth: Mucous membranes are moist.     Pharynx: Oropharynx is clear.  Eyes:     Pupils: Pupils are equal, round, and reactive to light.  Cardiovascular:     Rate and Rhythm: Normal rate and regular rhythm.     Heart sounds: Normal heart sounds. No murmur heard. Pulmonary:     Effort: Pulmonary effort is normal. No respiratory distress.     Breath sounds: Normal breath sounds. No wheezing or rhonchi.  Musculoskeletal:        General: Normal range of motion.     Cervical back: Normal range of motion and neck supple.  Skin:    General: Skin  is warm and dry.     Findings: No erythema or rash.  Neurological:     General: No focal deficit present.     Mental Status: She is alert and oriented to person, place, and time. Mental status is at baseline.  Psychiatric:        Mood and Affect: Mood normal.        Behavior: Behavior normal.        Thought Content: Thought content normal.        Judgment: Judgment normal.     Lab Results:  CBC    Component Value Date/Time   WBC 9.6 02/15/2022 0505   RBC 4.50 02/15/2022 0505   HGB 13.5 02/15/2022 0505   HGB 14.8 02/01/2021 1150   HCT 41.2 02/15/2022 0505   HCT 44.6 02/01/2021 1150   PLT 288 02/15/2022 0505   PLT 335 02/01/2021 1150   MCV 91.6 02/15/2022 0505   MCV 90 02/01/2021 1150   MCH 30.0 02/15/2022 0505   MCHC 32.8 02/15/2022 0505   RDW 12.7 02/15/2022 0505   RDW 12.8 02/01/2021 1150   LYMPHSABS 2.8 02/14/2022 0841   LYMPHSABS 2.9 02/01/2021 1150   MONOABS 0.7 02/14/2022 0841   EOSABS 0.0 02/14/2022 0841   EOSABS 1.2 (H) 02/01/2021 1150   BASOSABS 0.0 02/14/2022 0841   BASOSABS 0.1 02/01/2021 1150    BMET    Component Value Date/Time   NA 138 07/25/2024 0847   K 4.1 07/25/2024 0847   CL 103 07/25/2024 0847   CO2 26 07/25/2024 0847   GLUCOSE 118 (H) 07/25/2024 0847   BUN 18 07/25/2024 0847   CREATININE 0.85 07/25/2024 0847   CALCIUM  9.7 07/25/2024 0847   GFRNONAA >60 02/15/2022 0902   GFRAA >90 11/12/2011 0952    BNP No results found for: BNP  ProBNP    Component Value Date/Time   PROBNP 36.0 01/25/2016 1546    Imaging: No results found.   Assessment & Plan:   Assessment and Plan Assessment & Plan Sleep disturbance with concern for obstructive sleep apnea Chronic sleep disturbance with symptoms of snoring, restless sleep, and daytime sleepiness. Husband reports witnessed apneas. Discussed risks of untreated OSA, including cardiac arrhythmias, stroke, pulmonary hypertension, and diabetes. Explained severity classification based on apnea  index per hour. Discussed potential interventions including lifestyle modifications, positional therapy, oral appliances, and CPAP. Weight loss can potentially reverse OSA if weight-related. - Ordered home sleep study to evaluate for obstructive sleep apnea. - Advised to avoid sleeping on back during sleep study. - Advised to avoid alcohol and non-prescription sedatives. - Discussed potential use of melatonin or Z-Quil for short-term use if needed. - Plan follow-up to discuss sleep study results and potential treatment options.  Obesity BMI over 30, contributing to potential obstructive sleep apnea. Currently on Ozempic  0.5 mg for weight loss, with recent dose increase from 0.25 mg. Weight loss can improve sleep apnea and overall health. - Continue Ozempic  with potential dose increase every four weeks if tolerated. - Encouraged weight loss to potentially improve sleep apnea.  Asthma Well-controlled with current triple therapy inhaler regimen. No recent nocturnal symptoms reported. - Continue current asthma management with Breztri    Almarie LELON Ferrari, NP 09/23/2024

## 2024-09-23 NOTE — Patient Instructions (Addendum)
  VISIT SUMMARY: Today, we discussed your ongoing sleep disturbances and snoring issues. We reviewed your history of difficulty sleeping, snoring, and episodes of waking up gasping. We also talked about your current medications and recent weight loss efforts. A home sleep study was ordered to evaluate for obstructive sleep apnea, and we discussed potential treatment options and lifestyle modifications.  YOUR PLAN: -SLEEP DISTURBANCE WITH CONCERN FOR OBSTRUCTIVE SLEEP APNEA: Obstructive sleep apnea (OSA) is a condition where the airway becomes blocked during sleep, causing breathing to stop and start repeatedly. We discussed the risks of untreated OSA and potential interventions such as lifestyle changes, positional therapy, oral appliances, and CPAP. A home sleep study was ordered to evaluate for OSA. Please avoid sleeping on your back during the study, and avoid alcohol and non-prescription sedatives. You may use melatonin or Z-Quil for short-term relief if needed. We will follow up to discuss the results and treatment options.  -OBESITY: Obesity is a condition where excess body fat may negatively affect health. It can contribute to obstructive sleep apnea. You are currently on Ozempic  for weight loss, and we encourage continued weight loss efforts as it can improve sleep apnea and overall health. Continue to increase Ozempic  dose every four weeks if tolerated with your PCP.   -ASTHMA: Asthma is a condition where the airways become inflamed and narrow, causing difficulty breathing. Your asthma is well-controlled with your current inhaler regimen, Breztri . Please continue with your current asthma management.  INSTRUCTIONS: Please complete the home sleep study as ordered and avoid sleeping on your back during the study. Avoid alcohol and non-prescription sedatives. Follow up to discuss the sleep study results and potential treatment options. Continue with your current medications and weight loss efforts.  We may increase your Ozempic  dose every four weeks if tolerated.  Follow-up: Call or send my chart message 2 weeks after turing in home sleep study for results, if positive we can schedule video visit to review and discuss treatment options further

## 2024-09-24 ENCOUNTER — Ambulatory Visit: Payer: Self-pay | Admitting: Family Medicine

## 2024-09-24 ENCOUNTER — Ambulatory Visit: Admitting: Family Medicine

## 2024-09-24 ENCOUNTER — Encounter: Payer: Self-pay | Admitting: Family Medicine

## 2024-09-24 VITALS — BP 128/74 | HR 79 | Temp 97.9°F | Resp 16 | Ht 62.0 in | Wt 206.6 lb

## 2024-09-24 DIAGNOSIS — E1169 Type 2 diabetes mellitus with other specified complication: Secondary | ICD-10-CM

## 2024-09-24 DIAGNOSIS — F330 Major depressive disorder, recurrent, mild: Secondary | ICD-10-CM | POA: Diagnosis not present

## 2024-09-24 DIAGNOSIS — I10 Essential (primary) hypertension: Secondary | ICD-10-CM

## 2024-09-24 DIAGNOSIS — Z1159 Encounter for screening for other viral diseases: Secondary | ICD-10-CM

## 2024-09-24 DIAGNOSIS — E785 Hyperlipidemia, unspecified: Secondary | ICD-10-CM

## 2024-09-24 DIAGNOSIS — F419 Anxiety disorder, unspecified: Secondary | ICD-10-CM | POA: Diagnosis not present

## 2024-09-24 LAB — LIPID PANEL
Cholesterol: 102 mg/dL (ref 0–200)
HDL: 49.5 mg/dL (ref >39.00)
LDL Cholesterol: 35 mg/dL (ref 0–99)
NonHDL: 52.05
Total CHOL/HDL Ratio: 2
Triglycerides: 83 mg/dL (ref 0.0–149.0)
VLDL: 16.6 mg/dL (ref 0.0–40.0)

## 2024-09-24 LAB — ALT: ALT: 21 U/L (ref 0–35)

## 2024-09-24 MED ORDER — OZEMPIC (0.25 OR 0.5 MG/DOSE) 2 MG/3ML ~~LOC~~ SOPN: 1.0000 mg | PEN_INJECTOR | SUBCUTANEOUS | Status: DC

## 2024-09-24 MED ORDER — LOSARTAN POTASSIUM 50 MG PO TABS: 50.0000 mg | ORAL_TABLET | Freq: Every day | ORAL | 2 refills | Status: AC

## 2024-09-24 NOTE — Assessment & Plan Note (Signed)
 She reports improvement after adding Abilify  2 mg daily. She is also on fluoxetine  40 mg daily. No changes today. Planning on establishing with psychiatrist.

## 2024-09-24 NOTE — Progress Notes (Signed)
 Chief Complaint  Patient presents with   Medical Management of Chronic Issues    Two month follow-up    Discussed the use of AI scribe software for clinical note transcription with the patient, who gave verbal consent to proceed.  History of Present Illness Terri Richardson is a 53 year old female with PMHx of asthma, hypertension, DM II, HLD, and anxiety who presents for follow-up on recent OV. She was last seen on 07/25/2024, when several health problems were addressed. Since her last visit she has seen pulmonologist to evaluate for OSA.  Planning on a home sleep study. She has also seen her immunologist to follow on seasonal allergy  rhinitis and asthma.  Anxiety and depression: Abilify  2 mg was added. She is also on fluoxetine  40 mg daily. Referral to psychiatrist was also placed.  States that she received a call but she has not called back.  She reports feeling more motivated and able to engage in daily activities since the addition of Abilify . No thoughts of self-harm.  She reports sleeping an average of six to eight hours per night.  She works at [employer] and has access to ten free therapy sessions through her workplace, which she plans to utilize for psychiatric support.      09/24/2024   12:46 PM 07/25/2024    7:58 AM 03/06/2023    3:23 PM 07/03/2022    3:36 PM 03/10/2022    8:03 AM  Depression screen PHQ 2/9  Decreased Interest 1 3 0 0 3  Down, Depressed, Hopeless 0 3 0 0 3  PHQ - 2 Score 1 6 0 0 6  Altered sleeping 1 3  3 3   Tired, decreased energy 2 0  3 3  Change in appetite 0 0  3 0  Feeling bad or failure about yourself  0 0  0 0  Trouble concentrating 0 0  0 0  Moving slowly or fidgety/restless 0 0  0 0  Suicidal thoughts 0 0  0 0  PHQ-9 Score 4 9   9  12    Difficult doing work/chores Not difficult at all Somewhat difficult  Somewhat difficult Extremely dIfficult     Data saved with a previous flowsheet row definition      09/24/2024   12:46 PM 07/25/2024     8:01 AM  GAD 7 : Generalized Anxiety Score  Nervous, Anxious, on Edge 0 2  Control/stop worrying 0 0  Worry too much - different things 0 0  Trouble relaxing 0 0  Restless 0 0  Easily annoyed or irritable 0 2  Afraid - awful might happen 0 0  Total GAD 7 Score 0 4  Anxiety Difficulty Not difficult at all Somewhat difficult   Hyperlipidemia: Last visit pravastatin  was changed to rosuvastatin  10 mg daily.  She is tolerating medication well.  Lab Results  Component Value Date   CHOL 221 (H) 07/25/2024   HDL 60.60 07/25/2024   LDLCALC 128 (H) 07/25/2024   TRIG 162.0 (H) 07/25/2024   CHOLHDL 4 07/25/2024   DM 2: Ozempic  started last visit. Initially, she experienced nausea but is now tolerating the medication well. She has increased the dose to 0.5 mg and reports improvement of blood sugar levels. She is in the process of finding an eye care provider, overdue eye exam.  Lab Results  Component Value Date   HGBA1C 7.0 (H) 07/25/2024   Review of Systems  Constitutional:  Negative for activity change, appetite change, chills and  fever.  HENT:  Negative for mouth sores and sore throat.   Respiratory:  Negative for shortness of breath.   Cardiovascular:  Negative for chest pain, palpitations and leg swelling.  Gastrointestinal:  Negative for abdominal pain, nausea and vomiting.  Endocrine: Negative for polydipsia, polyphagia and polyuria.  Skin:  Negative for rash.  Neurological:  Negative for syncope and weakness.  Psychiatric/Behavioral:  Negative for confusion and hallucinations.   See other pertinent positives and negatives in HPI.  Current Outpatient Medications on File Prior to Visit  Medication Sig Dispense Refill   acetaminophen  (TYLENOL ) 325 MG tablet Take 2 tablets (650 mg total) by mouth every 4 (four) hours as needed for mild pain (or temp > 37.5 C (99.5 F)).     albuterol  (VENTOLIN  HFA) 108 (90 Base) MCG/ACT inhaler Inhale 2 puffs into the lungs every 6 (six) hours  as needed for wheezing or shortness of breath. 18 g 2   ARIPiprazole  (ABILIFY ) 2 MG tablet Take 1 tablet (2 mg total) by mouth daily. 90 tablet 1   budesonide -glycopyrrolate -formoterol  (BREZTRI  AEROSPHERE) 160-9-4.8 MCG/ACT AERO inhaler Inhale 2 puffs into the lungs daily. 10.7 g 5   cyanocobalamin  (VITAMIN B12) 1000 MCG/ML injection Inject 1 ml into the muscle once a week for a month.  Then inject 1 ml into the muscle once a month thereafter. 1 mL 0   FASENRA  PEN 30 MG/ML prefilled autoinjector INJECT 1 PEN UNDER THE SKIN EVERY 8 WEEKS 1 mL 6   FLUoxetine  (PROZAC ) 40 MG capsule TAKE 1 CAPSULE (40 MG TOTAL) BY MOUTH DAILY. 30 capsule 0   fluticasone  (FLONASE ) 50 MCG/ACT nasal spray Place 2 sprays into both nostrils daily. 16 g 5   losartan  (COZAAR ) 50 MG tablet TAKE 1 TABLET BY MOUTH EVERY DAY 90 tablet 1   montelukast  (SINGULAIR ) 10 MG tablet Take 1 tablet (10 mg total) by mouth at bedtime. 90 tablet 1   omeprazole  (PRILOSEC  OTC) 20 MG tablet Take 20 mg by mouth daily.     rosuvastatin  (CRESTOR ) 10 MG tablet Take 1 tablet (10 mg total) by mouth daily. 90 tablet 3   Vitamin D , Ergocalciferol , (DRISDOL ) 1.25 MG (50000 UNIT) CAPS capsule Take 1 capsule (50,000 Units total) by mouth every 7 (seven) days for 12 doses. 12 capsule 0   loratadine  (CLARITIN ) 10 MG tablet Take 1 tablet (10 mg total) by mouth daily. 90 tablet 1   Current Facility-Administered Medications on File Prior to Visit  Medication Dose Route Frequency Provider Last Rate Last Admin   Benralizumab  SOSY 30 mg  30 mg Subcutaneous Q28 days Iva Marty Saltness, MD   30 mg at 03/03/21 1532    Past Medical History:  Diagnosis Date   Allergy     Asthma    Depression    Diabetes mellitus 2006   GERD (gastroesophageal reflux disease)    Hypertension    Allergies  Allergen Reactions   Other Other (See Comments)    Catgut stitches causes water blisters    Social History   Socioeconomic History   Marital status: Married     Spouse name: Not on file   Number of children: Not on file   Years of education: Not on file   Highest education level: Not on file  Occupational History   Occupation: Works at [employer]  Tobacco Use   Smoking status: Former    Current packs/day: 0.00    Average packs/day: 0.5 packs/day for 4.0 years (2.0 ttl pk-yrs)    Types:  Cigarettes    Start date: 02/20/2010    Quit date: 02/20/2014    Years since quitting: 10.6   Smokeless tobacco: Never  Vaping Use   Vaping status: Never Used  Substance and Sexual Activity   Alcohol use: No    Alcohol/week: 0.0 standard drinks of alcohol   Drug use: No   Sexual activity: Yes    Birth control/protection: None  Other Topics Concern   Not on file  Social History Narrative   Not on file   Social Drivers of Health   Financial Resource Strain: Not on file  Food Insecurity: Not on file  Transportation Needs: Not on file  Physical Activity: Not on file  Stress: Not on file  Social Connections: Not on file   Vitals:   09/24/24 0802  BP: 128/74  Pulse: 79  Resp: 16  Temp: 97.9 F (36.6 C)  SpO2: 97%   Body mass index is 37.79 kg/m.  Physical Exam Vitals and nursing note reviewed.  Constitutional:      General: She is not in acute distress.    Appearance: She is well-developed.  HENT:     Head: Normocephalic and atraumatic.  Eyes:     Conjunctiva/sclera: Conjunctivae normal.  Cardiovascular:     Rate and Rhythm: Normal rate and regular rhythm.     Pulses:          Posterior tibial pulses are 2+ on the right side and 2+ on the left side.     Heart sounds: No murmur heard. Pulmonary:     Effort: Pulmonary effort is normal. No respiratory distress.     Breath sounds: Normal breath sounds.  Abdominal:     Palpations: Abdomen is soft. There is no hepatomegaly or mass.     Tenderness: There is no abdominal tenderness.  Skin:    General: Skin is warm.     Findings: No erythema or rash.  Neurological:     General: No focal  deficit present.     Mental Status: She is alert and oriented to person, place, and time.     Cranial Nerves: No cranial nerve deficit.     Gait: Gait normal.  Psychiatric:        Mood and Affect: Mood and affect normal.   ASSESSMENT AND PLAN:  [name] was seen today for medical management of chronic issues.  Diagnoses and all orders for this visit: Orders Placed This Encounter  Procedures   Lipid panel   Hepatitis C antibody   ALT   Lab Results  Component Value Date   CHOL 102 09/24/2024   HDL 49.50 09/24/2024   LDLCALC 35 09/24/2024   TRIG 83.0 09/24/2024   CHOLHDL 2 09/24/2024   Hyperlipidemia associated with type 2 diabetes mellitus (HCC) Assessment & Plan: Last LDL 128 in 07/2024. Currently she is on rosuvastatin  10 mg daily. Further recommendation will be given according to lipid panel result.  Orders: -     Lipid panel; Future -     ALT; Future  Depression, major, recurrent, mild Assessment & Plan: She reports improvement after adding Abilify  2 mg daily. She is also on fluoxetine  40 mg daily. No changes today. Planning on establishing with psychiatrist.   Anxiety disorder, unspecified type Assessment & Plan: Problem has improved. Continue fluoxetine  40 mg daily. Planning on establishing with psychiatrist, she has a few sessions that are covered by employer.   Type 2 diabetes mellitus with other specified complication, without long-term current use of  insulin (HCC) Assessment & Plan: Currently she is on Ozempic  0.5 mg weekly, which she has tolerated well, reporting improvement in blood sugars. She is going to go ahead and increase Ozempic  to 1 mg weekly. She is due for eye exam, trying to establish with a new provider that accepts her health insurance. Continue appropriate footcare. I will see her back in 3 to 4 months.  Orders: -     Ozempic  (0.25 or 0.5 MG/DOSE); Inject 1 mg into the skin once a week.  Encounter for HCV screening test for  low risk patient -     Hepatitis C antibody; Future  Return in about 4 months (around 01/20/2025) for chronic problems.  Yarelie Hams G. Cashtyn Pouliot, MD  [facility]. [place] office.

## 2024-09-24 NOTE — Patient Instructions (Addendum)
 A few things to remember from today's visit:  Depression, major, recurrent, mild  Anxiety disorder, unspecified type  Type 2 diabetes mellitus with other specified complication, without long-term current use of insulin (HCC) - Plan: Semaglutide ,0.25 or 0.5MG /DOS, (OZEMPIC , 0.25 OR 0.5 MG/DOSE,) 2 MG/3ML SOPN  Hyperlipidemia associated with type 2 diabetes mellitus (HCC) - Plan: Lipid panel, ALT  Encounter for HCV screening test for low risk patient - Plan: Hepatitis C antibody Increase Ozempic  to 1 mg weekly.  Will see you back in March next year for follow up.  If you need refills for medications you take chronically, please call your pharmacy. Do not use My Chart to request refills or for acute issues that need immediate attention. If you send a my chart message, it may take a few days to be addressed, specially if I am not in the office.  Please be sure medication list is accurate. If a new problem present, please set up appointment sooner than planned today.

## 2024-09-24 NOTE — Assessment & Plan Note (Signed)
 Currently she is on Ozempic  0.5 mg weekly, which she has tolerated well, reporting improvement in blood sugars. She is going to go ahead and increase Ozempic  to 1 mg weekly. She is due for eye exam, trying to establish with a new provider that accepts her health insurance. Continue appropriate footcare. I will see her back in 3 to 4 months.

## 2024-09-24 NOTE — Assessment & Plan Note (Signed)
 Problem has improved. Continue fluoxetine  40 mg daily. Planning on establishing with psychiatrist, she has a few sessions that are covered by employer.

## 2024-09-24 NOTE — Assessment & Plan Note (Signed)
 Last LDL 128 in 07/2024. Currently she is on rosuvastatin  10 mg daily. Further recommendation will be given according to lipid panel result.

## 2024-09-26 ENCOUNTER — Other Ambulatory Visit: Payer: Self-pay | Admitting: Family Medicine

## 2024-09-26 ENCOUNTER — Telehealth: Payer: Self-pay

## 2024-09-26 DIAGNOSIS — E1169 Type 2 diabetes mellitus with other specified complication: Secondary | ICD-10-CM

## 2024-09-26 LAB — HEPATITIS C ANTIBODY: Hepatitis C Ab: NONREACTIVE

## 2024-09-26 MED ORDER — SEMAGLUTIDE (1 MG/DOSE) 4 MG/3ML ~~LOC~~ SOPN: 1.0000 mg | PEN_INJECTOR | SUBCUTANEOUS | 1 refills | Status: AC

## 2024-09-26 NOTE — Telephone Encounter (Signed)
 Copied from CRM #[id number]. Topic: Clinical - Medication Question >> Sep 26, 2024  3:16 PM Shardie S wrote: Reason for CRM: Patient requesting prescription for Semaglutide ,0.25 or 0.5MG /DOS, (OZEMPIC , 0.25 OR 0.5 MG/DOSE,) 2 MG/3ML SOPN. States she discussed dosage increase to 1mg  with PCP.   [id number] [address], [place] - [address] [place] [place] [address] Phone: [phone] Fax: [phone] Hours: Not open 24 hours

## 2024-09-26 NOTE — Telephone Encounter (Signed)
 I though I sent a refill with new dose, 1 mg weekly. I am sending it again. Thanks, BJ

## 2024-10-10 ENCOUNTER — Other Ambulatory Visit: Payer: Self-pay | Admitting: Family Medicine

## 2024-10-10 DIAGNOSIS — E559 Vitamin D deficiency, unspecified: Secondary | ICD-10-CM

## 2024-10-13 ENCOUNTER — Other Ambulatory Visit: Payer: Self-pay

## 2024-10-13 DIAGNOSIS — F419 Anxiety disorder, unspecified: Secondary | ICD-10-CM

## 2024-10-13 MED ORDER — FLUOXETINE HCL 40 MG PO CAPS: 40.0000 mg | ORAL_CAPSULE | Freq: Every day | ORAL | 0 refills | Status: AC

## 2025-02-18 ENCOUNTER — Ambulatory Visit: Admitting: Allergy & Immunology

## 2025-02-18 ENCOUNTER — Ambulatory Visit: Admitting: Family Medicine
